# Patient Record
Sex: Female | Born: 1935 | Race: White | Hispanic: No | Smoking: Never smoker
Health system: Southern US, Community
[De-identification: ages and names within clinical notes are randomized; demographics above are authoritative.]

## PROBLEM LIST (undated history)

## (undated) DIAGNOSIS — Q249 Congenital malformation of heart, unspecified: Secondary | ICD-10-CM

## (undated) DIAGNOSIS — F329 Major depressive disorder, single episode, unspecified: Secondary | ICD-10-CM

## (undated) DIAGNOSIS — R001 Bradycardia, unspecified: Secondary | ICD-10-CM

## (undated) DIAGNOSIS — R471 Dysarthria and anarthria: Secondary | ICD-10-CM

## (undated) DIAGNOSIS — M1990 Unspecified osteoarthritis, unspecified site: Secondary | ICD-10-CM

## (undated) DIAGNOSIS — F32A Depression, unspecified: Secondary | ICD-10-CM

## (undated) DIAGNOSIS — H3530 Unspecified macular degeneration: Secondary | ICD-10-CM

## (undated) DIAGNOSIS — F419 Anxiety disorder, unspecified: Secondary | ICD-10-CM

## (undated) DIAGNOSIS — E785 Hyperlipidemia, unspecified: Secondary | ICD-10-CM

## (undated) DIAGNOSIS — I639 Cerebral infarction, unspecified: Secondary | ICD-10-CM

## (undated) DIAGNOSIS — J309 Allergic rhinitis, unspecified: Secondary | ICD-10-CM

## (undated) DIAGNOSIS — R1314 Dysphagia, pharyngoesophageal phase: Secondary | ICD-10-CM

## (undated) DIAGNOSIS — I10 Essential (primary) hypertension: Secondary | ICD-10-CM

## (undated) DIAGNOSIS — E876 Hypokalemia: Secondary | ICD-10-CM

## (undated) DIAGNOSIS — M5430 Sciatica, unspecified side: Secondary | ICD-10-CM

## (undated) DIAGNOSIS — G319 Degenerative disease of nervous system, unspecified: Secondary | ICD-10-CM

## (undated) DIAGNOSIS — G459 Transient cerebral ischemic attack, unspecified: Secondary | ICD-10-CM

## (undated) DIAGNOSIS — R413 Other amnesia: Secondary | ICD-10-CM

## (undated) DIAGNOSIS — I440 Atrioventricular block, first degree: Secondary | ICD-10-CM

## (undated) DIAGNOSIS — I679 Cerebrovascular disease, unspecified: Secondary | ICD-10-CM

## (undated) HISTORY — DX: Allergic rhinitis, unspecified: J30.9

## (undated) HISTORY — DX: Sciatica, unspecified side: M54.30

## (undated) HISTORY — DX: Hyperlipidemia, unspecified: E78.5

## (undated) HISTORY — DX: Bradycardia, unspecified: R00.1

## (undated) HISTORY — DX: Essential (primary) hypertension: I10

## (undated) HISTORY — DX: Atrioventricular block, first degree: I44.0

## (undated) HISTORY — DX: Major depressive disorder, single episode, unspecified: F32.9

## (undated) HISTORY — DX: Dysarthria and anarthria: R47.1

## (undated) HISTORY — DX: Dysphagia, pharyngoesophageal phase: R13.14

## (undated) HISTORY — DX: Hypokalemia: E87.6

## (undated) HISTORY — DX: Unspecified osteoarthritis, unspecified site: M19.90

## (undated) HISTORY — DX: Cerebrovascular disease, unspecified: I67.9

## (undated) HISTORY — DX: Other amnesia: R41.3

## (undated) HISTORY — DX: Cerebral infarction, unspecified: I63.9

## (undated) HISTORY — DX: Transient cerebral ischemic attack, unspecified: G45.9

## (undated) HISTORY — DX: Degenerative disease of nervous system, unspecified: G31.9

## (undated) HISTORY — DX: Unspecified macular degeneration: H35.30

## (undated) HISTORY — DX: Depression, unspecified: F32.A

## (undated) HISTORY — DX: Anxiety disorder, unspecified: F41.9

## (undated) HISTORY — DX: Congenital malformation of heart, unspecified: Q24.9

---
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
## 1960-10-17 HISTORY — PX: ABDOMINAL HYSTERECTOMY: SHX81

## 2003-10-18 HISTORY — PX: COLONOSCOPY: SHX174

## 2011-12-16 HISTORY — PX: CATARACT EXTRACTION W/ INTRAOCULAR LENS IMPLANT: SHX1309

## 2012-06-11 ENCOUNTER — Encounter: Payer: Self-pay | Admitting: Family

## 2012-06-11 ENCOUNTER — Ambulatory Visit (INDEPENDENT_AMBULATORY_CARE_PROVIDER_SITE_OTHER): Payer: Medicare Other | Admitting: Family

## 2012-06-11 VITALS — BP 136/82 | HR 68 | Ht <= 58 in | Wt 150.0 lb

## 2012-06-11 DIAGNOSIS — I10 Essential (primary) hypertension: Secondary | ICD-10-CM

## 2012-06-11 DIAGNOSIS — E785 Hyperlipidemia, unspecified: Secondary | ICD-10-CM

## 2012-06-11 DIAGNOSIS — R609 Edema, unspecified: Secondary | ICD-10-CM

## 2012-06-11 LAB — CBC WITH DIFFERENTIAL/PLATELET
Basophils Absolute: 0 10*3/uL (ref 0.0–0.1)
HCT: 40.7 % (ref 36.0–46.0)
Lymphs Abs: 2.6 10*3/uL (ref 0.7–4.0)
Monocytes Relative: 6.5 % (ref 3.0–12.0)
Platelets: 173 10*3/uL (ref 150.0–400.0)
RDW: 14.1 % (ref 11.5–14.6)

## 2012-06-11 LAB — POCT URINALYSIS DIPSTICK
Bilirubin, UA: NEGATIVE
Ketones, UA: NEGATIVE
Protein, UA: NEGATIVE
Spec Grav, UA: <=1.005
pH, UA: 5.5

## 2012-06-11 LAB — COMPREHENSIVE METABOLIC PANEL
ALT: 16 U/L (ref 0–35)
BUN: 17 mg/dL (ref 6–23)
CO2: 22 mEq/L (ref 19–32)
Calcium: 9.5 mg/dL (ref 8.4–10.5)
Chloride: 109 mEq/L (ref 96–112)
Creatinine, Ser: 0.9 mg/dL (ref 0.4–1.2)
GFR: 63.34 mL/min (ref >60.00)
Glucose, Bld: 96 mg/dL (ref 70–99)
Total Bilirubin: 0.4 mg/dL (ref 0.3–1.2)

## 2012-06-11 NOTE — Progress Notes (Signed)
Subjective:    Patient ID: Alice Swanson, female    DOB: [birth year], 78 y.o.   MRN: [id number]  HPI 78 year old white female, nonsmoker, new patient to the practice is in to be established. She has recently relocated from [place] to be close to her family. She has a history of hypertension and hyperlipidemia. She currently takes amlodipine, losartan, and simvastatin. She's tolerating all of her medications well. She occasionally takes Claritin over-the-counter for allergies. Last blood work was done July 2013 and was normal. Her immunizations are up-to-date per patient, she is declining a mammogram, colonoscopy was about 5 years ago.  She has concerns today of swelling in her feet x1 month. Denies any increase in her sodium intake. Does not routinely monitor her sodium. She denies any chest pain, palpitations, shortness of breath or edema.   Review of Systems  Constitutional: Negative.   HENT: Negative.   Eyes: Negative.   Respiratory: Negative.  Negative for shortness of breath.   Cardiovascular: Positive for leg swelling. Negative for chest pain and palpitations.  Gastrointestinal: Negative.   Genitourinary: Negative.   Musculoskeletal: Negative.   Skin: Negative.   Neurological: Negative.   Hematological: Negative.   Psychiatric/Behavioral: Negative.    Past Medical History  Diagnosis Date  . Arthritis   . Depression   . Glaucoma   . Hyperlipidemia   . Hypertension   . Cardiac arrhythmia due to congenital heart disease     History   Social History  . Marital Status: Unknown    Spouse Name: N/A    Number of Children: N/A  . Years of Education: N/A   Occupational History  . Not on file.   Social History Main Topics  . Smoking status: Never Smoker   . Smokeless tobacco: Not on file  . Alcohol Use: No  . Drug Use: No  . Sexually Active: Not on file   Other Topics Concern  . Not on file   Social History Narrative  . No narrative on file    Past Surgical  History  Procedure Date  . Appendectomy   . Abdominal hysterectomy   . Tonsillectomy and adenoidectomy     No family history on file.  Allergies  Allergen Reactions  . Penicillins     Current Outpatient Prescriptions on File Prior to Visit  Medication Sig Dispense Refill  . amLODipine (NORVASC) 10 MG tablet Take 10 mg by mouth daily.      . Calcium Carbonate-Vitamin D (CALCIUM 600+D) 600-200 MG-UNIT TABS Take 1 tablet by mouth 2 (two) times daily.      Marland Kitchen loratadine (CLARITIN) 10 MG tablet Take 10 mg by mouth daily.      Marland Kitchen losartan (COZAAR) 100 MG tablet Take 100 mg by mouth daily.      . simvastatin (ZOCOR) 20 MG tablet Take 20 mg by mouth every evening.        BP 136/82  Pulse 68  Ht 4\' 10"  (1.473 m)  Wt 150 lb (68.04 kg)  BMI 31.35 kg/m2  SpO2 95%chart    Objective:   Physical Exam  Constitutional: She is oriented to person, place, and time. She appears well-developed and well-nourished.  HENT:  Right Ear: External ear normal.  Left Ear: External ear normal.  Nose: Nose normal.  Mouth/Throat: Oropharynx is clear and moist.  Neck: Normal range of motion. Neck supple. No thyromegaly present.  Cardiovascular: Normal rate, regular rhythm and normal heart sounds.   Pulmonary/Chest: Effort normal and breath sounds normal.  Abdominal: Soft. Bowel sounds are normal.  Musculoskeletal: Normal range of motion.  Neurological: She is alert and oriented to person, place, and time. She has normal reflexes.  Skin: Skin is warm and dry.  Psychiatric: She has a normal mood and affect.          Assessment & Plan:  Assessment: Hypertension, Hyperlipidemia, Peripheral Edema-likely related to sodium retention, allergic rhinitis  Plan: Lab sent to include UA, CMP, TSH notify patient pending results. Will return for a fasting appointment in 3-4 months and we'll check her cholesterol. Encouraged healthy diet, exercise, low sodium. We'll consider a trial of Lasix if her labs are  normal. Patient to call the office with any questions or concerns prior to her next office visit. Recheck a schedule and as discussed.

## 2012-06-11 NOTE — Patient Instructions (Addendum)
Peripheral Edema You have swelling in your legs (peripheral edema). This swelling is due to excess accumulation of salt and water in your body. Edema may be a sign of heart, kidney or liver disease, or a side effect of a medication. It may also be due to problems in the leg veins. Elevating your legs and using special support stockings may be very helpful, if the cause of the swelling is due to poor venous circulation. Avoid long periods of standing, whatever the cause. Treatment of edema depends on identifying the cause. Chips, pretzels, pickles and other salty foods should be avoided. Restricting salt in your diet is almost always needed. Water pills (diuretics) are often used to remove the excess salt and water from your body via urine. These medicines prevent the kidney from reabsorbing sodium. This increases urine flow. Diuretic treatment may also result in lowering of potassium levels in your body. Potassium supplements may be needed if you have to use diuretics daily. Daily weights can help you keep track of your progress in clearing your edema. You should call your caregiver for follow up care as recommended. SEEK IMMEDIATE MEDICAL CARE IF:   You have increased swelling, pain, redness, or heat in your legs.   You develop shortness of breath, especially when lying down.   You develop chest or abdominal pain, weakness, or fainting.   You have a fever.  Document Released: 11/10/2004 Document Revised: 09/22/2011 Document Reviewed: 10/21/2009 [facility] Patient Information 2012 [place], [place].  2 Gram Low Sodium Diet A 2 gram sodium diet restricts the amount of sodium in the diet to no more than 2 g or 2000 mg daily. Limiting the amount of sodium is often used to help lower blood pressure. It is important if you have heart, liver, or kidney problems. Many foods contain sodium for flavor and sometimes as a preservative. When the amount of sodium in a diet needs to be low, it is important to know  what to look for when choosing foods and drinks. The following includes some information and guidelines to help make it easier for you to adapt to a low sodium diet. QUICK TIPS  Do not add salt to food.   Avoid convenience items and fast food.   Choose unsalted snack foods.   Buy lower sodium products, often labeled as "lower sodium" or "no salt added."   Check food labels to learn how much sodium is in 1 serving.   When eating at a restaurant, ask that your food be prepared with less salt or none, if possible.  READING FOOD LABELS FOR SODIUM INFORMATION The nutrition facts label is a good place to find how much sodium is in foods. Look for products with no more than 500 to 600 mg of sodium per meal and no more than 150 mg per serving. Remember that 2 g = 2000 mg. The food label may also list foods as:  Sodium-free: Less than 5 mg in a serving.   Very low sodium: 35 mg or less in a serving.   Low-sodium: 140 mg or less in a serving.   Light in sodium: 50% less sodium in a serving. For example, if a food that usually has 300 mg of sodium is changed to become light in sodium, it will have 150 mg of sodium.   Reduced sodium: 25% less sodium in a serving. For example, if a food that usually has 400 mg of sodium is changed to reduced sodium, it will have 300 mg of  sodium.  CHOOSING FOODS Grains  Avoid: Salted crackers and snack items. Some cereals, including instant hot cereals. Bread stuffing and biscuit mixes. Seasoned rice or pasta mixes.   Choose: Unsalted snack items. Low-sodium cereals, oats, puffed wheat and rice, shredded wheat. English muffins and bread. Pasta.  Meats  Avoid: Salted, canned, smoked, spiced, pickled meats, including fish and poultry. Bacon, ham, sausage, cold cuts, hot dogs, anchovies.   Choose: Low-sodium canned tuna and salmon. Fresh or frozen meat, poultry, and fish.  Dairy  Avoid: Processed cheese and spreads. Cottage cheese. Buttermilk and condensed  milk. Regular cheese.   Choose: Milk. Low-sodium cottage cheese. Yogurt. Sour cream. Low-sodium cheese.  Fruits and Vegetables  Avoid: Regular canned vegetables. Regular canned tomato sauce and paste. Frozen vegetables in sauces. Olives. Rosita Fire. Relishes. Sauerkraut.   Choose: Low-sodium canned vegetables. Low-sodium tomato sauce and paste. Frozen or fresh vegetables. Fresh and frozen fruit.  Condiments  Avoid: Canned and packaged gravies. Worcestershire sauce. Tartar sauce. Barbecue sauce. Soy sauce. Steak sauce. Ketchup. Onion, garlic, and table salt. Meat flavorings and tenderizers.   Choose: Fresh and dried herbs and spices. Low-sodium varieties of mustard and ketchup. Lemon juice. Tabasco sauce. Horseradish.  SAMPLE 2 GRAM SODIUM MEAL PLAN Breakfast / Sodium (mg)  1 cup low-fat milk / 143 mg   2 slices whole-wheat toast / 270 mg   1 tbs heart-healthy margarine / 153 mg   1 hard-boiled egg / 139 mg   1 small orange / 0 mg  Lunch / Sodium (mg)  1 cup raw carrots / 76 mg    cup hummus / 298 mg   1 cup low-fat milk / 143 mg    cup red grapes / 2 mg   1 whole-wheat pita bread / 356 mg  Dinner / Sodium (mg)  1 cup whole-wheat pasta / 2 mg   1 cup low-sodium tomato sauce / 73 mg   3 oz lean ground beef / 57 mg   1 small side salad (1 cup raw spinach leaves,  cup cucumber,  cup yellow bell pepper) with 1 tsp olive oil and 1 tsp red wine vinegar / 25 mg  Snack / Sodium (mg)  1 container low-fat vanilla yogurt / 107 mg   3 graham cracker squares / 127 mg  Nutrient Analysis  Calories: 2033   Protein: 77 g   Carbohydrate: 282 g   Fat: 72 g   Sodium: 1971 mg  Document Released: 10/03/2005 Document Revised: 09/22/2011 Document Reviewed: 01/04/2010 [facility] Patient Information 2012 [place], [place].

## 2012-06-12 NOTE — Progress Notes (Signed)
Quick Note:    Letter mailed.  ______

## 2012-07-10 ENCOUNTER — Telehealth: Payer: Self-pay | Admitting: Family Medicine

## 2012-07-10 MED ORDER — LOSARTAN POTASSIUM 100 MG PO TABS: 100.0000 mg | ORAL_TABLET | Freq: Every day | ORAL | Status: DC

## 2012-07-10 NOTE — Telephone Encounter (Signed)
Pt needs refill on her losartan (COZAAR) 100 MG tablet Sent to [facility] on [address]. Pt is down to 3 pills. Thank you. Pt last seen 06/11/2012 as new pt.

## 2012-07-10 NOTE — Telephone Encounter (Signed)
Rx sent 

## 2012-08-01 ENCOUNTER — Telehealth: Payer: Self-pay | Admitting: Family

## 2012-08-01 NOTE — Telephone Encounter (Signed)
Patient calling to let MD know that she has resumed the Fluoxetine 10 mg po daily.  Feeling much better and will continue with the medication.  Would also like a psychiatric referral.  Has not established that since moving here from [place].

## 2012-08-01 NOTE — Telephone Encounter (Signed)
Caller: [name]/Patient; Patient Name: Alice Swanson; PCP: Adline Mango [facility]); Best Callback Phone Number: [phone]; Call regarding intermittent Depressed, onset 2 weeks.  Fluoxetine started on 10-14. Pt states, symptoms started after I moved from [place] and worsen after I went to a church service 2 weeks ago.  Pt complains of not sleeping, "gripping" in her stomach. Pt feels some better after starting Fluoxetine.  Pt is leaving for weekend trip on 10-17.  All emergent symptoms ruled out per Depression, see in 24 hrs due to increasing symptoms and taking medications as prescribed.  Pt would like Dr Orvan Falconer to know she is taking Fluoxetine again.  Feels she needs to see a Psychiatrist.  PLEASE REVIEW W/ MD AND HAVE HER FOLLOW UP W/ PT.

## 2012-08-02 NOTE — Telephone Encounter (Signed)
Pt aware that she needs to contact insurance company for a list of psychiatrists' covered by them

## 2012-08-09 ENCOUNTER — Ambulatory Visit: Payer: Medicare Other | Admitting: Family

## 2012-08-10 ENCOUNTER — Ambulatory Visit (INDEPENDENT_AMBULATORY_CARE_PROVIDER_SITE_OTHER): Payer: Medicare Other | Admitting: Family

## 2012-08-10 ENCOUNTER — Encounter: Payer: Self-pay | Admitting: Family

## 2012-08-10 VITALS — BP 120/82 | HR 107 | Temp 97.5°F | Wt 137.0 lb

## 2012-08-10 DIAGNOSIS — F419 Anxiety disorder, unspecified: Secondary | ICD-10-CM | POA: Insufficient documentation

## 2012-08-10 DIAGNOSIS — E7800 Pure hypercholesterolemia, unspecified: Secondary | ICD-10-CM

## 2012-08-10 DIAGNOSIS — J309 Allergic rhinitis, unspecified: Secondary | ICD-10-CM

## 2012-08-10 DIAGNOSIS — I10 Essential (primary) hypertension: Secondary | ICD-10-CM

## 2012-08-10 DIAGNOSIS — F329 Major depressive disorder, single episode, unspecified: Secondary | ICD-10-CM

## 2012-08-10 DIAGNOSIS — F32A Depression, unspecified: Secondary | ICD-10-CM | POA: Insufficient documentation

## 2012-08-10 DIAGNOSIS — F411 Generalized anxiety disorder: Secondary | ICD-10-CM

## 2012-08-10 MED ORDER — ESCITALOPRAM OXALATE 5 MG PO TABS: 5.0000 mg | ORAL_TABLET | Freq: Every day | ORAL | Status: DC

## 2012-08-10 NOTE — Progress Notes (Signed)
Subjective:    Patient ID: Alice Swanson, female    DOB: 1920/08/11, 78 y.o.   MRN: [id number]  HPI  78 year old white female, nonsmoker is in with complaints of depression. 2 months ago she was prescribed Prozac but she's been taken inconsistently. Patient reports that about 2 doses of the medication in the last 2 months. However, she's noticed herself being more sad, depressed, crying, losing weight. She's had a difficult time adjusting to being dependent on her children. Patient recently relocated here. 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 She has a appointment with ophthalmologist next month. Overall she just feels down. Has feelings of helplessness and hopelessness. She also has thoughts of death and dying but denies any intent to commit suicide.  Review of Systems  Constitutional: Negative.   Respiratory: Negative.   Cardiovascular: Negative.   Musculoskeletal: Negative.   Skin: Negative.   Neurological: Negative.   Hematological: Negative.   Psychiatric/Behavioral: Positive for disturbed wake/sleep cycle and agitation. Negative for suicidal ideas and self-injury. The patient is nervous/anxious.    Past Medical History  Diagnosis Date  . Arthritis   . Depression   . Glaucoma   . Hyperlipidemia   . Hypertension   . Cardiac arrhythmia due to congenital heart disease     History   Social History  . Marital Status: Unknown    Spouse Name: N/A    Number of Children: N/A  . Years of Education: N/A   Occupational History  . Not on file.   Social History Main Topics  . Smoking status: Never Smoker   . Smokeless tobacco: Not on file  . Alcohol Use: No  . Drug Use: No  . Sexually Active: Not on file   Other Topics Concern  . Not on file   Social History Narrative  . No narrative on file    Past Surgical History  Procedure Date  . Appendectomy   . Abdominal hysterectomy   . Tonsillectomy and  adenoidectomy     No family history on file.  Allergies  Allergen Reactions  . Penicillins     Current Outpatient Prescriptions on File Prior to Visit  Medication Sig Dispense Refill  . acetaminophen (TYLENOL) 325 MG tablet Take 650 mg by mouth as needed.      Marland Kitchen amLODipine (NORVASC) 10 MG tablet Take 10 mg by mouth daily.      Marland Kitchen aspirin 81 MG tablet Take 81 mg by mouth daily.      . Calcium Carbonate-Vitamin D (CALCIUM 600+D) 600-200 MG-UNIT TABS Take 1 tablet by mouth 2 (two) times daily.      Marland Kitchen latanoprost (XALATAN) 0.005 % ophthalmic solution Place 1 drop into both eyes at bedtime.      Marland Kitchen loratadine (CLARITIN) 10 MG tablet Take 10 mg by mouth daily.      Marland Kitchen losartan (COZAAR) 100 MG tablet Take 1 tablet (100 mg total) by mouth daily.  90 tablet  0  . polyethylene glycol (MIRALAX / GLYCOLAX) packet Take 17 g by mouth as needed.      . simvastatin (ZOCOR) 20 MG tablet Take 20 mg by mouth every evening.      . escitalopram (LEXAPRO) 5 MG tablet Take 1 tablet (5 mg total) by mouth daily.  30 tablet  3    BP 120/82  Pulse 107  Temp 97.5 F (36.4 C) (Oral)  Wt 137 lb (62.143  kg)  SpO2 98%chart    Objective:   Physical Exam  Constitutional: She is oriented to person, place, and time. She appears well-developed and well-nourished.  Cardiovascular: Normal rate, regular rhythm and normal heart sounds.   Pulmonary/Chest: Effort normal and breath sounds normal.  Neurological: She is alert and oriented to person, place, and time.  Skin: Skin is warm and dry.  Psychiatric:       Tearful, flat affect          Assessment & Plan:  Assessment: Depression, anxiety, hypertension   Plan: DC Prozac. Start Lexapro 5 mg once daily. Encouraged psychotherapy with Judithe Modest. Patient to call the office if symptoms worsen or persist. Will recheck in 2-3 weeks and sooner when necessary. Encouraged medication compliance.

## 2012-08-10 NOTE — Patient Instructions (Addendum)
Judithe Modest: contact for psychotherapy  Depression, Adult Depression refers to feeling sad, low, down in the dumps, blue, gloomy, or empty. In general, there are two kinds of depression: 1. Depression that we all experience from time to time because of upsetting life experiences, including the loss of a job or the ending of a relationship (normal sadness or normal grief). This kind of depression is considered normal, is short lived, and resolves within a few days to 2 weeks. (Depression experienced after the loss of a loved one is called bereavement. Bereavement often lasts longer than 2 weeks but normally gets better with time.) 2. Clinical depression, which lasts longer than normal sadness or normal grief or interferes with your ability to function at home, at work, and in school. It also interferes with your personal relationships. It affects almost every aspect of your life. Clinical depression is an illness. Symptoms of depression also can be caused by conditions other than normal sadness and grief or clinical depression. Examples of these conditions are listed as follows:  Physical illness Some physical illnesses, including underactive thyroid gland (hypothyroidism), severe anemia, specific types of cancer, diabetes, uncontrolled seizures, heart and lung problems, strokes, and chronic pain are commonly associated with symptoms of depression.  Side effects of some prescription medicine In some people, certain types of prescription medicine can cause symptoms of depression.  Substance abuse Abuse of alcohol and illicit drugs can cause symptoms of depression. SYMPTOMS Symptoms of normal sadness and normal grief include the following:  Feeling sad or crying for short periods of time.  Not caring about anything (apathy).  Difficulty sleeping or sleeping too much.  No longer able to enjoy the things you used to enjoy.  Desire to be by oneself all the time (social isolation).  Lack of energy  or motivation.  Difficulty concentrating or remembering.  Change in appetite or weight.  Restlessness or agitation. Symptoms of clinical depression include the same symptoms of normal sadness or normal grief and also the following symptoms:  Feeling sad or crying all the time.  Feelings of guilt or worthlessness.  Feelings of hopelessness or helplessness.  Thoughts of suicide or the desire to harm yourself (suicidal ideation).  Loss of touch with reality (psychotic symptoms). Seeing or hearing things that are not real (hallucinations) or having false beliefs about your life or the people around you (delusions and paranoia). DIAGNOSIS  The diagnosis of clinical depression usually is based on the severity and duration of the symptoms. Your caregiver also will ask you questions about your medical history and substance use to find out if physical illness, use of prescription medicine, or substance abuse is causing your depression. Your caregiver also may order blood tests. TREATMENT  Typically, normal sadness and normal grief do not require treatment. However, sometimes antidepressant medicine is prescribed for bereavement to ease the depressive symptoms until they resolve. The treatment for clinical depression depends on the severity of your symptoms but typically includes antidepressant medicine, counseling with a mental health professional, or a combination of both. Your caregiver will help to determine what treatment is best for you. Depression caused by physical illness usually goes away with appropriate medical treatment of the illness. If prescription medicine is causing depression, talk with your caregiver about stopping the medicine, decreasing the dose, or substituting another medicine. Depression caused by abuse of alcohol or illicit drugs abuse goes away with abstinence from these substances. Some adults need professional help in order to stop drinking or using drugs. SEEK  IMMEDIATE  CARE IF:  You have thoughts about hurting yourself or others.  You lose touch with reality (have psychotic symptoms).  You are taking medicine for depression and have a serious side effect. FOR MORE INFORMATION National [employer] on Mental Illness: www.nami.[employer]: http://www.maynard.net/ Document Released: 09/30/2000 Document Revised: 04/03/2012 Document Reviewed: 01/02/2012 [facility] Patient Information 2013 [place], [place].

## 2012-08-13 ENCOUNTER — Ambulatory Visit: Payer: Medicare Other | Admitting: Licensed Clinical Social Worker

## 2012-08-17 ENCOUNTER — Ambulatory Visit (INDEPENDENT_AMBULATORY_CARE_PROVIDER_SITE_OTHER): Payer: Medicare Other | Admitting: Licensed Clinical Social Worker

## 2012-08-17 DIAGNOSIS — F331 Major depressive disorder, recurrent, moderate: Secondary | ICD-10-CM

## 2012-08-24 ENCOUNTER — Ambulatory Visit (INDEPENDENT_AMBULATORY_CARE_PROVIDER_SITE_OTHER): Payer: Medicare Other | Admitting: Family

## 2012-08-24 ENCOUNTER — Encounter: Payer: Self-pay | Admitting: Family

## 2012-08-24 VITALS — BP 112/68 | HR 100 | Temp 99.2°F | Wt 134.0 lb

## 2012-08-24 DIAGNOSIS — F329 Major depressive disorder, single episode, unspecified: Secondary | ICD-10-CM

## 2012-08-24 DIAGNOSIS — F411 Generalized anxiety disorder: Secondary | ICD-10-CM

## 2012-08-24 DIAGNOSIS — F419 Anxiety disorder, unspecified: Secondary | ICD-10-CM

## 2012-08-24 DIAGNOSIS — K5900 Constipation, unspecified: Secondary | ICD-10-CM

## 2012-08-24 MED ORDER — ESCITALOPRAM OXALATE 10 MG PO TABS: 10.0000 mg | ORAL_TABLET | Freq: Every day | ORAL | Status: DC

## 2012-08-24 NOTE — Patient Instructions (Signed)

## 2012-08-24 NOTE — Progress Notes (Signed)
Subjective:    Patient ID: Alice Swanson, female    DOB: 02-12-1920, 78 y.o.   MRN: [id number]  HPI 78 year female, nonsmoker, is in today for a recheck of depression and anxiety. Overall, her mood has improved. Continues to have down days and feelings of sadness. Her appetite is decreased still, but slightly improved. Daughter believes she could use an increase in her Lexapro. Has feelings of helplessness and hopelessness. Has thoughts of dying related to her elderly age. But denies any intention or desire to commit suicide.   Has concerns of constipation. Reports a bowel movement approximately every other day. Has been drinking prune juice that was once effective, but no longer he is. She denies any blood in her stools are dark black stools. No palpable pain.   Review of Systems  Constitutional: Positive for appetite change and fatigue.  Respiratory: Negative.  Negative for shortness of breath.   Cardiovascular: Negative.  Negative for chest pain.  Gastrointestinal: Positive for constipation. Negative for nausea, vomiting, abdominal pain, diarrhea and anal bleeding.  Genitourinary: Negative.   Musculoskeletal: Negative.   Skin: Negative.   Neurological: Negative.   Hematological: Negative.   Psychiatric/Behavioral: Negative for suicidal ideas and confusion. The patient is nervous/anxious.    Past Medical History  Diagnosis Date  . Arthritis   . Depression   . Glaucoma   . Hyperlipidemia   . Hypertension   . Cardiac arrhythmia due to congenital heart disease     History   Social History  . Marital Status: Unknown    Spouse Name: N/A    Number of Children: N/A  . Years of Education: N/A   Occupational History  . Not on file.   Social History Main Topics  . Smoking status: Never Smoker   . Smokeless tobacco: Not on file  . Alcohol Use: No  . Drug Use: No  . Sexually Active: Not on file   Other Topics Concern  . Not on file   Social History Narrative  . No narrative  on file    Past Surgical History  Procedure Date  . Appendectomy   . Abdominal hysterectomy   . Tonsillectomy and adenoidectomy     No family history on file.  Allergies  Allergen Reactions  . Penicillins     Current Outpatient Prescriptions on File Prior to Visit  Medication Sig Dispense Refill  . acetaminophen (TYLENOL) 325 MG tablet Take 650 mg by mouth as needed.      Marland Kitchen amLODipine (NORVASC) 10 MG tablet Take 10 mg by mouth daily.      Marland Kitchen aspirin 81 MG tablet Take 81 mg by mouth daily.      . Calcium Carbonate-Vitamin D (CALCIUM 600+D) 600-200 MG-UNIT TABS Take 1 tablet by mouth 2 (two) times daily.      Marland Kitchen latanoprost (XALATAN) 0.005 % ophthalmic solution Place 1 drop into both eyes at bedtime.      Marland Kitchen loratadine (CLARITIN) 10 MG tablet Take 10 mg by mouth daily.      Marland Kitchen losartan (COZAAR) 100 MG tablet Take 1 tablet (100 mg total) by mouth daily.  90 tablet  0  . polyethylene glycol (MIRALAX / GLYCOLAX) packet Take 17 g by mouth as needed.      . simvastatin (ZOCOR) 20 MG tablet Take 20 mg by mouth every evening.      . [DISCONTINUED] escitalopram (LEXAPRO) 5 MG tablet Take 1 tablet (5 mg total) by mouth daily.  30 tablet  3  BP 112/68  Pulse 100  Temp 99.2 F (37.3 C) (Oral)  Wt 134 lb (60.782 kg)  SpO2 98%chart    Objective:   Physical Exam  Constitutional: She is oriented to person, place, and time. She appears well-developed and well-nourished.  HENT:  Right Ear: External ear normal.  Left Ear: External ear normal.  Mouth/Throat: Oropharynx is clear and moist.  Neck: Normal range of motion. Neck supple. No thyromegaly present.  Cardiovascular: Normal rate, regular rhythm and normal heart sounds.   Pulmonary/Chest: Effort normal and breath sounds normal.  Abdominal: Soft. Bowel sounds are normal. There is no tenderness. There is no rebound and no guarding.  Musculoskeletal: Normal range of motion.  Neurological: She is alert and oriented to person, place, and  time.  Skin: Skin is warm and dry.  Psychiatric: She has a normal mood and affect.          Assessment & Plan:  Assessment: Depression, Anxiety, Decreased Appetite, and constipation  Plan: Increase Lexapro to 10mg  a day. Encouraged her to get up and out of the bed daily, get a shower. Home health suggested and family will think about it. MiraLax daily to help with constipation. Increase fluid intake. Increase mobility. Patient call the office if symptoms worsen or persist. Recheck a schedule, when necessary.

## 2012-08-27 ENCOUNTER — Ambulatory Visit: Payer: Medicare Other | Admitting: Licensed Clinical Social Worker

## 2012-09-10 ENCOUNTER — Ambulatory Visit: Payer: Medicare Other | Admitting: Family

## 2012-09-17 ENCOUNTER — Encounter: Payer: Self-pay | Admitting: Family

## 2012-09-17 ENCOUNTER — Ambulatory Visit (INDEPENDENT_AMBULATORY_CARE_PROVIDER_SITE_OTHER): Payer: Medicare Other | Admitting: Family

## 2012-09-17 VITALS — BP 110/60 | HR 99 | Temp 97.9°F | Wt 134.0 lb

## 2012-09-17 DIAGNOSIS — F329 Major depressive disorder, single episode, unspecified: Secondary | ICD-10-CM

## 2012-09-17 DIAGNOSIS — F419 Anxiety disorder, unspecified: Secondary | ICD-10-CM

## 2012-09-17 DIAGNOSIS — F411 Generalized anxiety disorder: Secondary | ICD-10-CM

## 2012-09-17 MED ORDER — FLUOXETINE HCL 10 MG PO TABS: 10.0000 mg | ORAL_TABLET | Freq: Every day | ORAL | Status: DC

## 2012-09-17 NOTE — Progress Notes (Signed)
Subjective:    Patient ID: Alice Swanson, female    DOB: 13-Sep-1920, 78 y.o.   MRN: [id number]  HPI 78 year old white female, nonsmoker, is in for recheck of anxiety and depression. She is to take a Lexapro 10 mg once daily. Her depression has improved but continues to have anxiety. She continues to be in despair regarding her move to [facility] and getting older. She feels like a burden on her family.    Review of Systems  Constitutional: Negative.   Respiratory: Negative.   Cardiovascular: Negative.   Skin: Negative.   Psychiatric/Behavioral: Negative for suicidal ideas, sleep disturbance and self-injury. The patient is nervous/anxious.    Past Medical History  Diagnosis Date  . Arthritis   . Depression   . Glaucoma   . Hyperlipidemia   . Hypertension   . Cardiac arrhythmia due to congenital heart disease     History   Social History  . Marital Status: Unknown    Spouse Name: N/A    Number of Children: N/A  . Years of Education: N/A   Occupational History  . Not on file.   Social History Main Topics  . Smoking status: Never Smoker   . Smokeless tobacco: Not on file  . Alcohol Use: No  . Drug Use: No  . Sexually Active: Not on file   Other Topics Concern  . Not on file   Social History Narrative  . No narrative on file    Past Surgical History  Procedure Date  . Appendectomy   . Abdominal hysterectomy   . Tonsillectomy and adenoidectomy     No family history on file.  Allergies  Allergen Reactions  . Penicillins     Current Outpatient Prescriptions on File Prior to Visit  Medication Sig Dispense Refill  . acetaminophen (TYLENOL) 325 MG tablet Take 650 mg by mouth as needed.      Marland Kitchen amLODipine (NORVASC) 10 MG tablet Take 10 mg by mouth daily.      Marland Kitchen aspirin 81 MG tablet Take 81 mg by mouth daily.      . Calcium Carbonate-Vitamin D (CALCIUM 600+D) 600-200 MG-UNIT TABS Take 1 tablet by mouth 2 (two) times daily.      Marland Kitchen latanoprost (XALATAN) 0.005 % ophthalmic  solution Place 1 drop into both eyes at bedtime.      Marland Kitchen loratadine (CLARITIN) 10 MG tablet Take 10 mg by mouth daily.      Marland Kitchen losartan (COZAAR) 100 MG tablet Take 1 tablet (100 mg total) by mouth daily.  90 tablet  0  . polyethylene glycol (MIRALAX / GLYCOLAX) packet Take 17 g by mouth as needed.      . simvastatin (ZOCOR) 20 MG tablet Take 20 mg by mouth every evening.        BP 110/60  Pulse 99  Temp 97.9 F (36.6 C) (Oral)  Wt 134 lb (60.782 kg)  SpO2 92%chart    Objective:   Physical Exam  Constitutional: She appears well-developed and well-nourished.  HENT:  Right Ear: External ear normal.  Left Ear: External ear normal.  Nose: Nose normal.  Mouth/Throat: Oropharynx is clear and moist.  Neck: Normal range of motion. Neck supple. No thyromegaly present.  Cardiovascular: Normal rate and normal heart sounds.   Pulmonary/Chest: Effort normal and breath sounds normal.  Abdominal: Bowel sounds are normal.  Musculoskeletal: Normal range of motion.  Neurological: She is alert.  Skin: Skin is warm and dry.  Psychiatric: She has a normal mood and  affect.          Assessment & Plan:  Assessment: Anxiety and depression  Plan: DC Lexapro start fluoxetine 10 mg once daily. At next OV will obtain fasting labs. Call the office if symptoms worsen or persist. Recheck as scheduled.

## 2012-10-01 ENCOUNTER — Encounter (INDEPENDENT_AMBULATORY_CARE_PROVIDER_SITE_OTHER): Payer: Medicare Other | Admitting: Ophthalmology

## 2012-10-01 DIAGNOSIS — H3530 Unspecified macular degeneration: Secondary | ICD-10-CM

## 2012-10-01 DIAGNOSIS — H2510 Age-related nuclear cataract, unspecified eye: Secondary | ICD-10-CM

## 2012-10-01 DIAGNOSIS — H35039 Hypertensive retinopathy, unspecified eye: Secondary | ICD-10-CM

## 2012-10-01 DIAGNOSIS — I10 Essential (primary) hypertension: Secondary | ICD-10-CM

## 2012-10-01 DIAGNOSIS — H43819 Vitreous degeneration, unspecified eye: Secondary | ICD-10-CM

## 2012-10-03 ENCOUNTER — Telehealth: Payer: Self-pay | Admitting: Family

## 2012-10-03 MED ORDER — AMLODIPINE BESYLATE 10 MG PO TABS: 10.0000 mg | ORAL_TABLET | Freq: Every day | ORAL | Status: DC

## 2012-10-03 NOTE — Telephone Encounter (Signed)
Patient's daughter called stating that her mom needs her amlodipine 10mg  1poqd sent to [facility] on [address]. Please assist.

## 2012-10-03 NOTE — Telephone Encounter (Signed)
Done

## 2012-10-19 ENCOUNTER — Encounter: Payer: Self-pay | Admitting: Family

## 2012-10-19 ENCOUNTER — Ambulatory Visit (INDEPENDENT_AMBULATORY_CARE_PROVIDER_SITE_OTHER): Payer: Medicare Other | Admitting: Family

## 2012-10-19 ENCOUNTER — Ambulatory Visit: Payer: Medicare Other | Admitting: Family

## 2012-10-19 VITALS — BP 112/76 | HR 84 | Wt 132.0 lb

## 2012-10-19 DIAGNOSIS — E7800 Pure hypercholesterolemia, unspecified: Secondary | ICD-10-CM

## 2012-10-19 DIAGNOSIS — F329 Major depressive disorder, single episode, unspecified: Secondary | ICD-10-CM

## 2012-10-19 DIAGNOSIS — R413 Other amnesia: Secondary | ICD-10-CM

## 2012-10-19 LAB — CBC WITH DIFFERENTIAL/PLATELET
Eosinophils Relative: 1.3 % (ref 0.0–5.0)
HCT: 42.7 % (ref 36.0–46.0)
Hemoglobin: 14.2 g/dL (ref 12.0–15.0)
Lymphs Abs: 2.3 10*3/uL (ref 0.7–4.0)
MCV: 94.9 fl (ref 78.0–100.0)
Monocytes Absolute: 0.5 10*3/uL (ref 0.1–1.0)
Monocytes Relative: 5.2 % (ref 3.0–12.0)
Neutro Abs: 7 10*3/uL (ref 1.4–7.7)
Platelets: 202 10*3/uL (ref 150.0–400.0)
WBC: 10 10*3/uL (ref 4.5–10.5)

## 2012-10-19 LAB — HEPATIC FUNCTION PANEL
AST: 21 U/L (ref 0–37)
Albumin: 3.7 g/dL (ref 3.5–5.2)
Alkaline Phosphatase: 82 U/L (ref 39–117)
Total Bilirubin: 1 mg/dL (ref 0.3–1.2)

## 2012-10-19 LAB — BASIC METABOLIC PANEL
CO2: 24 mEq/L (ref 19–32)
Chloride: 106 mEq/L (ref 96–112)
Creatinine, Ser: 0.8 mg/dL (ref 0.4–1.2)
Sodium: 139 mEq/L (ref 135–145)

## 2012-10-19 LAB — TSH: TSH: 0.82 u[IU]/mL (ref 0.35–5.50)

## 2012-10-19 MED ORDER — FLUOXETINE HCL 10 MG PO TABS: 10.0000 mg | ORAL_TABLET | Freq: Every day | ORAL | Status: DC

## 2012-10-19 NOTE — Patient Instructions (Addendum)

## 2012-10-19 NOTE — Progress Notes (Signed)
Subjective:    Patient ID: Alice Swanson, female    DOB: 1919/12/27, 79 y.o.   MRN: [id number]  HPI 79 year old white female, nonsmoker is in for recheck of depression. She is currently on Prozac 10 mg once daily. Has less crying spells and is happier but continues to feel down as of the time. She is requesting that her family put her in an assisted living facility that would make her happier. Therefore, that is the plan. Reports having more dizziness recently. Her daughter has decreased her amlodipine from 10 mg to 5 mg but she continues to be dizzy. Blood pressure readings in the low teens systolically. Denies any chest pain, palpitations, shortness of breath or edema. Denies any feelings of helplessness, hopelessness.   Review of Systems  Constitutional: Negative.   HENT: Negative.   Respiratory: Negative.   Cardiovascular: Negative.   Gastrointestinal: Negative.   Musculoskeletal: Negative.   Skin: Negative.   Neurological: Negative.   Hematological: Negative.   Psychiatric/Behavioral: Negative for suicidal ideas and sleep disturbance. The patient is nervous/anxious.    Past Medical History  Diagnosis Date  . Arthritis   . Depression   . Glaucoma   . Hyperlipidemia   . Hypertension   . Cardiac arrhythmia due to congenital heart disease     History   Social History  . Marital Status: Unknown    Spouse Name: N/A    Number of Children: N/A  . Years of Education: N/A   Occupational History  . Not on file.   Social History Main Topics  . Smoking status: Never Smoker   . Smokeless tobacco: Not on file  . Alcohol Use: No  . Drug Use: No  . Sexually Active: Not on file   Other Topics Concern  . Not on file   Social History Narrative  . No narrative on file    Past Surgical History  Procedure Date  . Appendectomy   . Abdominal hysterectomy   . Tonsillectomy and adenoidectomy     No family history on file.  Allergies  Allergen Reactions  . Penicillins      Current Outpatient Prescriptions on File Prior to Visit  Medication Sig Dispense Refill  . acetaminophen (TYLENOL) 325 MG tablet Take 650 mg by mouth as needed.      Marland Kitchen amLODipine (NORVASC) 10 MG tablet Take 1 tablet (10 mg total) by mouth daily.  90 tablet  0  . aspirin 81 MG tablet Take 81 mg by mouth daily.      . Calcium Carbonate-Vitamin D (CALCIUM 600+D) 600-200 MG-UNIT TABS Take 1 tablet by mouth 2 (two) times daily.      Marland Kitchen latanoprost (XALATAN) 0.005 % ophthalmic solution Place 1 drop into both eyes at bedtime.      Marland Kitchen loratadine (CLARITIN) 10 MG tablet Take 10 mg by mouth daily.      Marland Kitchen losartan (COZAAR) 100 MG tablet Take 1 tablet (100 mg total) by mouth daily.  90 tablet  0  . polyethylene glycol (MIRALAX / GLYCOLAX) packet Take 17 g by mouth as needed.      . simvastatin (ZOCOR) 20 MG tablet Take 20 mg by mouth every evening.        BP 112/76  Pulse 84  Wt 132 lb (59.875 kg)  SpO2 98%chart    Objective:   Physical Exam  Constitutional: She is oriented to person, place, and time. She appears well-developed and well-nourished.  Neck: Normal range of motion. Neck supple.  Cardiovascular:  Normal rate, regular rhythm and normal heart sounds.   Pulmonary/Chest: Effort normal and breath sounds normal.  Abdominal: Soft. Bowel sounds are normal.  Neurological: She is alert and oriented to person, place, and time.  Skin: Skin is warm and dry.          Assessment & Plan:  Assessment: Hypotension, vertigo, depression  Plan: DC amlodipine. Continue current medications. Increase fluoxetine to 20 mg once daily. Recheck patient in one month and sooner when necessary. was in

## 2012-10-24 ENCOUNTER — Other Ambulatory Visit (INDEPENDENT_AMBULATORY_CARE_PROVIDER_SITE_OTHER): Payer: Medicare Other

## 2012-10-24 ENCOUNTER — Other Ambulatory Visit: Payer: Medicare Other

## 2012-10-24 DIAGNOSIS — E7800 Pure hypercholesterolemia, unspecified: Secondary | ICD-10-CM

## 2012-10-24 LAB — LIPID PANEL
HDL: 50.9 mg/dL (ref >39.00)
Total CHOL/HDL Ratio: 4
Triglycerides: 127 mg/dL (ref 0.0–149.0)
VLDL: 25.4 mg/dL (ref 0.0–40.0)

## 2012-10-25 LAB — LDL CHOLESTEROL, DIRECT: Direct LDL: 130.7 mg/dL

## 2012-10-31 ENCOUNTER — Other Ambulatory Visit: Payer: Self-pay | Admitting: Family

## 2012-10-31 NOTE — Telephone Encounter (Signed)
Pt is now taking fluoxetine 20 mg. This med was increase on 10-19-2012. [facility] [address]. Pt daughter saw NP in hall way in our office on 10-19-2012 and per daughter NP told her it's  Ok to increase from 10 mg to 20 mg. Daughter also needs blood work results.

## 2012-11-01 ENCOUNTER — Other Ambulatory Visit: Payer: Self-pay | Admitting: Family

## 2012-11-01 MED ORDER — FLUOXETINE HCL 20 MG PO TABS: 20.0000 mg | ORAL_TABLET | Freq: Every day | ORAL | Status: DC

## 2012-11-01 NOTE — Telephone Encounter (Signed)
Pt's daughter aware and rx sent to pharmacy

## 2013-01-04 ENCOUNTER — Ambulatory Visit (INDEPENDENT_AMBULATORY_CARE_PROVIDER_SITE_OTHER): Payer: Medicare Other

## 2013-01-04 DIAGNOSIS — Z111 Encounter for screening for respiratory tuberculosis: Secondary | ICD-10-CM

## 2013-01-04 DIAGNOSIS — A35 Other tetanus: Secondary | ICD-10-CM

## 2013-01-04 DIAGNOSIS — Z23 Encounter for immunization: Secondary | ICD-10-CM

## 2013-01-07 ENCOUNTER — Telehealth: Payer: Self-pay | Admitting: Family

## 2013-01-07 LAB — TB SKIN TEST
Induration: 0 mm
TB Skin Test: NEGATIVE

## 2013-01-07 NOTE — Telephone Encounter (Signed)
Pt's daughter concerned about assisted living box being checked on pt's form. Pt is going into independent living not assisted living. Advised that we can correct it if she will bring it back to the office

## 2013-01-07 NOTE — Telephone Encounter (Signed)
Patient's daughter called stating that she would like a call back from the nurse concerning form that was completed. Her office number is [phone]. Please assist.

## 2013-01-08 ENCOUNTER — Telehealth: Payer: Self-pay | Admitting: Family

## 2013-01-08 NOTE — Telephone Encounter (Addendum)
Pt daughter [name] is requesting Bland Span to return her call concerning a form.

## 2013-01-08 NOTE — Telephone Encounter (Signed)
Pt's daughter aware forms ready for pick up 

## 2013-02-09 ENCOUNTER — Other Ambulatory Visit: Payer: Self-pay | Admitting: Family

## 2013-02-14 HISTORY — PX: CATARACT EXTRACTION W/ INTRAOCULAR LENS IMPLANT: SHX1309

## 2013-02-23 ENCOUNTER — Other Ambulatory Visit: Payer: Self-pay | Admitting: Family

## 2013-05-02 ENCOUNTER — Non-Acute Institutional Stay: Payer: Medicare Other | Admitting: Nurse Practitioner

## 2013-05-02 ENCOUNTER — Encounter: Payer: Self-pay | Admitting: Nurse Practitioner

## 2013-05-02 VITALS — BP 158/60 | HR 68 | Temp 97.7°F | Ht <= 58 in | Wt 121.0 lb

## 2013-05-02 DIAGNOSIS — F329 Major depressive disorder, single episode, unspecified: Secondary | ICD-10-CM

## 2013-05-02 DIAGNOSIS — K644 Residual hemorrhoidal skin tags: Secondary | ICD-10-CM

## 2013-05-02 DIAGNOSIS — J309 Allergic rhinitis, unspecified: Secondary | ICD-10-CM

## 2013-05-02 DIAGNOSIS — M5430 Sciatica, unspecified side: Secondary | ICD-10-CM

## 2013-05-02 DIAGNOSIS — I10 Essential (primary) hypertension: Secondary | ICD-10-CM

## 2013-05-02 DIAGNOSIS — E7800 Pure hypercholesterolemia, unspecified: Secondary | ICD-10-CM

## 2013-05-02 DIAGNOSIS — M5431 Sciatica, right side: Secondary | ICD-10-CM

## 2013-05-05 DIAGNOSIS — K644 Residual hemorrhoidal skin tags: Secondary | ICD-10-CM | POA: Insufficient documentation

## 2013-05-05 DIAGNOSIS — M5430 Sciatica, unspecified side: Secondary | ICD-10-CM | POA: Insufficient documentation

## 2013-05-05 NOTE — Assessment & Plan Note (Signed)
The right sided pain travel down to posterior thigh and knee--prn Ibuprofen helps. Not disabling-ambulates with walker.

## 2013-05-05 NOTE — Assessment & Plan Note (Signed)
No inflammation or injury.

## 2013-05-05 NOTE — Progress Notes (Signed)
Patient ID: Alice Swanson, female   DOB: 1920-06-10, 79 y.o.   MRN: [id number] Location of Service Clinic [facility]  Allergies  Allergen Reactions  . Penicillins     Chief Complaint  Patient presents with  . Medical Managment of Chronic Issues    New Patient blood pressure, depression, sciatica    HPI: Patient is a 79 y.o. female seen in the clinic at [facility] today for evaluation of her chronic medical conditions and new patient establishment.  Problem List Items Addressed This Visit   Allergic rhinitis     Managed with Loratadine prn.     Depression     Tearful, angry outburst, very low tolerance in general, argumentative at today's visit. She stated that she has lived too long and asked me I can prescribe medication to help her to end her life. Also she said it is the problem that her physical health is reasonable good. She admitted that she has lost weight and her appetite has been poor since she moved from [place] to [place] a year ago, especially since [place] has moved into [place] apartment @ [facility] from her Apartment @ [place] 2 months ago. The patient admitted that she is unreasonable. She declined increasing her Prozac for her depression management. Will obtain Hgb A1c, TSH, CBC, and CMP. EKG    Hemorrhoids, external     No inflammation or injury.     Hypercholesteremia     Not taking cholesterol lowering agent--will check Lipid panel.     Hypertension - Primary     Stopped taking Losartan 100mg  daily. Says she is asymptomatic. The patient was instructed to monitor her blood pressure closely.     Sciatic pain     The right sided pain travel down to posterior thigh and knee--prn Ibuprofen helps. Not disabling-ambulates with walker.        Review of Systems:  Review of Systems  Constitutional: Positive for weight loss and diaphoresis (expressive perspiring easily). Negative for fever, chills and malaise/fatigue.  HENT: Positive for hearing loss. Negative for ear pain, nosebleeds,  congestion, sore throat, neck pain, tinnitus and ear discharge.   Eyes: Negative for blurred vision, double vision, photophobia, pain, discharge and redness.  Respiratory: Negative for cough, hemoptysis, sputum production, shortness of breath, wheezing and stridor.   Cardiovascular: Negative for chest pain, palpitations, orthopnea, claudication, leg swelling and PND.  Gastrointestinal: Negative for heartburn, nausea, vomiting, abdominal pain, diarrhea, constipation, blood in stool and melena.  Genitourinary: Negative for dysuria, urgency, frequency, hematuria and flank pain.  Musculoskeletal: Positive for back pain (right sided sciatic pain). Negative for myalgias, joint pain and falls.  Skin: Negative for itching and rash.  Neurological: Negative for dizziness, tingling, tremors, sensory change, speech change, focal weakness, seizures, loss of consciousness, weakness and headaches.  Endo/Heme/Allergies: Negative for environmental allergies and polydipsia. Does not bruise/bleed easily.  Psychiatric/Behavioral: Positive for depression. Negative for suicidal ideas, hallucinations, memory loss and substance abuse. The patient is nervous/anxious. The patient does not have insomnia.      Past Medical History  Diagnosis Date  . Arthritis   . Depression   . Glaucoma     both eyes  . Hyperlipidemia   . Hypertension   . Cardiac arrhythmia due to congenital heart disease   . Sciatica   . Anxiety   . Allergic rhinitis   . Macular degeneration of both eyes    Past Surgical History  Procedure Laterality Date  . Tonsillectomy and adenoidectomy  1926  .  Appendectomy  1932  . Abdominal hysterectomy  1962  . Cataract extraction w/ intraocular lens implant Right 12/2011    Dr. Pricilla Holm  . Cataract extraction w/ intraocular lens implant Left 02/2013    Dr. Dione Booze  . Colonoscopy  2005   Social History:   reports that she has never smoked. She has never used smokeless tobacco. She reports that she does  not drink alcohol or use illicit drugs.  Family History  Problem Relation Age of Onset  . Heart disease Mother     MI  . Stroke Father     Medications: Patient's Medications  New Prescriptions   No medications on file  Previous Medications   ACETAMINOPHEN (TYLENOL) 325 MG TABLET    Take 650 mg by mouth as needed.   AMLODIPINE (NORVASC) 10 MG TABLET    Take 1 tablet (10 mg total) by mouth daily.   ASPIRIN 81 MG TABLET    Take 81 mg by mouth daily.   CALCIUM CARBONATE-VITAMIN D (CALCIUM 600+D) 600-200 MG-UNIT TABS    Take 1 tablet by mouth 2 (two) times daily.   FLUOXETINE (PROZAC) 20 MG TABLET    TAKE 1 TABLET (20 MG TOTAL) BY MOUTH DAILY.   IBUPROFEN (ADVIL,MOTRIN) 200 MG TABLET    Take 200 mg by mouth. Take 1-2 tablets as needed   LATANOPROST (XALATAN) 0.005 % OPHTHALMIC SOLUTION    Place 1 drop into both eyes at bedtime.   LORATADINE (CLARITIN) 10 MG TABLET    Take 10 mg by mouth daily.   LOSARTAN (COZAAR) 100 MG TABLET    TAKE 1 TABLET BY MOUTH EVERY DAY   MULTIPLE VITAMINS-MINERALS (ICAPS) CAPS    Take by mouth. Take 2 daily   POLYETHYLENE GLYCOL (MIRALAX / GLYCOLAX) PACKET    Take 17 g by mouth as needed.   SIMVASTATIN (ZOCOR) 20 MG TABLET    Take 20 mg by mouth every evening.  Modified Medications   No medications on file  Discontinued Medications   No medications on file     Physical Exam: Physical Exam  Constitutional: She is oriented to person, place, and time. She appears well-developed and well-nourished. No distress.  HENT:  Head: Normocephalic and atraumatic.  Right Ear: External ear normal.  Left Ear: External ear normal.  Nose: Nose normal.  Mouth/Throat: Oropharynx is clear and moist. No oropharyngeal exudate.  Eyes: Conjunctivae and EOM are normal. Pupils are equal, round, and reactive to light. Right eye exhibits no discharge. Left eye exhibits no discharge. No scleral icterus.  Neck: Normal range of motion. Neck supple. No JVD present. No tracheal  deviation present. No thyromegaly present.  Cardiovascular: Normal rate, regular rhythm, normal heart sounds and intact distal pulses.   No murmur heard. Pulmonary/Chest: Effort normal and breath sounds normal. No stridor. No respiratory distress. She has no wheezes. She has no rales. She exhibits no tenderness.  Abdominal: Soft. Bowel sounds are normal. She exhibits no distension. There is no tenderness. There is no rebound and no guarding.  Genitourinary: Vagina normal. Guaiac negative stool. No vaginal discharge found.  External hemorrhoids-stable.   Musculoskeletal: Normal range of motion. She exhibits no edema and no tenderness.  Lymphadenopathy:    She has no cervical adenopathy.  Neurological: She is alert and oriented to person, place, and time. She displays normal reflexes. No cranial nerve deficit. She exhibits normal muscle tone. Coordination normal.  Skin: Skin is warm and dry. No rash noted. She is not diaphoretic. No erythema. No  pallor.  Psychiatric: Her mood appears anxious. Her affect is angry, blunt, labile and inappropriate. Her speech is not rapid and/or pressured, not delayed, not tangential and not slurred. She is agitated, aggressive and hyperactive. She is not slowed, not withdrawn, not actively hallucinating and not combative. Thought content is not paranoid and not delusional. Cognition and memory are not impaired. She expresses impulsivity and inappropriate judgment. She exhibits a depressed mood. She expresses no homicidal and no suicidal ideation. She expresses no suicidal plans and no homicidal plans. She is communicative. She exhibits normal recent memory and normal remote memory. She is attentive.    Filed Vitals:   05/02/13 1617  BP: 158/60  Pulse: 68  Temp: 97.7 F (36.5 C)  TempSrc: Oral  Height: 4' 9.5" (1.461 m)  Weight: 121 lb (54.885 kg)      Labs reviewed: Basic Metabolic Panel:  Recent Labs  54/09/81 0927 10/19/12 1012  NA 142 139  K 4.9  3.8  CL 109 106  CO2 22 24  GLUCOSE 96 112*  BUN 17 22  CREATININE 0.9 0.8  CALCIUM 9.5 9.3  TSH 1.77 0.82   Liver Function Tests:  Recent Labs  06/11/12 0927 10/19/12 1012  AST 26 21  ALT 16 18  ALKPHOS 93 82  BILITOT 0.4 1.0  PROT 7.1 6.9  ALBUMIN 4.0 3.7   No results found for this basename: LIPASE, AMYLASE,  in the last 8760 hours No results found for this basename: AMMONIA,  in the last 8760 hours CBC:  Recent Labs  06/11/12 0927 10/19/12 1012  WBC 7.3 10.0  NEUTROABS 3.9 7.0  HGB 13.2 14.2  HCT 40.7 42.7  MCV 92.8 94.9  PLT 173.0 202.0   Lipid Panel:  Recent Labs  10/24/12 1607  CHOL 206*  HDL 50.90  TRIG 127.0  CHOLHDL 4  LDLDIRECT 130.7   Anemia Panel:  Recent Labs  10/24/12 1607  VITAMINB12 262    Past Procedures: NA  Assessment/Plan Hypertension Stopped taking Losartan 100mg  daily. Says she is asymptomatic. The patient was instructed to monitor her blood pressure closely.   Depression Tearful, angry outburst, very low tolerance in general, argumentative at today's visit. She stated that she has lived too long and asked me I can prescribe medication to help her to end her life. Also she said it is the problem that her physical health is reasonable good. She admitted that she has lost weight and her appetite has been poor since she moved from [place] to [place] a year ago, especially since [place] into [place] apartment @ [facility] from her Apartment @ [place] 2 months ago. The patient admitted that she is unreasonable. She declined increasing her Prozac for her depression management. Will obtain Hgb A1c, TSH, CBC, and CMP. EKG  Allergic rhinitis Managed with Loratadine prn.   Hypercholesteremia Not taking cholesterol lowering agent--will check Lipid panel.   Sciatic pain The right sided pain travel down to posterior thigh and knee--prn Ibuprofen helps. Not disabling-ambulates with walker.   Hemorrhoids, external No inflammation or injury.      Family/ Staff Communication: observe the patient.   Goals of Care: [place]  Labs/tests ordered: CBC, CMP, Lipid, Hgb A1c, EKG, TSH

## 2013-05-05 NOTE — Assessment & Plan Note (Signed)
Managed with Loratadine prn.   

## 2013-05-05 NOTE — Assessment & Plan Note (Addendum)
Stopped taking Losartan 100mg  daily. Says she is asymptomatic. The patient was instructed to monitor her blood pressure closely.

## 2013-05-05 NOTE — Assessment & Plan Note (Signed)
Not taking cholesterol lowering agent--will check Lipid panel.

## 2013-05-05 NOTE — Assessment & Plan Note (Addendum)
Tearful, angry outburst, very low tolerance in general, argumentative at today's visit. She stated that she has lived too long and asked me I can prescribe medication to help her to end her life. Also she said it is the problem that her physical health is reasonable good. She admitted that she has lost weight and her appetite has been poor since she moved from [place] to [place] a year ago, especially since she has moved into [place] apartment @ [facility] from her Apartment @ [place] 2 months ago. The patient admitted that she is unreasonable. She declined increasing her Prozac for her depression management. Will obtain Hgb A1c, TSH, CBC, and CMP. EKG

## 2013-06-06 ENCOUNTER — Encounter: Payer: Self-pay | Admitting: Internal Medicine

## 2013-06-06 ENCOUNTER — Non-Acute Institutional Stay: Payer: Medicare Other | Admitting: Internal Medicine

## 2013-06-06 VITALS — BP 176/90 | HR 62 | Ht <= 58 in | Wt 125.0 lb

## 2013-06-06 DIAGNOSIS — I10 Essential (primary) hypertension: Secondary | ICD-10-CM

## 2013-06-06 DIAGNOSIS — M5430 Sciatica, unspecified side: Secondary | ICD-10-CM

## 2013-06-06 DIAGNOSIS — F329 Major depressive disorder, single episode, unspecified: Secondary | ICD-10-CM

## 2013-06-06 DIAGNOSIS — M5431 Sciatica, right side: Secondary | ICD-10-CM

## 2013-06-06 DIAGNOSIS — E7800 Pure hypercholesterolemia, unspecified: Secondary | ICD-10-CM

## 2013-06-06 NOTE — Progress Notes (Signed)
Subjective:    Patient ID: Alice Swanson, female    DOB: 1919-11-20, 79 y.o.   MRN: [id number]  HPI Initial visit with me. She is living in the independent section of [facility].  Since moving to [place] from [place] last year, she has had a rough time. She stopped taking her times a year and underwent a serious episode of depression. She seems to be coming through that now. She continues to be on Prozac.  Since her last visit here, she stopped both simvastatin and losartan. She says she has lost some weight and really didn't think she needed these medications. Recent blood pressure by [facility] clinic nurse was 120/70 as best she can recall. She has no headaches or palpitations or chest pain. Recent cholesterol an elevated total cholesterol at 230 and LDL of 142. She has no previous history of significant heart disease and has never had a TIA or stroke.  Previous problems include sciatica down the right leg. This is not as much of a problem at the present time.   Review of Systems  Constitutional: Negative.   HENT: Positive for hearing loss. Negative for ear pain, congestion, neck pain and neck stiffness.   Eyes: Positive for visual disturbance.       History of glaucoma and macular degeneration. She sees Dr. Dione Booze.  Respiratory: Negative.   Cardiovascular: Negative for chest pain, palpitations and leg swelling.  Gastrointestinal: Negative.   Endocrine: Negative.   Genitourinary: Negative.   Musculoskeletal: Positive for back pain. Negative for myalgias, joint swelling and gait problem.       History of sciatica with involvement of the right leg.  Allergic/Immunologic: Negative.   Neurological:       She was previously evaluated in [place] for episodes of dizziness. She does not recall diagnosis. She is unsure what tests were done, but thinks they included cardiac tests and brain scan. She continues to have dizzy episodes, but they're not as severe as in the past.  She has never had syncope or seizure.  Hematological: Negative.   Psychiatric/Behavioral:       Severe depression which is improving.       Objective:   Physical Exam  Constitutional: She is oriented to person, place, and time. She appears well-developed and well-nourished. No distress.  Elderly, frail.  HENT:  Head: Normocephalic and atraumatic.  Right Ear: External ear normal.  Left Ear: External ear normal.  Nose: Nose normal.  Mouth/Throat: Oropharynx is clear and moist.  Eyes: Conjunctivae are normal. Pupils are equal, round, and reactive to light.  Bilateral intraocular lens.  Neck: No JVD present. No tracheal deviation present. No thyromegaly present.  Cardiovascular: Normal rate, regular rhythm, normal heart sounds and intact distal pulses.  Exam reveals no friction rub.   No murmur heard. Pulmonary/Chest: No respiratory distress. She has no wheezes. She has no rales. She exhibits no tenderness.  Abdominal: She exhibits no distension and no mass. There is no tenderness.  Musculoskeletal: Normal range of motion. She exhibits no edema and no tenderness.  Lymphadenopathy:    She has no cervical adenopathy.  Neurological: She is alert and oriented to person, place, and time. She has normal reflexes. No cranial nerve deficit.  Bilaterally intact sensation. Unstable with Romberg testing.  Skin: No rash noted. No erythema. No pallor.  Psychiatric: She has a normal mood and affect. Her behavior is normal. Judgment and thought content normal.     LAB REVIEW 05/28/13 CBC normal  CMP  normal  Lipids: CC 230, trig 176, HDL 53, LDL 142  TSH 1.936  A1c 5.8    Assessment & Plan:  Hypertension: Current blood pressure shows an elevated systolic pressure. Reports a recent blood pressures obtained the clinic nurse, Corey Harold, suggested her blood pressure is normal. I did not resume Losartan.  Hypercholesteremia: Patient has a moderately elevated total cholesterol and LDL. This is offset  by a good HDL. There is no significant personal or family history of cardiovascular disease. I recommended that we simply repeat this test in a few months. She will continue to follow a low saturated fat diet. I did not resume simvastatin.  Depression: improving. Continue Prozac  Sciatic pain, right: improved

## 2013-06-25 ENCOUNTER — Encounter: Payer: Self-pay | Admitting: Internal Medicine

## 2013-06-26 ENCOUNTER — Other Ambulatory Visit: Payer: Self-pay | Admitting: Family

## 2013-08-29 ENCOUNTER — Encounter: Payer: Self-pay | Admitting: Internal Medicine

## 2013-08-29 ENCOUNTER — Non-Acute Institutional Stay: Payer: Medicare Other | Admitting: Internal Medicine

## 2013-08-29 VITALS — BP 158/84 | HR 62 | Ht <= 58 in | Wt 129.0 lb

## 2013-08-29 DIAGNOSIS — M5430 Sciatica, unspecified side: Secondary | ICD-10-CM

## 2013-08-29 DIAGNOSIS — F32A Depression, unspecified: Secondary | ICD-10-CM

## 2013-08-29 DIAGNOSIS — F329 Major depressive disorder, single episode, unspecified: Secondary | ICD-10-CM

## 2013-08-29 DIAGNOSIS — M5431 Sciatica, right side: Secondary | ICD-10-CM

## 2013-08-29 DIAGNOSIS — E7800 Pure hypercholesterolemia, unspecified: Secondary | ICD-10-CM

## 2013-08-29 DIAGNOSIS — R269 Unspecified abnormalities of gait and mobility: Secondary | ICD-10-CM | POA: Insufficient documentation

## 2013-08-29 DIAGNOSIS — I10 Essential (primary) hypertension: Secondary | ICD-10-CM

## 2013-08-29 NOTE — Progress Notes (Signed)
Subjective:    Patient ID: Alice Swanson, female    DOB: 01-24-1920, 79 y.o.   MRN: [id number]  Chief Complaint  Patient presents with  . Medical Managment of Chronic Issues    blood pressure, depression, memory     HPI Hypertension:resumed losartan 50 mg on 08/01/13 due to persistent BP elevations.  Sciatic pain, right: improved  Hypercholesteremia: LDL 147  Depression: improved  Abnormality of gait: unstable. Fall risk. Using 4 wheel walker    Current Outpatient Prescriptions on File Prior to Visit  Medication Sig Dispense Refill  . dorzolamide-timolol (COSOPT) 22.3-6.8 MG/ML ophthalmic solution Use one drop in both eyes twice daily      . FLUoxetine (PROZAC) 20 MG tablet TAKE 1 TABLET BY MOUTH DAILY.  30 tablet  3  . ibuprofen (ADVIL,MOTRIN) 200 MG tablet Take 200 mg by mouth. Take 1-2 tablets as needed      . Multiple Vitamins-Minerals (ICAPS) CAPS Take by mouth. Take 2 daily      . polyethylene glycol (MIRALAX / GLYCOLAX) packet Take 17 g by mouth as needed.       No current facility-administered medications on file prior to visit.    Review of Systems  Constitutional: Negative.   HENT: Positive for hearing loss. Negative for congestion and ear pain.   Eyes: Positive for visual disturbance.       History of glaucoma and macular degeneration. She sees Dr. Dione Booze.  Respiratory: Negative.   Cardiovascular: Negative for chest pain, palpitations and leg swelling.  Gastrointestinal: Negative.   Endocrine: Negative.   Genitourinary: Negative.   Musculoskeletal: Positive for back pain. Negative for gait problem, joint swelling, myalgias, neck pain and neck stiffness.       History of sciatica with involvement of the right leg.  Allergic/Immunologic: Negative.   Neurological:       She was previously evaluated in [place] for episodes of dizziness. She does not recall diagnosis. She is unsure what tests were done, but thinks they included cardiac tests and brain scan. She  continues to have dizzy episodes, but they're not as severe as in the past. She has never had syncope or seizure.  Hematological: Negative.   Psychiatric/Behavioral:       Severe depression which is improving.       Objective:BP 158/84  Pulse 62  Ht 4' 9.5" (1.461 m)  Wt 129 lb (58.514 kg)  BMI 27.41 kg/m2    Physical Exam  Constitutional: She is oriented to person, place, and time. She appears well-developed and well-nourished. No distress.  Elderly, frail.  HENT:  Head: Normocephalic and atraumatic.  Right Ear: External ear normal.  Left Ear: External ear normal.  Nose: Nose normal.  Mouth/Throat: Oropharynx is clear and moist.  Eyes: Conjunctivae are normal. Pupils are equal, round, and reactive to light.  Bilateral intraocular lens.  Neck: No JVD present. No tracheal deviation present. No thyromegaly present.  Cardiovascular: Normal rate, regular rhythm, normal heart sounds and intact distal pulses.  Exam reveals no friction rub.   No murmur heard. Pulmonary/Chest: No respiratory distress. She has no wheezes. She has no rales. She exhibits no tenderness.  Abdominal: She exhibits no distension and no mass. There is no tenderness.  Musculoskeletal: Normal range of motion. She exhibits no edema and no tenderness.  Lymphadenopathy:    She has no cervical adenopathy.  Neurological: She is alert and oriented to person, place, and time. She has normal reflexes. No cranial nerve deficit.  Bilaterally intact sensation. Unstable  with Romberg testing. 08/29/13 MMSE total 29/30. Passed clock drawing.  Skin: No rash noted. No erythema. No pallor.  Psychiatric: She has a normal mood and affect. Her behavior is normal. Judgment and thought content normal.    LABS REVIEW 08/22/2013 lipids:TC 226, trig 93, HDL 60, LDL 147      Assessment & Plan:  1. Hypertension Moderate elevation in SBP. No change in medications. Losartan was resumed 08/01/2013.  2. Sciatic pain,  right Improved  3. Hypercholesteremia Moderate elevation in LDL. No change in medication  4. Depression Stable  5. Abnormality of gait Unchanged

## 2013-08-29 NOTE — Progress Notes (Signed)
Passed clock drawing 

## 2013-09-03 NOTE — Patient Instructions (Signed)
Continue current medications. 

## 2013-09-05 ENCOUNTER — Other Ambulatory Visit: Payer: Self-pay | Admitting: Internal Medicine

## 2013-09-19 ENCOUNTER — Encounter: Payer: Self-pay | Admitting: Internal Medicine

## 2013-10-02 ENCOUNTER — Ambulatory Visit (INDEPENDENT_AMBULATORY_CARE_PROVIDER_SITE_OTHER): Payer: Medicare Other | Admitting: Ophthalmology

## 2013-10-02 DIAGNOSIS — H35039 Hypertensive retinopathy, unspecified eye: Secondary | ICD-10-CM

## 2013-10-02 DIAGNOSIS — H43819 Vitreous degeneration, unspecified eye: Secondary | ICD-10-CM

## 2013-10-02 DIAGNOSIS — I10 Essential (primary) hypertension: Secondary | ICD-10-CM

## 2013-10-02 DIAGNOSIS — H3530 Unspecified macular degeneration: Secondary | ICD-10-CM

## 2013-10-16 ENCOUNTER — Other Ambulatory Visit: Payer: Self-pay | Admitting: Internal Medicine

## 2013-10-28 ENCOUNTER — Other Ambulatory Visit: Payer: Self-pay | Admitting: *Deleted

## 2013-10-28 MED ORDER — FLUOXETINE HCL 20 MG PO TABS: ORAL_TABLET | ORAL | Status: DC

## 2013-12-02 ENCOUNTER — Other Ambulatory Visit: Payer: Self-pay | Admitting: Internal Medicine

## 2013-12-26 LAB — BASIC METABOLIC PANEL
BUN: 18 mg/dL (ref 4–21)
Creatinine: 0.8 mg/dL (ref 0.5–1.1)
GLUCOSE: 93 mg/dL
Potassium: 3.6 mmol/L (ref 3.4–5.3)
Sodium: 140 mmol/L (ref 137–147)

## 2013-12-26 LAB — LIPID PANEL
Cholesterol: 213 mg/dL — AB (ref 0–200)
HDL: 56 mg/dL (ref 35–70)
LDL Cholesterol: 22 mg/dL
LDl/HDL Ratio: 3.8
Triglycerides: 112 mg/dL (ref 40–160)

## 2013-12-26 LAB — HEPATIC FUNCTION PANEL
ALK PHOS: 84 U/L (ref 25–125)
ALT: 11 U/L (ref 7–35)
AST: 14 U/L (ref 13–35)
Bilirubin, Total: 0.6 mg/dL

## 2014-01-02 ENCOUNTER — Encounter: Payer: Self-pay | Admitting: Internal Medicine

## 2014-01-09 ENCOUNTER — Encounter: Payer: Self-pay | Admitting: Nurse Practitioner

## 2014-01-09 ENCOUNTER — Non-Acute Institutional Stay: Payer: Medicare Other | Admitting: Nurse Practitioner

## 2014-01-09 VITALS — BP 164/78 | HR 64 | Resp 16 | Wt 130.0 lb

## 2014-01-09 DIAGNOSIS — F329 Major depressive disorder, single episode, unspecified: Secondary | ICD-10-CM

## 2014-01-09 DIAGNOSIS — I10 Essential (primary) hypertension: Secondary | ICD-10-CM

## 2014-01-09 DIAGNOSIS — F32A Depression, unspecified: Secondary | ICD-10-CM

## 2014-01-09 DIAGNOSIS — R269 Unspecified abnormalities of gait and mobility: Secondary | ICD-10-CM

## 2014-01-09 DIAGNOSIS — R413 Other amnesia: Secondary | ICD-10-CM

## 2014-01-09 DIAGNOSIS — F3289 Other specified depressive episodes: Secondary | ICD-10-CM

## 2014-01-09 DIAGNOSIS — K5900 Constipation, unspecified: Secondary | ICD-10-CM

## 2014-01-12 DIAGNOSIS — K5900 Constipation, unspecified: Secondary | ICD-10-CM | POA: Insufficient documentation

## 2014-01-12 DIAGNOSIS — R413 Other amnesia: Secondary | ICD-10-CM | POA: Insufficient documentation

## 2014-01-12 NOTE — Assessment & Plan Note (Signed)
Ambulates with walker-encourage daily waking to maintain her mobility.

## 2014-01-12 NOTE — Assessment & Plan Note (Addendum)
Tearful, angry outburst, very low tolerance in general, argumentative at visit 04/2013. She stated that she has lived too long and asked me I can prescribe medication to help her to end her life. Also she said it is the problem that her physical health is reasonable good. She admitted that she has lost weight and her appetite has been poor since she moved from [place] to [place] a year ago, especially since she has moved into [place] apartment @ [facility] from her Apartment @ [place] 2 months ago. The patient admitted that she is unreasonable. She declined increasing her Prozac for her depression management. Unremarkable CBC, and CMP 12/2013. During today's visit: Tearful and paranoia: said her 78 years old sister died recently. Her sister's son who is MD didn't do good job in taking care of her sister and may have given her something to assisted her dying.May be he can give me some pills to help me die.  She wants to decreased her Fluoxetine-I told her she may want to try different agent or adding Abilify to better managing her mood. Also told me that she has difficulty focusing, sleeping, and resting. She said sometimes she has strange dreams with visual hallucination. Hx of fail discontinuation of Fluoxetine.

## 2014-01-12 NOTE — Assessment & Plan Note (Signed)
Daily prn MiraLax is adequate along with diet control.   

## 2014-01-12 NOTE — Assessment & Plan Note (Addendum)
Admitted memory lapses: will obtain MMSE. Check TSH and B12.   

## 2014-01-12 NOTE — Assessment & Plan Note (Addendum)
takes Losartan 50mg  daily. Mild elevated SBP-will continue to observe her BP

## 2014-01-12 NOTE — Progress Notes (Signed)
Patient ID: Alice Swanson, female   DOB: [birth year], 80 y.o.   MRN: [id number]    Location of Service Clinic [facility]  Allergies  Allergen Reactions  . Penicillins     Chief Complaint  Patient presents with  . Medical Managment of Chronic Issues    blood pressure, cholesterol, depression    HPI: Patient is a 80 y.o. female seen in the clinic at [facility] today for evaluation of her chronic medical conditions Problem List Items Addressed This Visit   Abnormality of gait     Ambulates with walker-encourage daily waking to maintain her mobility.     Depression     Tearful, angry outburst, very low tolerance in general, argumentative at visit 04/2013. She stated that she has lived too long and asked me I can prescribe medication to help her to end her life. Also she said it is the problem that her physical health is reasonable good. She admitted that she has lost weight and her appetite has been poor since she moved from [place] to [place] a year ago, especially since she has moved into [place] apartment @ [facility] from her Apartment @ [place] 2 months ago. The patient admitted that she is unreasonable. She declined increasing her Prozac for her depression management. Unremarkable CBC, and CMP 12/2013. During today's visit: Tearful and paranoia: said her 78 years old sister died recently. Her sister's son who is MD didn't do good job in taking care of her sister and may have given her something to assisted her dying.May be he can give me some pills to help me die.  She wants to decreased her Fluoxetine-I told her she may want to try different agent or adding Abilify to better managing her mood. Also told me that she has difficulty focusing, sleeping, and resting. She said sometimes she has strange dreams with visual hallucination. Hx of fail discontinuation of Fluoxetine.      Hypertension     takes Losartan 50mg  daily. Mild elevated SBP-will continue to observe her BP    Memory deficits - Primary   Admitted memory lapses: will obtain MMSE. Check TSH and B12.     Unspecified constipation     Daily prn MiraLax is adequate along with diet control.        Review of Systems:  Review of Systems  Constitutional: Positive for weight loss and diaphoresis (expressive perspiring easily). Negative for fever, chills and malaise/fatigue.  HENT: Positive for hearing loss. Negative for congestion, ear discharge, ear pain, nosebleeds, sore throat and tinnitus.   Eyes: Negative for blurred vision, double vision, photophobia, pain, discharge and redness.  Respiratory: Negative for cough, hemoptysis, sputum production, shortness of breath, wheezing and stridor.   Cardiovascular: Negative for chest pain, palpitations, orthopnea, claudication, leg swelling and PND.  Gastrointestinal: Negative for heartburn, nausea, vomiting, abdominal pain, diarrhea, constipation, blood in stool and melena.  Genitourinary: Negative for dysuria, urgency, frequency, hematuria and flank pain.  Musculoskeletal: Positive for back pain (right sided sciatic pain). Negative for falls, joint pain, myalgias and neck pain.  Skin: Negative for itching and rash.  Neurological: Negative for dizziness, tingling, tremors, sensory change, speech change, focal weakness, seizures, loss of consciousness, weakness and headaches.  Endo/Heme/Allergies: Negative for environmental allergies and polydipsia. Does not bruise/bleed easily.  Psychiatric/Behavioral: Positive for depression. Negative for suicidal ideas, hallucinations, memory loss and substance abuse. The patient is nervous/anxious. The patient does not have insomnia.      Past Medical History  Diagnosis Date  .  Arthritis   . Depression   . Glaucoma     both eyes  . Hyperlipidemia   . Hypertension   . Cardiac arrhythmia due to congenital heart disease   . Sciatica   . Anxiety   . Allergic rhinitis   . Macular degeneration of both eyes    Past Surgical History  Procedure  Laterality Date  . Tonsillectomy and adenoidectomy  1926  . Appendectomy  1932  . Abdominal hysterectomy  1962  . Cataract extraction w/ intraocular lens implant Right 12/2011    Dr. Pricilla Holmucker  . Cataract extraction w/ intraocular lens implant Left 02/2013    Dr. Dione BoozeGroat  . Colonoscopy  2005   Social History:   reports that she has never smoked. She has never used smokeless tobacco. She reports that she does not drink alcohol or use illicit drugs.  Family History  Problem Relation Age of Onset  . Heart disease Mother     MI  . Stroke Father     Medications: Patient's Medications  New Prescriptions   No medications on file  Previous Medications   DORZOLAMIDE-TIMOLOL (COSOPT) 22.3-6.8 MG/ML OPHTHALMIC SOLUTION    Use one drop in both eyes twice daily   FLUOXETINE (PROZAC) 20 MG TABLET    Take one tablet by mouth once daily   IBUPROFEN (ADVIL,MOTRIN) 200 MG TABLET    Take 200 mg by mouth. Take 1-2 tablets as needed   LOSARTAN (COZAAR) 50 MG TABLET    TAKE 1 TABLET EVERY DAY TO CONTROL BLOOD PRESSURE   MULTIPLE VITAMINS-MINERALS (ICAPS) CAPS    Take by mouth. Take 2 daily   POLYETHYLENE GLYCOL (MIRALAX / GLYCOLAX) PACKET    Take 17 g by mouth as needed.  Modified Medications   No medications on file  Discontinued Medications   No medications on file     Physical Exam: Physical Exam  Constitutional: She is oriented to person, place, and time. She appears well-developed and well-nourished. No distress.  HENT:  Head: Normocephalic and atraumatic.  Right Ear: External ear normal.  Left Ear: External ear normal.  Nose: Nose normal.  Mouth/Throat: Oropharynx is clear and moist. No oropharyngeal exudate.  Eyes: Conjunctivae and EOM are normal. Pupils are equal, round, and reactive to light. Right eye exhibits no discharge. Left eye exhibits no discharge. No scleral icterus.  Neck: Normal range of motion. Neck supple. No JVD present. No tracheal deviation present. No thyromegaly present.   Cardiovascular: Normal rate, regular rhythm, normal heart sounds and intact distal pulses.   No murmur heard. Pulmonary/Chest: Effort normal and breath sounds normal. No stridor. No respiratory distress. She has no wheezes. She has no rales. She exhibits no tenderness.  Abdominal: Soft. Bowel sounds are normal. She exhibits no distension. There is no tenderness. There is no rebound and no guarding.  Genitourinary: Vagina normal. Guaiac negative stool. No vaginal discharge found.  External hemorrhoids-stable.   Musculoskeletal: Normal range of motion. She exhibits no edema and no tenderness.  Lymphadenopathy:    She has no cervical adenopathy.  Neurological: She is alert and oriented to person, place, and time. She displays normal reflexes. No cranial nerve deficit. She exhibits normal muscle tone. Coordination normal.  Skin: Skin is warm and dry. No rash noted. She is not diaphoretic. No erythema. No pallor.  Psychiatric: Her mood appears anxious. Her affect is angry, blunt, labile and inappropriate. Her speech is not rapid and/or pressured, not delayed, not tangential and not slurred. She is agitated, aggressive and  hyperactive. She is not slowed, not withdrawn, not actively hallucinating and not combative. Thought content is not paranoid and not delusional. Cognition and memory are not impaired. She expresses impulsivity and inappropriate judgment. She exhibits a depressed mood. She expresses no homicidal and no suicidal ideation. She expresses no suicidal plans and no homicidal plans. She is communicative. She exhibits normal recent memory and normal remote memory. She is attentive.    Filed Vitals:   01/09/14 1330  BP: 164/78  Pulse: 64  Resp: 16  Weight: 130 lb (58.968 kg)      Labs reviewed: Basic Metabolic Panel:  Recent Labs  40/98/11  NA 140  K 3.6  BUN 18  CREATININE 0.8   Liver Function Tests:  Recent Labs  12/26/13  AST 14  ALT 11  ALKPHOS 84   No results found  for this basename: LIPASE, AMYLASE,  in the last 8760 hours No results found for this basename: AMMONIA,  in the last 8760 hours CBC: No results found for this basename: WBC, NEUTROABS, HGB, HCT, MCV, PLT,  in the last 8760 hours Lipid Panel:  Recent Labs  12/26/13  CHOL 213*  HDL 56  LDLCALC 22  TRIG 112   Anemia Panel: No results found for this basename: FOLATE, IRON, VITAMINB12,  in the last 8760 hours  Past Procedures: NA  Assessment/Plan Abnormality of gait Ambulates with walker-encourage daily waking to maintain her mobility.   Depression Tearful, angry outburst, very low tolerance in general, argumentative at visit 04/2013. She stated that she has lived too long and asked me I can prescribe medication to help her to end her life. Also she said it is the problem that her physical health is reasonable good. She admitted that she has lost weight and her appetite has been poor since she moved from [place] to [place] a year ago, especially since [place] has [place] into [place] apartment @ [facility] from her Apartment @ [place] 2 months ago. The patient admitted that she is unreasonable. She declined increasing her Prozac for her depression management. Unremarkable CBC, and CMP 12/2013. During today's visit: Tearful and paranoia: said her 61 years old sister died recently. Her sister's son who is MD didn't do good job in taking care of her sister and may have given her something to assisted her dying.May be he can give me some pills to help me die.  She wants to decreased her Fluoxetine-I told her she may want to try different agent or adding Abilify to better managing her mood. Also told me that she has difficulty focusing, sleeping, and resting. She said sometimes she has strange dreams with visual hallucination. Hx of fail discontinuation of Fluoxetine.    Memory deficits Admitted memory lapses: will obtain MMSE. Check TSH and B12.   Hypertension takes Losartan 50mg  daily. Mild elevated SBP-will continue to  observe her BP  Unspecified constipation Daily prn MiraLax is adequate along with diet control.     Family/ Staff Communication: observe the patient. MMSE  Goals of Care: [place]  Labs/tests ordered: Vit B12 andTSH

## 2014-01-28 ENCOUNTER — Encounter: Payer: Self-pay | Admitting: Internal Medicine

## 2014-02-11 ENCOUNTER — Encounter: Payer: Self-pay | Admitting: Nurse Practitioner

## 2014-04-01 ENCOUNTER — Other Ambulatory Visit: Payer: Self-pay | Admitting: Internal Medicine

## 2014-04-21 ENCOUNTER — Other Ambulatory Visit: Payer: Self-pay | Admitting: Internal Medicine

## 2014-07-10 ENCOUNTER — Encounter: Payer: Self-pay | Admitting: Nurse Practitioner

## 2014-07-31 ENCOUNTER — Other Ambulatory Visit: Payer: Self-pay | Admitting: Nurse Practitioner

## 2014-08-03 ENCOUNTER — Other Ambulatory Visit: Payer: Self-pay | Admitting: Nurse Practitioner

## 2014-08-07 ENCOUNTER — Encounter: Payer: Self-pay | Admitting: Nurse Practitioner

## 2014-08-31 ENCOUNTER — Observation Stay (HOSPITAL_COMMUNITY): Payer: Medicare Other

## 2014-08-31 ENCOUNTER — Encounter (HOSPITAL_COMMUNITY): Payer: Self-pay | Admitting: *Deleted

## 2014-08-31 ENCOUNTER — Emergency Department (HOSPITAL_COMMUNITY): Payer: Medicare Other

## 2014-08-31 ENCOUNTER — Observation Stay (HOSPITAL_COMMUNITY)
Admission: EM | Admit: 2014-08-31 | Discharge: 2014-09-02 | Disposition: A | Discharge status: Skilled Nursing Facility | Payer: Medicare Other | Attending: Internal Medicine | Admitting: Internal Medicine

## 2014-08-31 DIAGNOSIS — R05 Cough: Secondary | ICD-10-CM

## 2014-08-31 DIAGNOSIS — F419 Anxiety disorder, unspecified: Secondary | ICD-10-CM | POA: Insufficient documentation

## 2014-08-31 DIAGNOSIS — E785 Hyperlipidemia, unspecified: Secondary | ICD-10-CM | POA: Diagnosis not present

## 2014-08-31 DIAGNOSIS — M5430 Sciatica, unspecified side: Secondary | ICD-10-CM | POA: Insufficient documentation

## 2014-08-31 DIAGNOSIS — G459 Transient cerebral ischemic attack, unspecified: Principal | ICD-10-CM | POA: Insufficient documentation

## 2014-08-31 DIAGNOSIS — I10 Essential (primary) hypertension: Secondary | ICD-10-CM | POA: Diagnosis not present

## 2014-08-31 DIAGNOSIS — Z880 Allergy status to penicillin: Secondary | ICD-10-CM | POA: Diagnosis not present

## 2014-08-31 DIAGNOSIS — F329 Major depressive disorder, single episode, unspecified: Secondary | ICD-10-CM | POA: Diagnosis not present

## 2014-08-31 DIAGNOSIS — G319 Degenerative disease of nervous system, unspecified: Secondary | ICD-10-CM

## 2014-08-31 DIAGNOSIS — H409 Unspecified glaucoma: Secondary | ICD-10-CM | POA: Insufficient documentation

## 2014-08-31 DIAGNOSIS — J309 Allergic rhinitis, unspecified: Secondary | ICD-10-CM | POA: Diagnosis not present

## 2014-08-31 DIAGNOSIS — R001 Bradycardia, unspecified: Secondary | ICD-10-CM

## 2014-08-31 DIAGNOSIS — R413 Other amnesia: Secondary | ICD-10-CM

## 2014-08-31 DIAGNOSIS — G458 Other transient cerebral ischemic attacks and related syndromes: Secondary | ICD-10-CM | POA: Diagnosis present

## 2014-08-31 DIAGNOSIS — Z79899 Other long term (current) drug therapy: Secondary | ICD-10-CM | POA: Diagnosis not present

## 2014-08-31 DIAGNOSIS — E876 Hypokalemia: Secondary | ICD-10-CM | POA: Diagnosis present

## 2014-08-31 DIAGNOSIS — I639 Cerebral infarction, unspecified: Secondary | ICD-10-CM | POA: Insufficient documentation

## 2014-08-31 DIAGNOSIS — I440 Atrioventricular block, first degree: Secondary | ICD-10-CM | POA: Insufficient documentation

## 2014-08-31 DIAGNOSIS — M1990 Unspecified osteoarthritis, unspecified site: Secondary | ICD-10-CM | POA: Insufficient documentation

## 2014-08-31 DIAGNOSIS — H3530 Unspecified macular degeneration: Secondary | ICD-10-CM | POA: Diagnosis not present

## 2014-08-31 DIAGNOSIS — I679 Cerebrovascular disease, unspecified: Secondary | ICD-10-CM

## 2014-08-31 DIAGNOSIS — R059 Cough, unspecified: Secondary | ICD-10-CM

## 2014-08-31 HISTORY — DX: Degenerative disease of nervous system, unspecified: G31.9

## 2014-08-31 HISTORY — DX: Unspecified osteoarthritis, unspecified site: M19.90

## 2014-08-31 HISTORY — DX: Atrioventricular block, first degree: I44.0

## 2014-08-31 HISTORY — DX: Bradycardia, unspecified: R00.1

## 2014-08-31 HISTORY — DX: Cerebrovascular disease, unspecified: I67.9

## 2014-08-31 HISTORY — DX: Other amnesia: R41.3

## 2014-08-31 HISTORY — DX: Transient cerebral ischemic attack, unspecified: G45.9

## 2014-08-31 LAB — CBC
HEMATOCRIT: 41.1 % (ref 36.0–46.0)
HEMOGLOBIN: 13.4 g/dL (ref 12.0–15.0)
MCH: 30.2 pg (ref 26.0–34.0)
MCHC: 32.6 g/dL (ref 30.0–36.0)
MCV: 92.6 fL (ref 78.0–100.0)
Platelets: 217 10*3/uL (ref 150–400)
RBC: 4.44 MIL/uL (ref 3.87–5.11)
RDW: 13.3 % (ref 11.5–15.5)
WBC: 7.8 10*3/uL (ref 4.0–10.5)

## 2014-08-31 LAB — COMPREHENSIVE METABOLIC PANEL
ALT: 11 U/L (ref 0–35)
AST: 17 U/L (ref 0–37)
Albumin: 3.2 g/dL — ABNORMAL LOW (ref 3.5–5.2)
Alkaline Phosphatase: 93 U/L (ref 39–117)
Anion gap: 13 (ref 5–15)
BILIRUBIN TOTAL: 0.3 mg/dL (ref 0.3–1.2)
BUN: 17 mg/dL (ref 6–23)
CHLORIDE: 105 meq/L (ref 96–112)
CO2: 22 meq/L (ref 19–32)
CREATININE: 0.76 mg/dL (ref 0.50–1.10)
Calcium: 9.2 mg/dL (ref 8.4–10.5)
GFR, EST AFRICAN AMERICAN: 82 mL/min — AB (ref >90)
GFR, EST NON AFRICAN AMERICAN: 71 mL/min — AB (ref >90)
GLUCOSE: 100 mg/dL — AB (ref 70–99)
Potassium: 3.1 mEq/L — ABNORMAL LOW (ref 3.7–5.3)
Sodium: 140 mEq/L (ref 137–147)
Total Protein: 7.3 g/dL (ref 6.0–8.3)

## 2014-08-31 LAB — RAPID URINE DRUG SCREEN, HOSP PERFORMED
Amphetamines: NOT DETECTED
BARBITURATES: NOT DETECTED
Benzodiazepines: NOT DETECTED
COCAINE: NOT DETECTED
OPIATES: NOT DETECTED
Tetrahydrocannabinol: NOT DETECTED

## 2014-08-31 LAB — I-STAT CHEM 8, ED
BUN: 16 mg/dL (ref 6–23)
CALCIUM ION: 1.11 mmol/L — AB (ref 1.13–1.30)
CREATININE: 0.7 mg/dL (ref 0.50–1.10)
Chloride: 107 mEq/L (ref 96–112)
GLUCOSE: 99 mg/dL (ref 70–99)
HCT: 41 % (ref 36.0–46.0)
HEMOGLOBIN: 13.9 g/dL (ref 12.0–15.0)
Potassium: 3 mEq/L — ABNORMAL LOW (ref 3.7–5.3)
Sodium: 143 mEq/L (ref 137–147)
TCO2: 21 mmol/L (ref 0–100)

## 2014-08-31 LAB — URINALYSIS, ROUTINE W REFLEX MICROSCOPIC
Bilirubin Urine: NEGATIVE
Glucose, UA: NEGATIVE mg/dL
Ketones, ur: NEGATIVE mg/dL
Leukocytes, UA: NEGATIVE
Nitrite: NEGATIVE
PH: 7 (ref 5.0–8.0)
Protein, ur: NEGATIVE mg/dL
SPECIFIC GRAVITY, URINE: 1.006 (ref 1.005–1.030)
Urobilinogen, UA: 0.2 mg/dL (ref 0.0–1.0)

## 2014-08-31 LAB — DIFFERENTIAL
BASOS ABS: 0 10*3/uL (ref 0.0–0.1)
Basophils Relative: 0 % (ref 0–1)
Eosinophils Absolute: 0.2 10*3/uL (ref 0.0–0.7)
Eosinophils Relative: 3 % (ref 0–5)
LYMPHS PCT: 34 % (ref 12–46)
Lymphs Abs: 2.6 10*3/uL (ref 0.7–4.0)
Monocytes Absolute: 0.6 10*3/uL (ref 0.1–1.0)
Monocytes Relative: 7 % (ref 3–12)
NEUTROS ABS: 4.4 10*3/uL (ref 1.7–7.7)
Neutrophils Relative %: 56 % (ref 43–77)

## 2014-08-31 LAB — APTT: APTT: 29 s (ref 24–37)

## 2014-08-31 LAB — I-STAT TROPONIN, ED: Troponin i, poc: 0.01 ng/mL (ref 0.00–0.08)

## 2014-08-31 LAB — URINE MICROSCOPIC-ADD ON

## 2014-08-31 LAB — PROTIME-INR
INR: 1.04 (ref 0.00–1.49)
Prothrombin Time: 13.7 seconds (ref 11.6–15.2)

## 2014-08-31 LAB — ETHANOL: Alcohol, Ethyl (B): <11 mg/dL (ref 0–11)

## 2014-08-31 MED ORDER — STROKE: EARLY STAGES OF RECOVERY BOOK
Freq: Once | Status: DC
  Filled 2014-08-31: qty 1

## 2014-08-31 MED ORDER — SODIUM CHLORIDE 0.9 % IJ SOLN
3.0000 mL | Freq: Two times a day (BID) | INTRAMUSCULAR | Status: DC
  Administered 2014-08-31 – 2014-09-02 (×5): 3 mL via INTRAVENOUS

## 2014-08-31 MED ORDER — ASPIRIN 81 MG PO CHEW
324.0000 mg | CHEWABLE_TABLET | Freq: Once | ORAL | Status: AC
  Administered 2014-08-31: 324 mg via ORAL
  Filled 2014-08-31: qty 4

## 2014-08-31 MED ORDER — ONDANSETRON HCL 4 MG/2ML IJ SOLN: 4.0000 mg | Freq: Three times a day (TID) | INTRAMUSCULAR | Status: AC | PRN

## 2014-08-31 MED ORDER — POTASSIUM CHLORIDE 10 MEQ/100ML IV SOLN
10.0000 meq | Freq: Once | INTRAVENOUS | Status: AC
  Administered 2014-08-31: 10 meq via INTRAVENOUS
  Filled 2014-08-31: qty 100

## 2014-08-31 MED ORDER — ENOXAPARIN SODIUM 40 MG/0.4ML ~~LOC~~ SOLN
40.0000 mg | SUBCUTANEOUS | Status: DC
  Administered 2014-08-31 – 2014-09-01 (×2): 40 mg via SUBCUTANEOUS
  Filled 2014-08-31 (×2): qty 0.4

## 2014-08-31 MED ORDER — DORZOLAMIDE HCL-TIMOLOL MAL 2-0.5 % OP SOLN
1.0000 [drp] | Freq: Two times a day (BID) | OPHTHALMIC | Status: DC
  Administered 2014-08-31 – 2014-09-02 (×4): 1 [drp] via OPHTHALMIC
  Filled 2014-08-31: qty 10

## 2014-08-31 MED ORDER — FLUOXETINE HCL 20 MG PO TABS
20.0000 mg | ORAL_TABLET | Freq: Every day | ORAL | Status: DC
  Administered 2014-09-01 – 2014-09-02 (×2): 20 mg via ORAL
  Filled 2014-08-31 (×2): qty 1

## 2014-08-31 MED ORDER — LOSARTAN POTASSIUM 50 MG PO TABS
50.0000 mg | ORAL_TABLET | Freq: Every day | ORAL | Status: DC
  Administered 2014-09-01 – 2014-09-02 (×2): 50 mg via ORAL
  Filled 2014-08-31 (×2): qty 1

## 2014-08-31 MED ORDER — POLYETHYLENE GLYCOL 3350 17 G PO PACK
17.0000 g | PACK | ORAL | Status: DC | PRN
  Filled 2014-08-31: qty 1

## 2014-08-31 MED ORDER — HYDRALAZINE HCL 20 MG/ML IJ SOLN
5.0000 mg | Freq: Three times a day (TID) | INTRAMUSCULAR | Status: DC | PRN
Start: 2014-08-31 — End: 2014-09-02
  Administered 2014-08-31 – 2014-09-02 (×4): 5 mg via INTRAVENOUS
  Filled 2014-08-31 (×4): qty 1

## 2014-08-31 MED ORDER — HYDRALAZINE HCL 20 MG/ML IJ SOLN: 5.0000 mg | Freq: Four times a day (QID) | INTRAMUSCULAR | Status: DC | PRN

## 2014-08-31 MED ORDER — LORATADINE 10 MG PO TABS
10.0000 mg | ORAL_TABLET | Freq: Every day | ORAL | Status: DC | PRN
  Filled 2014-08-31: qty 1

## 2014-08-31 NOTE — Plan of Care (Signed)
Problem: Consults Goal: Ischemic Stroke Patient Education See Patient Education Module for education specifics.  Outcome: Progressing Info given on admission & also discussed with her on this 7p-7am shift  Problem: Acute Treatment Outcomes Goal: Neuro exam at baseline or improved Outcome: Completed/Met Date Met:  08/31/14 Goal: Airway maintained/protected Outcome: Completed/Met Date Met:  08/31/14 Goal: 02 Sats > 94% Outcome: Completed/Met Date Met:  08/31/14 Goal: Hemodynamically stable Outcome: Completed/Met Date Met:  08/31/14 Goal: Prognosis discussed with family/patient as appropriate Outcome: Completed/Met Date Met:  08/31/14

## 2014-08-31 NOTE — ED Provider Notes (Signed)
CSN: [id number]     Arrival date & time 08/31/14  1302 History   First MD Initiated Contact with Patient 08/31/14 1310     Chief Complaint  Patient presents with  . Aphasia  . Difficulty Walking     (Consider location/radiation/quality/duration/timing/severity/associated sxs/prior Treatment) HPI Comments: 80 year old female, history of hypertension, presents with a complaint of slurred speech and right-sided weakness. The patient's daughter states that she talked with her at 8:30 this morning, she had normal speech at that time, throughout the day she has had intermittent slurred speech, it lasts for several minutes and then resolves, she also had difficulty handling with her walker dragging her right leg. The symptoms are intermittent, they improved spontaneously, they are not associated with headaches fevers chills nausea vomiting coughing shortness of breath palpitations swelling of the legs or numbness. She has never had anything like this in the past, she is not on any anticoagulation.  The history is provided by the patient.    Past Medical History  Diagnosis Date  . Arthritis   . Depression   . Glaucoma     both eyes  . Hyperlipidemia   . Hypertension   . Cardiac arrhythmia due to congenital heart disease   . Sciatica   . Anxiety   . Allergic rhinitis   . Macular degeneration of both eyes    Past Surgical History  Procedure Laterality Date  . Tonsillectomy and adenoidectomy  1926  . Appendectomy  1932  . Abdominal hysterectomy  1962  . Cataract extraction w/ intraocular lens implant Right 12/2011    Dr. Pricilla Holmucker  . Cataract extraction w/ intraocular lens implant Left 02/2013    Dr. Dione BoozeGroat  . Colonoscopy  2005   Family History  Problem Relation Age of Onset  . Heart disease Mother     MI  . Stroke Father    History  Substance Use Topics  . Smoking status: Never Smoker   . Smokeless tobacco: Never Used  . Alcohol Use: No   OB History    No data available      Review of Systems  All other systems reviewed and are negative.     Allergies  Penicillins  Home Medications   Prior to Admission medications   Medication Sig Start Date End Date Taking? Authorizing Provider  dorzolamide-timolol (COSOPT) 22.3-6.8 MG/ML ophthalmic solution Use one drop in both eyes twice daily 05/05/13  Yes Historical Provider, MD  FLUoxetine (PROZAC) 20 MG tablet TAKE ONE TABLET BY MOUTH ONCE DAILY 04/21/14  Yes Kimber RelicArthur G Green, MD  ibuprofen (ADVIL,MOTRIN) 200 MG tablet Take 200 mg by mouth. Take 1-2 tablets as needed   Yes Historical Provider, MD  loratadine (CLARITIN) 10 MG tablet Take 10 mg by mouth daily as needed for allergies.   Yes Historical Provider, MD  losartan (COZAAR) 50 MG tablet TAKE 1 TABLET EVERY DAY TO CONTROL BLOOD PRESSURE 07/31/14  Yes Sharon SellerJessica K Eubanks, NP  Multiple Vitamins-Minerals (ICAPS) CAPS Take 1 capsule by mouth 2 (two) times daily.    Yes Historical Provider, MD  polyethylene glycol (MIRALAX / GLYCOLAX) packet Take 17 g by mouth as needed.   Yes Historical Provider, MD   BP 191/75 mmHg  Pulse 57  Temp(Src) 98.2 F (36.8 C) (Oral)  Resp 14  Ht 4\' 11"  (1.499 m)  Wt 133 lb (60.328 kg)  BMI 26.85 kg/m2  SpO2 96% Physical Exam  Constitutional: She appears well-developed and well-nourished. No distress.  HENT:  Head: Normocephalic and atraumatic.  Mouth/Throat: Oropharynx is clear and moist. No oropharyngeal exudate.  Eyes: Conjunctivae and EOM are normal. Pupils are equal, round, and reactive to light. Right eye exhibits no discharge. Left eye exhibits no discharge. No scleral icterus.  Neck: Normal range of motion. Neck supple. No JVD present. No thyromegaly present.  Cardiovascular: Normal rate, regular rhythm, normal heart sounds and intact distal pulses.  Exam reveals no gallop and no friction rub.   No murmur heard. Pulmonary/Chest: Effort normal and breath sounds normal. No respiratory distress. She has no wheezes. She has no  rales.  Abdominal: Soft. Bowel sounds are normal. She exhibits no distension and no mass. There is no tenderness.  Musculoskeletal: Normal range of motion. She exhibits no edema or tenderness.  Lymphadenopathy:    She has no cervical adenopathy.  Neurological: She is alert. Coordination normal.  Speech is clear, cranial nerves III through XII are intact, memory is intact, strength is normal in all 4 extremities including grips, sensation is intact to light touch and pinprick in all 4 extremities. Coordination as tested by finger-nose-finger is normal, no limb ataxia. normal reflexes at the patellar tendon on the left, decreased on the right, normal reflexes at the brachial radialis bilaterally  Skin: Skin is warm and dry. No rash noted. No erythema.  Psychiatric: She has a normal mood and affect. Her behavior is normal.  Nursing note and vitals reviewed.   ED Course  Procedures (including critical care time) Labs Review Labs Reviewed  COMPREHENSIVE METABOLIC PANEL - Abnormal; Notable for the following:    Potassium 3.1 (*)    Glucose, Bld 100 (*)    Albumin 3.2 (*)    GFR calc non Af Amer 71 (*)    GFR calc Af Amer 82 (*)    All other components within normal limits  URINALYSIS, ROUTINE W REFLEX MICROSCOPIC - Abnormal; Notable for the following:    Hgb urine dipstick SMALL (*)    All other components within normal limits  I-STAT CHEM 8, ED - Abnormal; Notable for the following:    Potassium 3.0 (*)    Calcium, Ion 1.11 (*)    All other components within normal limits  ETHANOL  PROTIME-INR  APTT  CBC  DIFFERENTIAL  URINE RAPID DRUG SCREEN (HOSP PERFORMED)  URINE MICROSCOPIC-ADD ON  I-STAT TROPOININ, ED  I-STAT TROPOININ, ED    Imaging Review Ct Head Wo Contrast  08/31/2014   CLINICAL DATA:  Right leg weakness and slurred speech. New onset of dysphagia.  EXAM: CT HEAD WITHOUT CONTRAST  TECHNIQUE: Contiguous axial images were obtained from the base of the skull through the  vertex without intravenous contrast.  COMPARISON:  None.  FINDINGS: Sinuses/Soft tissues: Mucosal thickening of the sphenoid sinuses. Clear mastoid air cells.  Intracranial: Expected cerebral atrophy. Moderate to marked low density in the periventricular white matter likely related to small vessel disease. Physiologic calcifications within the basal ganglia. Remote lacunar infarct in left caudate head. No mass lesion, hemorrhage, hydrocephalus, acute infarct, intra-axial, or extra-axial fluid collection.  Vertebral and carotid atherosclerosis.  IMPRESSION: 1.  No acute intracranial abnormality. 2.  Cerebral atrophy and small vessel ischemic change. 3. Sinus disease.   Electronically Signed   By: Jeronimo GreavesKyle  Talbot M.D.   On: 08/31/2014 14:46     EKG Interpretation   Date/Time:  Sunday August 31 2014 13:05:18 EST Ventricular Rate:  68 PR Interval:  216 QRS Duration: 90 QT Interval:  426 QTC Calculation: 453 R Axis:   11 Text Interpretation:  Sinus rhythm Atrial premature complexes Borderline  prolonged PR interval Anteroseptal infarct, age indeterminate Nonspecific  T abnormalities, lateral leads Abnormal ekg No old tracing to compare  Confirmed by [facility]  MD, Trenice Mesa ([id number]) on 08/31/2014 1:31:24 PM      MDM   Final diagnoses:  Other specified transient cerebral ischemias  Hypokalemia    The patient is likely having transient ischemic attacks, she is mildly hypertensive at 180/96, no fever or tachycardia, normal oxygen, follow with CT scan, labs, likely admission to the hospital for TIA.  CT scan negative for acute findings, no bleed, there is evidence of microscopic ischemic change which I would suspect given her age. Laboratory workup rather unremarkable, blood pressure remains elevated, potassium 3.0, replacement given, will discussed with neurology, admitted to hospitalist for telemetry overnight.  Meds given in ED:  Medications  aspirin chewable tablet 324 mg (not administered)   potassium chloride 10 mEq in 100 mL IVPB (not administered)    New Prescriptions   No medications on file      Vida Roller, MD 08/31/14 1517

## 2014-08-31 NOTE — ED Notes (Signed)
Patient's mother reports that she spoke with her mother at 8.30 and patient had normal speech. Per patient's mother patient had difficulty speaking @ 11.30 today. Pt states no N&V.

## 2014-08-31 NOTE — ED Notes (Signed)
Admitting MD at bedside.

## 2014-08-31 NOTE — H&P (Addendum)
[facility] History and Physical  Zarinah Oviatt ZOX:[id number] DOB: [birth year] DOA: 08/31/2014  Referring physician: EDP PCP: Kimber Relic, MD   Chief Complaint: SLURRED SPEECH around 11 this am.  HPI: Alice Swanson is a 80 y.o. female with h/o hypertension, depression, was brought in by her daughter for a brief episode of slurred speech and generalized weakness, and more weakness in the right leg. As per the daughter, she was heard normal over the phone around 8 30 am and later on around 11 she was found to have slurred speech and the patient reported weakness in the right leg. The daughter felt the patient was dragging her right leg when she was walking. Her symptoms resolved spontaneously within few min. She was brought to ED and CT didn't show any acute intracranial pathology. She was referred to medical service for admission for TIA. HER symptoms have completely resolved on neuro exam in ED. Neurology also consulted by EDP.    Review of Systems:  Constitutional:  No weight loss, night sweats, Fevers, chills, fatigue.  HEENT:  No headaches, Difficulty swallowing,Tooth/dental problems,Sore throat,  No sneezing, itching, ear ache, nasal congestion, post nasal drip,  Cardio-vascular:  No chest pain, Orthopnea, PND, swelling in lower extremities, anasarca, dizziness, palpitations  GI:  No heartburn, indigestion, abdominal pain, nausea, vomiting, diarrhea, change in bowel habits, loss of appetite  Resp:  No shortness of breath with exertion or at rest. No excess mucus, no productive cough, No non-productive cough, No coughing up of blood.No change in color of mucus.No wheezing.No chest wall deformity  Skin:  no rash or lesions.  GU:  no dysuria, change in color of urine, no urgency or frequency. No flank pain.  Musculoskeletal:  No joint pain or swelling. No decreased range of motion. No back pain.  Psych:  No change in mood or affect. No depression or anxiety. No memory loss.    Past Medical History  Diagnosis Date  . Arthritis   . Depression   . Glaucoma     both eyes  . Hyperlipidemia   . Hypertension   . Cardiac arrhythmia due to congenital heart disease   . Sciatica   . Anxiety   . Allergic rhinitis   . Macular degeneration of both eyes    Past Surgical History  Procedure Laterality Date  . Tonsillectomy and adenoidectomy  1926  . Appendectomy  1932  . Abdominal hysterectomy  1962  . Cataract extraction w/ intraocular lens implant Right 12/2011    Dr. Pricilla Holm  . Cataract extraction w/ intraocular lens implant Left 02/2013    Dr. Dione Booze  . Colonoscopy  2005   Social History:  reports that she has never smoked. She has never used smokeless tobacco. She reports that she does not drink alcohol or use illicit drugs.  Allergies  Allergen Reactions  . Penicillins     Family History  Problem Relation Age of Onset  . Heart disease Mother     MI  . Stroke Father      Prior to Admission medications   Medication Sig Start Date End Date Taking? Authorizing Provider  dorzolamide-timolol (COSOPT) 22.3-6.8 MG/ML ophthalmic solution Use one drop in both eyes twice daily 05/05/13  Yes Historical Provider, MD  FLUoxetine (PROZAC) 20 MG tablet TAKE ONE TABLET BY MOUTH ONCE DAILY 04/21/14  Yes Kimber Relic, MD  ibuprofen (ADVIL,MOTRIN) 200 MG tablet Take 200 mg by mouth. Take 1-2 tablets as needed   Yes Historical Provider, MD  loratadine (CLARITIN)  10 MG tablet Take 10 mg by mouth daily as needed for allergies.   Yes Historical Provider, MD  losartan (COZAAR) 50 MG tablet TAKE 1 TABLET EVERY DAY TO CONTROL BLOOD PRESSURE 07/31/14  Yes Sharon SellerJessica K Eubanks, NP  Multiple Vitamins-Minerals (ICAPS) CAPS Take 1 capsule by mouth 2 (two) times daily.    Yes Historical Provider, MD  polyethylene glycol (MIRALAX / GLYCOLAX) packet Take 17 g by mouth as needed.   Yes Historical Provider, MD   Physical Exam: Filed Vitals:   08/31/14 1431 08/31/14 1500 08/31/14 1530  08/31/14 1651  BP: 185/84 191/75 179/85 215/103  Pulse: 56 57  59  Temp:    98 F (36.7 C)  TempSrc:    Oral  Resp: 21 14 13    Height:    4\' 11"  (1.499 m)  Weight:    59.7 kg (131 lb 9.8 oz)  SpO2: 96% 96%  100%    Wt Readings from Last 3 Encounters:  08/31/14 59.7 kg (131 lb 9.8 oz)  01/09/14 58.968 kg (130 lb)  08/29/13 58.514 kg (129 lb)    General:  Appears calm and comfortable Eyes: PERRL, normal lids, irises & conjunctiva ENT: grossly normal hearing, lips & tongue Neck: no LAD, masses or thyromegaly Cardiovascular: RRR, no m/r/g. No LE edema. Respiratory: CTA bilaterally, no w/r/r. Normal respiratory effort. Abdomen: soft, ntnd Skin: no rash or induration seen on limited exam Musculoskeletal: grossly normal tone BUE/BLE Neurologic: speech normal. No facila droop, motor strength equal in all extremities. No sensory deficits. No ataxia.           Labs on Admission:  Basic Metabolic Panel:  Recent Labs Lab 08/31/14 1404 08/31/14 1426  NA 140 143  K 3.1* 3.0*  CL 105 107  CO2 22  --   GLUCOSE 100* 99  BUN 17 16  CREATININE 0.76 0.70  CALCIUM 9.2  --    Liver Function Tests:  Recent Labs Lab 08/31/14 1404  AST 17  ALT 11  ALKPHOS 93  BILITOT 0.3  PROT 7.3  ALBUMIN 3.2*   No results for input(s): LIPASE, AMYLASE in the last 168 hours. No results for input(s): AMMONIA in the last 168 hours. CBC:  Recent Labs Lab 08/31/14 1404 08/31/14 1426  WBC 7.8  --   NEUTROABS 4.4  --   HGB 13.4 13.9  HCT 41.1 41.0  MCV 92.6  --   PLT 217  --    Cardiac Enzymes: No results for input(s): CKTOTAL, CKMB, CKMBINDEX, TROPONINI in the last 168 hours.  BNP (last 3 results) No results for input(s): PROBNP in the last 8760 hours. CBG: No results for input(s): GLUCAP in the last 168 hours.  Radiological Exams on Admission: Ct Head Wo Contrast  08/31/2014   CLINICAL DATA:  Right leg weakness and slurred speech. New onset of dysphagia.  EXAM: CT HEAD WITHOUT  CONTRAST  TECHNIQUE: Contiguous axial images were obtained from the base of the skull through the vertex without intravenous contrast.  COMPARISON:  None.  FINDINGS: Sinuses/Soft tissues: Mucosal thickening of the sphenoid sinuses. Clear mastoid air cells.  Intracranial: Expected cerebral atrophy. Moderate to marked low density in the periventricular white matter likely related to small vessel disease. Physiologic calcifications within the basal ganglia. Remote lacunar infarct in left caudate head. No mass lesion, hemorrhage, hydrocephalus, acute infarct, intra-axial, or extra-axial fluid collection.  Vertebral and carotid atherosclerosis.  IMPRESSION: 1.  No acute intracranial abnormality. 2.  Cerebral atrophy and small vessel ischemic change.  3. Sinus disease.   Electronically Signed   By: Jeronimo GreavesKyle  Talbot M.D.   On: 08/31/2014 14:46    EKG: sinus with premature atrial complexes.  Assessment/Plan Active Problems:   TIA (transient ischemic attack)   TIA: Admitted to telemetry. Her symptoms have completely TIA work up with MRI brain, MRA head and neck, echo and us dopplers ordered.  Aspirin 325 mg daily ordered.  Neurology consulted .  Lipid panel and hgba1c ordered.    Hypokalemia replete as needed.   Code Status: full code.  DVT Prophylaxis: Family Communication: discussed with daughter at bedside Disposition Plan: admit to telemetry.   Time spent: 55 min  [facility],Debbie Bellucci [facility] Pager [phone]

## 2014-08-31 NOTE — ED Notes (Signed)
Hospitalist at bedside 

## 2014-09-01 ENCOUNTER — Observation Stay (HOSPITAL_COMMUNITY): Payer: Medicare Other

## 2014-09-01 DIAGNOSIS — R001 Bradycardia, unspecified: Secondary | ICD-10-CM

## 2014-09-01 DIAGNOSIS — G458 Other transient cerebral ischemic attacks and related syndromes: Secondary | ICD-10-CM

## 2014-09-01 DIAGNOSIS — I359 Nonrheumatic aortic valve disorder, unspecified: Secondary | ICD-10-CM

## 2014-09-01 DIAGNOSIS — G459 Transient cerebral ischemic attack, unspecified: Secondary | ICD-10-CM | POA: Diagnosis not present

## 2014-09-01 DIAGNOSIS — E876 Hypokalemia: Secondary | ICD-10-CM | POA: Diagnosis not present

## 2014-09-01 DIAGNOSIS — M1990 Unspecified osteoarthritis, unspecified site: Secondary | ICD-10-CM | POA: Diagnosis not present

## 2014-09-01 DIAGNOSIS — F329 Major depressive disorder, single episode, unspecified: Secondary | ICD-10-CM | POA: Diagnosis not present

## 2014-09-01 LAB — LIPID PANEL
CHOLESTEROL: 198 mg/dL (ref 0–200)
HDL: 46 mg/dL (ref >39)
LDL Cholesterol: 132 mg/dL — ABNORMAL HIGH (ref 0–99)
TRIGLYCERIDES: 101 mg/dL (ref <150)
Total CHOL/HDL Ratio: 4.3 RATIO
VLDL: 20 mg/dL (ref 0–40)

## 2014-09-01 LAB — HEMOGLOBIN A1C
HEMOGLOBIN A1C: 5.9 % — AB (ref <5.7)
Mean Plasma Glucose: 123 mg/dL — ABNORMAL HIGH (ref <117)

## 2014-09-01 NOTE — Progress Notes (Signed)
VASCULAR LAB PRELIMINARY  PRELIMINARY  PRELIMINARY  PRELIMINARY  Carotid duplex completed.    Preliminary report:  Bilateral:  1-39% ICA stenosis.  Vertebral artery flow is antegrade on the right. Left Vertebral artery flow could not be insonated due to movement of the head and cardiac arrhythmia    Willma Obando, RVS 09/01/2014, 8:58 AM

## 2014-09-01 NOTE — Evaluation (Signed)
Occupational Therapy Evaluation Patient Details Name: Alice Swanson MRN: [id number] DOB: [birth year] Today's Date: 09/01/2014    History of Present Illness 80 yo female admitted with TIA-slurred speech. Hx of HTN, glaucoma, sciatica, macular degeneration. Pt is from [facility].    Clinical Impression   Pt admitted with TIA. Pt currently with functional limitations due to the deficits listed below (see OT Problem List).  Pt will benefit from skilled OT to increase their safety and independence with ADL and functional mobility for ADL to facilitate discharge to venue listed below.      Follow Up Recommendations  CIR          Precautions / Restrictions Precautions Precautions: Fall Restrictions Weight Bearing Restrictions: No      Mobility Bed Mobility Overal bed mobility: Needs Assistance Bed Mobility: Supine to Sit     Supine to sit: Min guard        Transfers Overall transfer level: Needs assistance Equipment used: 1 person hand held assist Transfers: Sit to/from Stand Sit to Stand: Min assist         General transfer comment: assist to rise, stabilize, control descent. Multimodal cues for safety, technique, hand placement. Pt c/o dizziness     Balance Overall balance assessment: Needs assistance         Standing balance support: Bilateral upper extremity supported;During functional activity Standing balance-Leahy Scale: Poor                              ADL Overall ADL's : Needs assistance/impaired     Grooming: Minimal assistance;Sitting   Upper Body Bathing: Minimal assitance;Sitting   Lower Body Bathing: Moderate assistance;Sit to/from stand       Lower Body Dressing: Moderate assistance;Sit to/from stand   Toilet Transfer: Minimal assistance;RW;BSC   Toileting- ArchitectClothing Manipulation and Hygiene: Moderate assistance;Sit to/from stand         General ADL Comments: pt will benefit from post acute rehab to increase I with ADL  activity prior to returning to I living               Pertinent Vitals/Pain Pain Assessment: No/denies pain Faces Pain Scale: Hurts little more Pain Location: head Pain Descriptors / Indicators: Aching Pain Intervention(s): Monitored during session     Hand Dominance     Extremity/Trunk Assessment Upper Extremity Assessment Upper Extremity Assessment: Generalized weakness   Lower Extremity Assessment Lower Extremity Assessment: RLE deficits/detail;LLE deficits/detail RLE Deficits / Details: Strength at least 4/5, except hip flex ~3+/5 LLE Deficits / Details: Strength at least 4/5   Cervical / Trunk Assessment Cervical / Trunk Assessment: Normal   Communication Communication Communication: No difficulties   Cognition Arousal/Alertness: Awake/alert Behavior During Therapy: WFL for tasks assessed/performed Overall Cognitive Status: Impaired/Different from baseline Area of Impairment: Problem solving             Problem Solving: Difficulty sequencing;Requires tactile cues;Requires verbal cues;Slow processing     General Comments    pt will benefit from post acute rehab to increase I with ADL activity prior to returning to I living            Home Living Family/patient expects to be discharged to:: Private residence Living Arrangements: Alone   Type of Home: Independent living facility ([facility]) Home Access: Level entry     Home Layout: One level     Bathroom Shower/Tub: Producer, television/film/videoWalk-in shower   Bathroom Toilet: Standard  Home Equipment: Walker - 4 wheels          Prior Functioning/Environment Level of Independence: Independent with assistive device(s)             OT Diagnosis: Generalized weakness   OT Problem List: Decreased strength;Decreased activity tolerance;Decreased safety awareness;Impaired balance (sitting and/or standing)   OT Treatment/Interventions: Self-care/ADL training;DME and/or AE instruction;Patient/family education     OT Goals(Current goals can be found in the care plan section) Acute Rehab OT Goals Patient Stated Goal: home. return to normal OT Goal Formulation: With patient Time For Goal Achievement: 09/15/14 Potential to Achieve Goals: Good  OT Frequency: Min 2X/week   Barriers to Swanson/C:               End of Session Nurse Communication: Mobility status  Activity Tolerance: Patient tolerated treatment well Patient left: in bed;with call bell/phone within reach;with family/visitor present   Time: [id number] OT Time Calculation (min): 21 min Charges:  OT General Charges $OT Visit: 1 Procedure OT Evaluation $Initial OT Evaluation Tier I: 1 Procedure OT Treatments $Self Care/Home Management : 8-22 mins G-Codes: OT G-codes **NOT FOR INPATIENT CLASS** Functional Assessment Tool Used: clinical observation Functional Limitation: Self care Self Care Current Status (V4098(G8987): At least 40 percent but less than 60 percent impaired, limited or restricted Self Care Goal Status (J1914(G8988): At least 1 percent but less than 20 percent impaired, limited or restricted  Brodrick Curran, Alice Swanson 09/01/2014, 12:41 PM

## 2014-09-01 NOTE — Progress Notes (Signed)
Patient was screened by Weldon PickingSusan Delainie Chavana for appropriateness for an Inpatient Acute Rehab consult.  At this time, we are not recommending Inpatient Rehab consult due to pt. is currently at "observation status" and likely would not meet medical necessity for IP Rehab admission.  [facility]  Weldon PickingSusan Yaritza Leist PT Inpatient Rehab Admissions Coordinator Cell [phone] Office [phone]

## 2014-09-01 NOTE — Progress Notes (Signed)
Clinical Social Work Department BRIEF PSYCHOSOCIAL ASSESSMENT 09/01/2014  Patient:  Alice Swanson,Alice Swanson     Account Number:  [id number]     Admit date:  08/31/2014  Clinical Social Worker:  Orpah GreekFOLEY,Cadi Rhinehart, LCSWA  Date/Time:  09/01/2014 04:30 PM  Referred by:  Physician  Date Referred:  09/01/2014 Referred for  Other - See comment   Other Referral:   Admitted from: [facility]   Interview type:  Family Other interview type:   patient's daughter, Raynelle FanningJulie via phone    PSYCHOSOCIAL DATA Living Status:  FACILITY Admitted from facility:  [facility] Level of care:  Independent Living Primary support name:  Willette PaJulie Brown (daughter) h#: [phone] c#: [phone] Primary support relationship to patient:  CHILD, ADULT Degree of support available:   good    CURRENT CONCERNS Current Concerns  Post-Acute Placement   Other Concerns:    SOCIAL WORK ASSESSMENT / PLAN CSW received consult for SNF placement.   Assessment/plan status:  [facility] Other assessment/ plan:   Information/referral to community resources:   CSW completed FL[facility]    PATIENT'S/FAMILY'S RESPONSE TO PLAN OF CARE: Patient & daughter are agreeable with SNF placement for rehab. CS[facility] for insurance authorization. CSW confirmed with Katie @ [facility] that they would be able to take patient when ready.       Lincoln MaxinKelly Azavier Creson, LCSW [facility] Clinical Social Worker cell #: [phone]

## 2014-09-01 NOTE — Progress Notes (Signed)
UR completed 

## 2014-09-01 NOTE — Evaluation (Signed)
Physical Therapy Evaluation Patient Details Name: Alice Swanson MRN: [id number] DOB: [birth year] Today's Date: 09/01/2014   History of Present Illness  80 yo female admitted with TIA-slurred speech. Hx of HTN, glaucoma, sciatica, macular degeneration. [facility] is from [facility].   Clinical Impression  On eval, pt was Min assist for mobility-walked ~10 feet with RW. Pt was unsteady and had difficulty advancing R LE during swing phase of gait. Pt tended to leave R leg behind. Assist needed to stabilize and maneuver with RW as well. Pt c/o dizziness during ambulation. Noted pt's speech to be slow and slurred-nursing aware of this. At this time, do not feel pt is safe to d/c home alone to [facility]. Recommend CIR consult. If CIR is not an option, then [facility]     Follow Up Recommendations CIR    Equipment Recommendations   (to be determined. Pt has 4 wheeled walker already)    Recommendations for Other Services OT consult;Rehab consult     Precautions / Restrictions Precautions Precautions: Fall Restrictions Weight Bearing Restrictions: No      Mobility  Bed Mobility Overal bed mobility: Modified Independent                Transfers Overall transfer level: Needs assistance Equipment used: Rolling walker (2 wheeled) Transfers: Sit to/from Stand Sit to Stand: Min assist         General transfer comment: assist to rise, stabilize, control descent. Multimodal cues for safety, technique, hand placement. Pt c/o dizziness   Ambulation/Gait Ambulation/Gait assistance: Min assist Ambulation Distance (Feet): 10 Feet Assistive device: Rolling walker (2 wheeled) Gait Pattern/deviations: Step-to pattern;Decreased stride length;Decreased step length - right     General Gait Details: Pt had difficulty advancing R LE-tended to leave it behind. Max cueing to bring leg fully through during swing phase. Assist to stabilize and maneuver safely with walker.   Stairs             Wheelchair Mobility    Modified Rankin (Stroke Patients Only)       Balance Overall balance assessment: Needs assistance         Standing balance support: Bilateral upper extremity supported;During functional activity Standing balance-Leahy Scale: Poor                               Pertinent Vitals/Pain Pain Assessment: Faces Faces Pain Scale: Hurts little more Pain Location: head Pain Descriptors / Indicators: Aching Pain Intervention(s): Monitored during session    Home Living Family/patient expects to be discharged to:: Private residence Living Arrangements: Alone   Type of Home: Independent living facility ([facility]) Home Access: Level entry     Home Layout: One level Home Equipment: Environmental consultantWalker - 4 wheels      Prior Function Level of Independence: Independent with assistive device(s)               Hand Dominance        Extremity/Trunk Assessment   Upper Extremity Assessment: Defer to OT evaluation           Lower Extremity Assessment: RLE deficits/detail;LLE deficits/detail RLE Deficits / Details: Strength at least 4/5, except hip flex ~3+/5 LLE Deficits / Details: Strength at least 4/5  Cervical / Trunk Assessment: Normal  Communication   Communication: No difficulties  Cognition Arousal/Alertness: Awake/alert Behavior During Therapy: WFL for tasks assessed/performed Overall Cognitive Status: Impaired/Different from baseline Area of Impairment: Problem solving  Problem Solving: Difficulty sequencing;Requires tactile cues;Requires verbal cues;Slow processing      General Comments      Exercises        Assessment/Plan    PT Assessment Patient needs continued PT services  PT Diagnosis Difficulty walking;Abnormality of gait;Generalized weakness;Acute pain   PT Problem List Decreased strength;Decreased activity tolerance;Decreased balance;Decreased mobility;Decreased knowledge of use of  DME;Pain;Decreased coordination  PT Treatment Interventions DME instruction;Gait training;Functional mobility training;Therapeutic activities;Therapeutic exercise;Balance training;Patient/family education;Neuromuscular re-education   PT Goals (Current goals can be found in the Care Plan section) Acute Rehab PT Goals Patient Stated Goal: home. return to normal PT Goal Formulation: With patient/family Time For Goal Achievement: 09/15/14 Potential to Achieve Goals: Good    Frequency Min 4X/week   Barriers to discharge        Co-evaluation               End of Session Equipment Utilized During Treatment: Gait belt Activity Tolerance: Patient limited by fatigue Patient left: in bed;with call bell/phone within reach;with bed alarm set           Time: [id number] PT Time Calculation (min) (ACUTE ONLY): 22 min   Charges:   PT Evaluation $Initial PT Evaluation Tier I: 1 Procedure PT Treatments $Gait Training: 8-22 mins   PT G Codes:          Rebeca AlertJannie Hugo Swanson, MPT Pager: [phone]

## 2014-09-01 NOTE — Progress Notes (Signed)
Echo Lab  2D Echocardiogram completed.  Korynn Kenedy L Hamad Whyte, RDCS 09/01/2014 12:20 PM

## 2014-09-01 NOTE — Progress Notes (Signed)
CARE MANAGEMENT NOTE 09/01/2014  Patient:  Alice Swanson,Alice Swanson   Account Number:  [id number]  Date Initiated:  09/01/2014  Documentation initiated by:  Lanier ClamMAHABIR,Emmaus Brandi  Subjective/Objective Assessment:   80 Y/O F ADMITTED W/TIA     Action/Plan:   FROM [facility]   Anticipated DC Date:  09/02/2014   Anticipated DC Plan:  SKILLED NURSING FACILITY      DC Planning Services  CM consult      Choice offered to / List presented to:             Status of service:  In process, will continue to follow Medicare Important Message given?   (If response is "NO", the following Medicare IM given date fields will be blank) Date Medicare IM given:   Medicare IM given by:   Date Additional Medicare IM given:   Additional Medicare IM given by:    Discharge Disposition:    Per UR Regulation:  Reviewed for med. necessity/level of care/duration of stay  If discussed at Long Length of Stay Meetings, dates discussed:    Comments:  09/01/14 Katty Fretwell RN,BSN NCM [phone] PT-CIR-DUE TO INSURANCE NOT APPROPRIATE FOR CIR.D/C PLAN SNF-SW FOLLOWING.

## 2014-09-01 NOTE — Progress Notes (Signed)
CCMD called and said that pt. Was going in and out of 2nd degree heartblock type two. Pt unsymptomatic.  Dr. Ardyth HarpsHernandez was made aware. EKG taken and looked at with charge nurse.  Told pt to immediately call if she started feeling different.  Will continue to monitor closely.

## 2014-09-01 NOTE — Progress Notes (Signed)
   09/01/14 1157  PT Time Calculation  PT Start Time (ACUTE ONLY) 1114  PT Stop Time (ACUTE ONLY) 1136  PT Time Calculation (min) (ACUTE ONLY) 22 min  PT G-Codes **NOT FOR INPATIENT CLASS**  Functional Assessment Tool Used (clinical judgement)  Functional Limitation Mobility: Walking and moving around  Mobility: Walking and Moving Around Current Status (W0981(G8978) CJ  Mobility: Walking and Moving Around Goal Status (X9147(G8979) CI  PT General Charges  $$ ACUTE PT VISIT 1 Procedure  PT Evaluation  $Initial PT Evaluation Tier I 1 Procedure  PT Treatments  $Gait Training 8-22 mins   Alice Swanson, MPT [phone]

## 2014-09-01 NOTE — Plan of Care (Signed)
Problem: Acute Treatment Outcomes Goal: BP within ordered parameters Outcome: Completed/Met Date Met:  09/01/14     

## 2014-09-01 NOTE — Evaluation (Signed)
SLP Cancellation Note  Patient Details Name: Alice Swanson MRN: [id number] DOB: Oct 13, 1920   Cancelled treatment:       Reason Eval/Treat Not Completed: Patient at procedure or test/unavailable  Donavan Burnetamara Arsen Mangione, MS [facility] SLP [phone]

## 2014-09-01 NOTE — Progress Notes (Addendum)
[facility] PROGRESS NOTE  Alice Swanson AOZ:[id number] DOB: 1920/05/02 DOA: 08/31/2014 PCP: Kimber RelicGREEN, ARTHUR G, MD  Assessment/Plan: TIA -Symptoms mainly resolved, altho RN reports worsening slurred speech. Consider repeat MRI if persists. -Still weak and PT/OT is recommending SNF. SW aware. -ECHO: Left ventricle: The cavity size was normal. Systolic function was normal. The estimated ejection fraction was in the range of 55% to 60%. Wall motion was normal; there were no regional wall motion abnormalities. Features are consistent with a pseudonormal left ventricular filling pattern, with concomitant abnormal relaxation and increased filling pressure (grade 2 diastolic dysfunction). -Carotid Dopplers: Bilateral - 1% to 39% ICA stenosis.  -Continue ASA for secondary CVA prevention.  Bradycardia -EKG with 1st degree block and marked sinus arrhythmia.   HTN -Fair control.  Code Status: Full Code Family Communication: Patient only  Disposition Plan: SNF likely in 24-48 hours   Consultants:  Neuro   Antibiotics:  None   Subjective: Increased slurred  speech today.  Objective: Filed Vitals:   09/01/14 0317 09/01/14 0531 09/01/14 1010 09/01/14 1453  BP: 140/60 140/47 152/45 128/39  Pulse: 70 66 51 67  Temp:  98.1 F (36.7 C) 98.4 F (36.9 C) 98.1 F (36.7 C)  TempSrc:  Oral Oral Oral  Resp: 16 16 16 18   Height:      Weight:      SpO2: 97% 98% 97% 98%    Intake/Output Summary (Last 24 hours) at 09/01/14 1706 Last data filed at 09/01/14 1449  Gross per 24 hour  Intake    963 ml  Output      0 ml  Net    963 ml   Filed Weights   08/31/14 1315 08/31/14 1651  Weight: 60.328 kg (133 lb) 59.7 kg (131 lb 9.8 oz)    Exam:   General:  Awake, alert  Cardiovascular: RRR  Respiratory: CTA B  Abdomen: S/NT/ND/+BS  Extremities: trace bilateral edema   Neurologic:  Follows commands  Data Reviewed: Basic Metabolic Panel:  Recent  Labs Lab 08/31/14 1404 08/31/14 1426  NA 140 143  K 3.1* 3.0*  CL 105 107  CO2 22  --   GLUCOSE 100* 99  BUN 17 16  CREATININE 0.76 0.70  CALCIUM 9.2  --    Liver Function Tests:  Recent Labs Lab 08/31/14 1404  AST 17  ALT 11  ALKPHOS 93  BILITOT 0.3  PROT 7.3  ALBUMIN 3.2*   No results for input(s): LIPASE, AMYLASE in the last 168 hours. No results for input(s): AMMONIA in the last 168 hours. CBC:  Recent Labs Lab 08/31/14 1404 08/31/14 1426  WBC 7.8  --   NEUTROABS 4.4  --   HGB 13.4 13.9  HCT 41.1 41.0  MCV 92.6  --   PLT 217  --    Cardiac Enzymes: No results for input(s): CKTOTAL, CKMB, CKMBINDEX, TROPONINI in the last 168 hours. BNP (last 3 results) No results for input(s): PROBNP in the last 8760 hours. CBG: No results for input(s): GLUCAP in the last 168 hours.  No results found for this or any previous visit (from the past 240 hour(s)).   Studies: Dg Chest 2 View  08/31/2014   CLINICAL DATA:  Cough  EXAM: CHEST  2 VIEW  COMPARISON:  None.  FINDINGS: Cardiac shadow is at the upper limits of normal in size. Mild interstitial changes are noted without acute infiltrate. No acute bony abnormality is noted.  IMPRESSION: Mild chronic interstitial changes without acute abnormality.  Electronically Signed   By: Alcide CleverMark  Lukens M.D.   On: 08/31/2014 19:01   Ct Head Wo Contrast  08/31/2014   CLINICAL DATA:  Right leg weakness and slurred speech. New onset of dysphagia.  EXAM: CT HEAD WITHOUT CONTRAST  TECHNIQUE: Contiguous axial images were obtained from the base of the skull through the vertex without intravenous contrast.  COMPARISON:  None.  FINDINGS: Sinuses/Soft tissues: Mucosal thickening of the sphenoid sinuses. Clear mastoid air cells.  Intracranial: Expected cerebral atrophy. Moderate to marked low density in the periventricular white matter likely related to small vessel disease. Physiologic calcifications within the basal ganglia. Remote lacunar infarct  in left caudate head. No mass lesion, hemorrhage, hydrocephalus, acute infarct, intra-axial, or extra-axial fluid collection.  Vertebral and carotid atherosclerosis.  IMPRESSION: 1.  No acute intracranial abnormality. 2.  Cerebral atrophy and small vessel ischemic change. 3. Sinus disease.   Electronically Signed   By: Jeronimo GreavesKyle  Talbot M.D.   On: 08/31/2014 14:46   Mr Maxine GlennMra Head Wo Contrast  09/01/2014   CLINICAL DATA:  Right-sided weakness.  Dysphasia  EXAM: MRI HEAD WITHOUT CONTRAST  MRA HEAD WITHOUT CONTRAST  TECHNIQUE: Multiplanar, multiecho pulse sequences of the brain and surrounding structures were obtained without intravenous contrast. Angiographic images of the head were obtained using MRA technique without contrast.  COMPARISON:  Head CT 08/31/2014  FINDINGS: MRI HEAD FINDINGS  Diffusion imaging does not show any acute or subacute infarction. There are chronic small-vessel ischemic changes throughout the pons. There are old small vessel cerebellar infarctions. The cerebral hemispheres show chronic small-vessel ischemic changes throughout the thalami, basal ganglia and diffusely affecting the cerebral hemispheric white matter. No large vessel territory infarction. No mass lesion, hemorrhage, hydrocephalus or extra-axial collection. No pituitary mass. There is mucosal inflammation affecting the sphenoid sinus which appears chronically opacified.  MRA HEAD FINDINGS  Both internal carotid arteries are patent through the skullbase. No siphon stenosis. The anterior and middle cerebral vessels are patent bilaterally but there are focal stenoses in the M1 and A1 segments on the right. The M1 stenosis is approximately 50% and would place the patient at risk of right MCA territory infarction.  Both vertebral arteries are widely patent to the basilar. No basilar stenosis. Posterior circulation branch vessels are patent, but there is narrowing and irregularity diffusely affecting the superior cerebellar and posterior  cerebral arteries. PCA disease is more severe on the right than the left.  IMPRESSION: No acute infarction. Extensive chronic small vessel ischemic changes throughout the brain as outlined above.  No major vessel intracranial occlusion. Focal stenoses within the A1 and M1 segments on the right. 50% stenosis of the right M1 segment for place the patient at risk of right MCA territory infarction. Diffuse atherosclerotic narrowing and irregularity within the major posterior circulation branch vessels.   Electronically Signed   By: Paulina FusiMark  Shogry M.D.   On: 09/01/2014 08:00   Mr Brain Wo Contrast  09/01/2014   CLINICAL DATA:  Right-sided weakness.  Dysphasia  EXAM: MRI HEAD WITHOUT CONTRAST  MRA HEAD WITHOUT CONTRAST  TECHNIQUE: Multiplanar, multiecho pulse sequences of the brain and surrounding structures were obtained without intravenous contrast. Angiographic images of the head were obtained using MRA technique without contrast.  COMPARISON:  Head CT 08/31/2014  FINDINGS: MRI HEAD FINDINGS  Diffusion imaging does not show any acute or subacute infarction. There are chronic small-vessel ischemic changes throughout the pons. There are old small vessel cerebellar infarctions. The cerebral hemispheres show chronic small-vessel ischemic changes  throughout the thalami, basal ganglia and diffusely affecting the cerebral hemispheric white matter. No large vessel territory infarction. No mass lesion, hemorrhage, hydrocephalus or extra-axial collection. No pituitary mass. There is mucosal inflammation affecting the sphenoid sinus which appears chronically opacified.  MRA HEAD FINDINGS  Both internal carotid arteries are patent through the skullbase. No siphon stenosis. The anterior and middle cerebral vessels are patent bilaterally but there are focal stenoses in the M1 and A1 segments on the right. The M1 stenosis is approximately 50% and would place the patient at risk of right MCA territory infarction.  Both vertebral  arteries are widely patent to the basilar. No basilar stenosis. Posterior circulation branch vessels are patent, but there is narrowing and irregularity diffusely affecting the superior cerebellar and posterior cerebral arteries. PCA disease is more severe on the right than the left.  IMPRESSION: No acute infarction. Extensive chronic small vessel ischemic changes throughout the brain as outlined above.  No major vessel intracranial occlusion. Focal stenoses within the A1 and M1 segments on the right. 50% stenosis of the right M1 segment for place the patient at risk of right MCA territory infarction. Diffuse atherosclerotic narrowing and irregularity within the major posterior circulation branch vessels.   Electronically Signed   By: Paulina Fusi M.D.   On: 09/01/2014 08:00    Scheduled Meds: .  stroke: mapping our early stages of recovery book   Does not apply Once  . dorzolamide-timolol  1 drop Both Eyes BID  . enoxaparin (LOVENOX) injection  40 mg Subcutaneous Q24H  . FLUoxetine  20 mg Oral Daily  . losartan  50 mg Oral Daily  . sodium chloride  3 mL Intravenous Q12H   Continuous Infusions:   Principal Problem:   TIA (transient ischemic attack) Active Problems:   Hypertension   Bradycardia    Time spent: 35 minutes. Greater than 50% of this time was spent in direct contact with the patient coordinating care.    Chaya Jan  [facility] Pager [phone]  If 7PM-7AM, please contact night-coverage at [email or web address], password [facility] 09/01/2014, 5:06 PM  LOS: 1 day

## 2014-09-01 NOTE — Consult Note (Signed)
Referring Physician: Philip AspenHERNANDEZ ACOSTA, E    Chief Complaint: new-onset right-sided weakness and slurred speech  HPI: Henderson Baltimoremily Pulsifer is an 80 y.o. female with a history of hypertension and hyperlipidemia who experienced new onset speech difficulty and right-sided weakness on 08/31/2014. She was last known well at 8 AM on the morning. Symptoms subsequently cleared, but tended to fluctuate. She has no previous history of stroke nor TIA, and has not been on antiplatelet therapy. CT scan of her head showed no acute intracranial abnormality. MRI showed no acute stroke. MRA showed 50% stenosis on the right.  LSN: 8:30 AM on 08/31/2014 tPA Given: No: deficits resolved MRankin:   Past Medical History  Diagnosis Date  . Arthritis   . Depression   . Glaucoma     both eyes  . Hyperlipidemia   . Hypertension   . Cardiac arrhythmia due to congenital heart disease   . Sciatica   . Anxiety   . Allergic rhinitis   . Macular degeneration of both eyes     Family History  Problem Relation Age of Onset  . Heart disease Mother     MI  . Stroke Father      Medications: I have reviewed the patient's current medications.  ROS: History obtained from patient's daughter, chart review and the patient  General ROS: negative for - chills, fatigue, fever, night sweats, weight gain or weight loss Psychological ROS: negative for - behavioral disorder, hallucinations, memory difficulties, mood swings or suicidal ideation Ophthalmic ROS: negative for - blurry vision, double vision, eye pain or loss of vision ENT ROS: negative for - epistaxis, nasal discharge, oral lesions, sore throat, tinnitus or vertigo Allergy and Immunology ROS: negative for - hives or itchy/watery eyes Hematological and Lymphatic ROS: negative for - bleeding problems, bruising or swollen lymph nodes Endocrine ROS: negative for - galactorrhea, hair pattern changes, polydipsia/polyuria or temperature intolerance Respiratory ROS: negative for  - cough, hemoptysis, shortness of breath or wheezing Cardiovascular ROS: negative for - chest pain, dyspnea on exertion, edema or irregular heartbeat Gastrointestinal ROS: negative for - abdominal pain, diarrhea, hematemesis, nausea/vomiting or stool incontinence Genito-Urinary ROS: negative for - dysuria, hematuria, incontinence or urinary frequency/urgency Musculoskeletal ROS: negative for - joint swelling or muscular weakness Neurological ROS: as noted in HPI Dermatological ROS: negative for rash and skin lesion changes  Physical Examination: Blood pressure 152/45, pulse 51, temperature 98.4 F (36.9 C), temperature source Oral, resp. rate 16, height 4\' 11"  (1.499 m), weight 59.7 kg (131 lb 9.8 oz), SpO2 97 %.  Neurologic Examination: Mental Status: Alert, oriented, thought content appropriate.  Speech slightly hesitant, but otherwise unremarkable. Able to follow commands without difficulty. Cranial Nerves: II-Visual fields were normal. III/IV/VI-Pupils were equal and reacted. Extraocular movements were full and conjugate.    V/VII-no facial numbness; mild facial asymmetry suggestive of possible old left lower facial weakness VIII-normal. X-slightly slurred speech and symmetrical palatal movement. XII-midline tongue extension Motor: 5/5 bilaterally with normal tone and bulk Sensory: Normal throughout. Deep Tendon Reflexes: 1+ and symmetric. Plantars: extensor bilaterally Cerebellar: Normal finger-to-nose testing. Carotid auscultation: Normal  Dg Chest 2 View  08/31/2014   CLINICAL DATA:  Cough  EXAM: CHEST  2 VIEW  COMPARISON:  None.  FINDINGS: Cardiac shadow is at the upper limits of normal in size. Mild interstitial changes are noted without acute infiltrate. No acute bony abnormality is noted.  IMPRESSION: Mild chronic interstitial changes without acute abnormality.   Electronically Signed   By: Eulah PontMark  Lukens M.D.  On: 08/31/2014 19:01   Ct Head Wo Contrast  08/31/2014    CLINICAL DATA:  Right leg weakness and slurred speech. New onset of dysphagia.  EXAM: CT HEAD WITHOUT CONTRAST  TECHNIQUE: Contiguous axial images were obtained from the base of the skull through the vertex without intravenous contrast.  COMPARISON:  None.  FINDINGS: Sinuses/Soft tissues: Mucosal thickening of the sphenoid sinuses. Clear mastoid air cells.  Intracranial: Expected cerebral atrophy. Moderate to marked low density in the periventricular white matter likely related to small vessel disease. Physiologic calcifications within the basal ganglia. Remote lacunar infarct in left caudate head. No mass lesion, hemorrhage, hydrocephalus, acute infarct, intra-axial, or extra-axial fluid collection.  Vertebral and carotid atherosclerosis.  IMPRESSION: 1.  No acute intracranial abnormality. 2.  Cerebral atrophy and small vessel ischemic change. 3. Sinus disease.   Electronically Signed   By: Jeronimo GreavesKyle  Talbot M.D.   On: 08/31/2014 14:46   Mr Maxine GlennMra Head Wo Contrast  09/01/2014   CLINICAL DATA:  Right-sided weakness.  Dysphasia  EXAM: MRI HEAD WITHOUT CONTRAST  MRA HEAD WITHOUT CONTRAST  TECHNIQUE: Multiplanar, multiecho pulse sequences of the brain and surrounding structures were obtained without intravenous contrast. Angiographic images of the head were obtained using MRA technique without contrast.  COMPARISON:  Head CT 08/31/2014  FINDINGS: MRI HEAD FINDINGS  Diffusion imaging does not show any acute or subacute infarction. There are chronic small-vessel ischemic changes throughout the pons. There are old small vessel cerebellar infarctions. The cerebral hemispheres show chronic small-vessel ischemic changes throughout the thalami, basal ganglia and diffusely affecting the cerebral hemispheric white matter. No large vessel territory infarction. No mass lesion, hemorrhage, hydrocephalus or extra-axial collection. No pituitary mass. There is mucosal inflammation affecting the sphenoid sinus which appears chronically  opacified.  MRA HEAD FINDINGS  Both internal carotid arteries are patent through the skullbase. No siphon stenosis. The anterior and middle cerebral vessels are patent bilaterally but there are focal stenoses in the M1 and A1 segments on the right. The M1 stenosis is approximately 50% and would place the patient at risk of right MCA territory infarction.  Both vertebral arteries are widely patent to the basilar. No basilar stenosis. Posterior circulation branch vessels are patent, but there is narrowing and irregularity diffusely affecting the superior cerebellar and posterior cerebral arteries. PCA disease is more severe on the right than the left.  IMPRESSION: No acute infarction. Extensive chronic small vessel ischemic changes throughout the brain as outlined above.  No major vessel intracranial occlusion. Focal stenoses within the A1 and M1 segments on the right. 50% stenosis of the right M1 segment for place the patient at risk of right MCA territory infarction. Diffuse atherosclerotic narrowing and irregularity within the major posterior circulation branch vessels.   Electronically Signed   By: Paulina FusiMark  Shogry M.D.   On: 09/01/2014 08:00   Mr Brain Wo Contrast  09/01/2014   CLINICAL DATA:  Right-sided weakness.  Dysphasia  EXAM: MRI HEAD WITHOUT CONTRAST  MRA HEAD WITHOUT CONTRAST  TECHNIQUE: Multiplanar, multiecho pulse sequences of the brain and surrounding structures were obtained without intravenous contrast. Angiographic images of the head were obtained using MRA technique without contrast.  COMPARISON:  Head CT 08/31/2014  FINDINGS: MRI HEAD FINDINGS  Diffusion imaging does not show any acute or subacute infarction. There are chronic small-vessel ischemic changes throughout the pons. There are old small vessel cerebellar infarctions. The cerebral hemispheres show chronic small-vessel ischemic changes throughout the thalami, basal ganglia and diffusely affecting the cerebral hemispheric  white matter. No  large vessel territory infarction. No mass lesion, hemorrhage, hydrocephalus or extra-axial collection. No pituitary mass. There is mucosal inflammation affecting the sphenoid sinus which appears chronically opacified.  MRA HEAD FINDINGS  Both internal carotid arteries are patent through the skullbase. No siphon stenosis. The anterior and middle cerebral vessels are patent bilaterally but there are focal stenoses in the M1 and A1 segments on the right. The M1 stenosis is approximately 50% and would place the patient at risk of right MCA territory infarction.  Both vertebral arteries are widely patent to the basilar. No basilar stenosis. Posterior circulation branch vessels are patent, but there is narrowing and irregularity diffusely affecting the superior cerebellar and posterior cerebral arteries. PCA disease is more severe on the right than the left.  IMPRESSION: No acute infarction. Extensive chronic small vessel ischemic changes throughout the brain as outlined above.  No major vessel intracranial occlusion. Focal stenoses within the A1 and M1 segments on the right. 50% stenosis of the right M1 segment for place the patient at risk of right MCA territory infarction. Diffuse atherosclerotic narrowing and irregularity within the major posterior circulation branch vessels.   Electronically Signed   By: Paulina Fusi M.D.   On: 09/01/2014 08:00    Assessment: 80 y.o. female with multiple risk factors for stroke presenting with recurrent TIAs. MRI study showed no acute stroke.  Stroke Risk Factors - family history, hyperlipidemia and hypertension  Plan: 1. HgbA1c, fasting lipid panel 2. PT consult, OT consult, Speech consult 3. Echocardiogram 4. Carotid dopplers 5. Prophylactic therapy-Antiplatelet med: Aspirin 325 mg per day 6. Risk factor modification 7. Telemetry monitoring 8. Repeat MRI without contrast if slurred speech persists.   C.R. Roseanne Reno, MD [facility] [phone]  09/01/2014, 11:53 AM

## 2014-09-01 NOTE — Progress Notes (Signed)
Clinical Social Work Department CLINICAL SOCIAL WORK PLACEMENT NOTE 09/01/2014  Patient:  Alice Swanson,Alice Swanson  Account Number:  [id number] Admit date:  08/31/2014  Clinical Social Worker:  Orpah GreekKELLY FOLEY, LCSWA  Date/time:  09/01/2014 04:34 PM  Clinical Social Work is seeking post-discharge placement for this patient at the following level of care:   SKILLED NURSING   (*CSW will update this form in Epic as items are completed)   09/01/2014  [facility]/family provided with [facility] of Clinical Social Work's list of facilities offering this level of care within the geographic area requested by the patient (or if unable, by the patient's family).  09/01/2014  Patient/family informed of their freedom to choose among providers that offer the needed level of care, that participate in Medicare, Medicaid or managed care program needed by the patient, have an available bed and are willing to accept the patient.  09/01/2014  Patient/family informed of [facility]' ownership interest in [facility], as well as of the fact that they are under no obligation to receive care at this facility.  PASARR submitted to EDS on 09/01/2014 PASARR number received on 09/01/2014  FL2 transmitted to all facilities in geographic area requested by pt/family on  09/01/2014 FL2 transmitted to all facilities within larger geographic area on   Patient informed that his/her managed care company has contracts with or will negotiate with  certain facilities, including the following:     Patient/family informed of bed offers received:  09/01/2014 [facility] Phys[facility] recommends and patient chooses bed at    [facility]   Patient to be transferred to facility by  Patient and family notified of transfer on  Name of family member notified:    The following physician request were entered in Epic:   Additional  Comments:   Lincoln MaxinKelly Ezreal Turay, LCSW [facility] Clinical Social Worker cell #: [phone]

## 2014-09-02 DIAGNOSIS — I10 Essential (primary) hypertension: Secondary | ICD-10-CM

## 2014-09-02 MED ORDER — ASPIRIN 325 MG PO TABS
325.0000 mg | ORAL_TABLET | Freq: Every day | ORAL | Status: DC
  Administered 2014-09-02: 325 mg via ORAL
  Filled 2014-09-02: qty 1

## 2014-09-02 MED ORDER — ASPIRIN 325 MG PO TABS: 325.0000 mg | ORAL_TABLET | Freq: Every day | ORAL | Status: AC

## 2014-09-02 NOTE — Plan of Care (Signed)
Problem: Progression Outcomes Goal: Communication method established Outcome: Completed/Met Date Met:  09/02/14

## 2014-09-02 NOTE — Progress Notes (Signed)
Subjective: Patient had no new complaints. She is no longer having slurred speech. She has not experienced a recurrence of weakness or numbness focally.  Objective: Current vital signs: BP 148/51 mmHg  Pulse 65  Temp(Src) 97.8 F (36.6 C) (Oral)  Resp 20  Ht 4\' 11"  (1.499 m)  Wt 59.7 kg (131 lb 9.8 oz)  BMI 26.57 kg/m2  SpO2 98%  Neurologic Exam: Patient was alert and in no acute distress. Mental status was normal. No facial asymmetry was noted. Speech was normal with no dysarthria. She moved extremities equally with no focal weakness.  Carotid Doppler study showed no significant carotid artery stenosis. Fasting lipid panel showed slightly elevated total cholesterol and LDL. Hemoglobin A1c was 5.9. Echocardiogram is pending..  Medications: I have reviewed the patient's current medications.  Assessment/Plan: 80 year old lady who presented with transient ischemic attack, with subsequent resolution of symptoms. MRI showed no signs of an acute stroke. Echocardiogram is still pending. Aspirin was started at 325 mg per day. Dose may be reduced to 81 mg per day following discharge,, however.  No further neurological intervention is indicated.  C.R. Roseanne RenoStewart, MD [facility] [phone]  09/02/2014  12:14 PM

## 2014-09-02 NOTE — Care Management Note (Signed)
    Page 1 of 1   09/02/2014     3:59:02 PM CARE MANAGEMENT NOTE 09/02/2014  Patient:  Alice Swanson,Alice Swanson   Account Number:  [id number]  Date Initiated:  09/01/2014  Documentation initiated by:  Lanier ClamMAHABIR,Colen Eltzroth  Subjective/Objective Assessment:   80 Y/O F ADMITTED W/TIA     Action/Plan:   [facility]   Anticipated DC Date:  09/02/2014   Anticipated DC Plan:  SKILLED NURSING FACILITY      DC Planning Services  CM consult      Choice offered to / List presented to:             Status of service:  Completed, signed off Medicare Important Message given?   (If response is "NO", the following Medicare IM given date fields will be blank) Date Medicare IM given:   Medicare IM given by:   Date Additional Medicare IM given:   Additional Medicare IM given by:    Discharge Disposition:  SKILLED NURSING FACILITY  Per UR Regulation:  Reviewed for med. necessity/level of care/duration of stay  If discussed at Long Length of Stay Meetings, dates discussed:    Comments:  09/01/14 Nakyia Dau RN,BSN NCM [phone] PT-CIR-DUE TO INSURANCE NOT APPROPRIATE FOR CIR.D/C PLAN SNF-SW FOLLOWING.

## 2014-09-02 NOTE — Progress Notes (Signed)
Physical Therapy Treatment Patient Details Name: Henderson Baltimoremily Saar MRN: [id number] DOB: 1920-07-31 Today's Date: 09/02/2014    History of Present Illness 80 yo female admitted with TIA-slurred speech. Hx of HTN, glaucoma, sciatica, macular degeneration. [facility].     PT Comments    Improved speech and mobility this session. Overall, pt was Min guard assist. Continue to recommend SNF prior to pt returning home alone to [facility].   Follow Up Recommendations  SNF (feel a short stay in rehab is safest option prior to returning to [facility]. CIR not an option)     Equipment Recommendations  None recommended by PT    Recommendations for Other Services OT consult     Precautions / Restrictions Precautions Precautions: Fall Restrictions Weight Bearing Restrictions: No    Mobility  Bed Mobility Overal bed mobility: Needs Assistance Bed Mobility: Supine to Sit     Supine to sit: Supervision     General bed mobility comments: OOB in recliner  Transfers Overall transfer level: Needs assistance Equipment used: Rolling walker (2 wheeled) Transfers: Sit to/from [employer]Stand;Stand Pivot Transfers Sit to Stand: Min guard Stand pivot transfers: Min guard       General transfer comment: close guard for safety.  Ambulation/Gait Ambulation/Gait assistance: Min guard Ambulation Distance (Feet): 135 Feet Assistive device: Rolling walker (2 wheeled) Gait Pattern/deviations: Step-through pattern     General Gait Details: Improved ambulation today! close guard for safety. Pt tolerated well. Denied dizziness.    Stairs            Wheelchair Mobility    Modified Rankin (Stroke Patients Only)       Balance                                    Cognition Arousal/Alertness: Awake/alert Behavior During Therapy: WFL for tasks assessed/performed Overall Cognitive Status: Within Functional Limits for tasks assessed Area of Impairment: Problem solving              Problem Solving: Difficulty sequencing;Requires tactile cues;Requires verbal cues;Slow processing      Exercises      General Comments        Pertinent Vitals/Pain Pain Assessment: No/denies pain    Home Living                      Prior Function            PT Goals (current goals can now be found in the care plan section) Progress towards PT goals: Progressing toward goals    Frequency  Min 4X/week    PT Plan Current plan remains appropriate    Co-evaluation             End of Session Equipment Utilized During Treatment: Gait belt Activity Tolerance: Patient tolerated treatment well Patient left: in bed;with call bell/phone within reach     Time: 1040-1053 PT Time Calculation (min) (ACUTE ONLY): 13 min  Charges:  $Gait Training: 8-22 mins                    G Codes:      Rebeca AlertJannie Devonia Farro, MPT Pager: [phone]

## 2014-09-02 NOTE — Discharge Summary (Signed)
Physician Discharge Summary  Alice Swanson JYN:[id number] DOB: 1920-06-04 DOA: 08/31/2014  PCP: Kimber RelicGREEN, ARTHUR G, MD  Admit date: 08/31/2014 Discharge date: 09/02/2014  Time spent: 45 minutes  Recommendations for Outpatient Follow-up:  -Will be discharged to SNF today.   Discharge Diagnoses:  Principal Problem:   TIA (transient ischemic attack) Active Problems:   Hypertension   Bradycardia   Discharge Condition: Stable and improved  Filed Weights   08/31/14 1315 08/31/14 1651  Weight: 60.328 kg (133 lb) 59.7 kg (131 lb 9.8 oz)    History of present illness:  Alice Baltimoremily Schadt is a 80 y.o. female with h/o hypertension, depression, was brought in by her daughter for a brief episode of slurred speech and generalized weakness, and more weakness in the right leg. As per the daughter, she was heard normal over the phone around 8 30 am and later on around 11 she was found to have slurred speech and the patient reported weakness in the right leg. The daughter felt the patient was dragging her right leg when she was walking. Her symptoms resolved spontaneously within few min. She was brought to ED and CT didn't show any acute intracranial pathology. She was referred to medical service for admission for TIA. HER symptoms have completely resolved on neuro exam in ED. Neurology also consulted by EDP.   Hospital Course:   TIA -Symptoms resolved. -Still weak and PT/OT is recommending SNF.  -ECHO: Left ventricle: The cavity size was normal. Systolic function was normal. The estimated ejection fraction was in the range of 55% to 60%. Wall motion was normal; there were no regional wall motion abnormalities. Features are consistent with a pseudonormal left ventricular filling pattern, with concomitant abnormal relaxation and increased filling pressure (grade 2 diastolic dysfunction). -Carotid Dopplers: Bilateral - 1% to 39% ICA stenosis.  -Continue ASA for secondary CVA  prevention.  Bradycardia -EKG with 1st degree block and marked sinus arrhythmia.   HTN -Fair control.  Procedures:  None   Consultations:  Neurology, Dr. Roseanne RenoStewart  Discharge Instructions  Discharge Instructions    Diet - low sodium heart healthy    Complete by:  As directed      Increase activity slowly    Complete by:  As directed             Medication List    STOP taking these medications        ibuprofen 200 MG tablet  Commonly known as:  ADVIL,MOTRIN      TAKE these medications        aspirin 325 MG tablet  Take 1 tablet (325 mg total) by mouth daily.     dorzolamide-timolol 22.3-6.8 MG/ML ophthalmic solution  Commonly known as:  COSOPT  Use one drop in both eyes twice daily     FLUoxetine 20 MG tablet  Commonly known as:  PROZAC  TAKE ONE TABLET BY MOUTH ONCE DAILY     ICAPS Caps  Take 1 capsule by mouth 2 (two) times daily.     loratadine 10 MG tablet  Commonly known as:  CLARITIN  Take 10 mg by mouth daily as needed for allergies.     losartan 50 MG tablet  Commonly known as:  COZAAR  TAKE 1 TABLET EVERY DAY TO CONTROL BLOOD PRESSURE     polyethylene glycol packet  Commonly known as:  MIRALAX / GLYCOLAX  Take 17 g by mouth as needed.       Allergies  Allergen Reactions  . Penicillins  Follow-up Information    Follow up with GREEN, Lenon Curt, MD. Schedule an appointment as soon as possible for a visit in 2 weeks.   Specialty:  Internal Medicine   Contact information:   [address] [place] [address] [phone]        The results of significant diagnostics from this hospitalization (including imaging, microbiology, ancillary and laboratory) are listed below for reference.    Significant Diagnostic Studies: Dg Chest 2 View  08/31/2014   CLINICAL DATA:  Cough  EXAM: CHEST  2 VIEW  COMPARISON:  None.  FINDINGS: Cardiac shadow is at the upper limits of normal in size. Mild interstitial changes are noted without  acute infiltrate. No acute bony abnormality is noted.  IMPRESSION: Mild chronic interstitial changes without acute abnormality.   Electronically Signed   By: Alcide Clever M.D.   On: 08/31/2014 19:01   Ct Head Wo Contrast  08/31/2014   CLINICAL DATA:  Right leg weakness and slurred speech. New onset of dysphagia.  EXAM: CT HEAD WITHOUT CONTRAST  TECHNIQUE: Contiguous axial images were obtained from the base of the skull through the vertex without intravenous contrast.  COMPARISON:  None.  FINDINGS: Sinuses/Soft tissues: Mucosal thickening of the sphenoid sinuses. Clear mastoid air cells.  Intracranial: Expected cerebral atrophy. Moderate to marked low density in the periventricular white matter likely related to small vessel disease. Physiologic calcifications within the basal ganglia. Remote lacunar infarct in left caudate head. No mass lesion, hemorrhage, hydrocephalus, acute infarct, intra-axial, or extra-axial fluid collection.  Vertebral and carotid atherosclerosis.  IMPRESSION: 1.  No acute intracranial abnormality. 2.  Cerebral atrophy and small vessel ischemic change. 3. Sinus disease.   Electronically Signed   By: Jeronimo Greaves M.D.   On: 08/31/2014 14:46   Mr Maxine Glenn Head Wo Contrast  09/01/2014   CLINICAL DATA:  Right-sided weakness.  Dysphasia  EXAM: MRI HEAD WITHOUT CONTRAST  MRA HEAD WITHOUT CONTRAST  TECHNIQUE: Multiplanar, multiecho pulse sequences of the brain and surrounding structures were obtained without intravenous contrast. Angiographic images of the head were obtained using MRA technique without contrast.  COMPARISON:  Head CT 08/31/2014  FINDINGS: MRI HEAD FINDINGS  Diffusion imaging does not show any acute or subacute infarction. There are chronic small-vessel ischemic changes throughout the pons. There are old small vessel cerebellar infarctions. The cerebral hemispheres show chronic small-vessel ischemic changes throughout the thalami, basal ganglia and diffusely affecting the cerebral  hemispheric white matter. No large vessel territory infarction. No mass lesion, hemorrhage, hydrocephalus or extra-axial collection. No pituitary mass. There is mucosal inflammation affecting the sphenoid sinus which appears chronically opacified.  MRA HEAD FINDINGS  Both internal carotid arteries are patent through the skullbase. No siphon stenosis. The anterior and middle cerebral vessels are patent bilaterally but there are focal stenoses in the M1 and A1 segments on the right. The M1 stenosis is approximately 50% and would place the patient at risk of right MCA territory infarction.  Both vertebral arteries are widely patent to the basilar. No basilar stenosis. Posterior circulation branch vessels are patent, but there is narrowing and irregularity diffusely affecting the superior cerebellar and posterior cerebral arteries. PCA disease is more severe on the right than the left.  IMPRESSION: No acute infarction. Extensive chronic small vessel ischemic changes throughout the brain as outlined above.  No major vessel intracranial occlusion. Focal stenoses within the A1 and M1 segments on the right. 50% stenosis of the right M1 segment for place the patient at  risk of right MCA territory infarction. Diffuse atherosclerotic narrowing and irregularity within the major posterior circulation branch vessels.   Electronically Signed   By: Paulina FusiMark  Shogry M.D.   On: 09/01/2014 08:00   Mr Brain Wo Contrast  09/01/2014   CLINICAL DATA:  Right-sided weakness.  Dysphasia  EXAM: MRI HEAD WITHOUT CONTRAST  MRA HEAD WITHOUT CONTRAST  TECHNIQUE: Multiplanar, multiecho pulse sequences of the brain and surrounding structures were obtained without intravenous contrast. Angiographic images of the head were obtained using MRA technique without contrast.  COMPARISON:  Head CT 08/31/2014  FINDINGS: MRI HEAD FINDINGS  Diffusion imaging does not show any acute or subacute infarction. There are chronic small-vessel ischemic changes  throughout the pons. There are old small vessel cerebellar infarctions. The cerebral hemispheres show chronic small-vessel ischemic changes throughout the thalami, basal ganglia and diffusely affecting the cerebral hemispheric white matter. No large vessel territory infarction. No mass lesion, hemorrhage, hydrocephalus or extra-axial collection. No pituitary mass. There is mucosal inflammation affecting the sphenoid sinus which appears chronically opacified.  MRA HEAD FINDINGS  Both internal carotid arteries are patent through the skullbase. No siphon stenosis. The anterior and middle cerebral vessels are patent bilaterally but there are focal stenoses in the M1 and A1 segments on the right. The M1 stenosis is approximately 50% and would place the patient at risk of right MCA territory infarction.  Both vertebral arteries are widely patent to the basilar. No basilar stenosis. Posterior circulation branch vessels are patent, but there is narrowing and irregularity diffusely affecting the superior cerebellar and posterior cerebral arteries. PCA disease is more severe on the right than the left.  IMPRESSION: No acute infarction. Extensive chronic small vessel ischemic changes throughout the brain as outlined above.  No major vessel intracranial occlusion. Focal stenoses within the A1 and M1 segments on the right. 50% stenosis of the right M1 segment for place the patient at risk of right MCA territory infarction. Diffuse atherosclerotic narrowing and irregularity within the major posterior circulation branch vessels.   Electronically Signed   By: Paulina FusiMark  Shogry M.D.   On: 09/01/2014 08:00    Microbiology: No results found for this or any previous visit (from the past 240 hour(s)).   Labs: Basic Metabolic Panel:  Recent Labs Lab 08/31/14 1404 08/31/14 1426  NA 140 143  K 3.1* 3.0*  CL 105 107  CO2 22  --   GLUCOSE 100* 99  BUN 17 16  CREATININE 0.76 0.70  CALCIUM 9.2  --    Liver Function  Tests:  Recent Labs Lab 08/31/14 1404  AST 17  ALT 11  ALKPHOS 93  BILITOT 0.3  PROT 7.3  ALBUMIN 3.2*   No results for input(s): LIPASE, AMYLASE in the last 168 hours. No results for input(s): AMMONIA in the last 168 hours. CBC:  Recent Labs Lab 08/31/14 1404 08/31/14 1426  WBC 7.8  --   NEUTROABS 4.4  --   HGB 13.4 13.9  HCT 41.1 41.0  MCV 92.6  --   PLT 217  --    Cardiac Enzymes: No results for input(s): CKTOTAL, CKMB, CKMBINDEX, TROPONINI in the last 168 hours. BNP: BNP (last 3 results) No results for input(s): PROBNP in the last 8760 hours. CBG: No results for input(s): GLUCAP in the last 168 hours.     SignedChaya Jan:  HERNANDEZ ACOSTA,ESTELA  [facility] Pager: [phone] 09/02/2014, 11:36 AM

## 2014-09-02 NOTE — Progress Notes (Signed)
Clinical Social Work  Patient set to DC to SNF, [facility] today. Patient and daughter aware. DC packet given to RN, Florentina AddisonKatie. PTAR scheduled for 2:30pm pickup.   Clinical Social Work Department CLINICAL SOCIAL WORK PLACEMENT NOTE 09/02/2014  Patient:  Filler,Sieara  Account Number:  [id number] Admit date:  08/31/2014  Clinical Social Worker:  Orpah GreekKELLY FOLEY, LCSWA  Date/time:  09/01/2014 04:34 PM  Clinical Social Work is seeking post-discharge placement for this patient at the following level of care:   SKILLED NURSING   (*CSW will update this form in Epic as items are completed)   09/01/2014  Patient/family provided with [facility] Department of Clinical Social Work's list of facilities offering this level of care within the geographic area requested by the patient (or if unable, by the patient's family).  09/01/2014  Patient/family informed of their freedom to choose among providers that offer the needed level of care, that participate in Medicare, Medicaid or managed care program needed by the patient, have an available bed and are willing to accept the patient.  09/01/2014  Patient/family informed of [facility]' ownership interest in [facility], as well as of the fact that they are under no obligation to receive care at this facility.  PASARR submitted to EDS on 09/01/2014 PASARR number received on 09/01/2014  FL2 transmitted to all facilities in geographic area requested by pt/family on  09/01/2014 FL2 transmitted to all facilities within larger geographic area on   Patient informed that his/her managed care company has contracts with or will negotiate with  certain facilities, including the following:     Patient/family informed of bed offers received:  09/01/2014 [facility] Physician recommends and patient chooses bed at    [facility] on  09/02/2014 Patient to be transferred  to facility by PTAR Patient and family notified of transfer on 09/02/2014 Name of family member notified:  Raynelle FanningJulie via phone  The following physician request were entered in Epic:   Additional Comments:  Hampton AbbotKadijah Kiefer Opheim BSW Intern

## 2014-09-02 NOTE — Evaluation (Signed)
Speech Language Pathology Evaluation Patient Details Name: Henderson Baltimoremily Sobh MRN: [id number] DOB: 08-Jul-1920 Today's Date: 09/02/2014 Time: 1127-1208 SLP Time Calculation (min) (ACUTE ONLY): 41 min  Problem List:  Patient Active Problem List   Diagnosis Date Noted  . Bradycardia 09/01/2014  . TIA (transient ischemic attack) 08/31/2014  . Memory deficits 01/12/2014  . Unspecified constipation 01/12/2014  . Abnormality of gait 08/29/2013  . Sciatic pain 05/05/2013  . Hemorrhoids, external 05/05/2013  . Hypertension 08/10/2012  . Depression 08/10/2012  . Anxiety 08/10/2012  . Hypercholesteremia 08/10/2012  . Allergic rhinitis 08/10/2012   Past Medical History:  Past Medical History  Diagnosis Date  . Arthritis   . Depression   . Glaucoma     both eyes  . Hyperlipidemia   . Hypertension   . Cardiac arrhythmia due to congenital heart disease   . Sciatica   . Anxiety   . Allergic rhinitis   . Macular degeneration of both eyes    Past Surgical History:  Past Surgical History  Procedure Laterality Date  . Tonsillectomy and adenoidectomy  1926  . Appendectomy  1932  . Abdominal hysterectomy  1962  . Cataract extraction w/ intraocular lens implant Right 12/2011    Dr. Pricilla Holmucker  . Cataract extraction w/ intraocular lens implant Left 02/2013    Dr. Dione BoozeGroat  . Colonoscopy  2005   HPI:  80 [facility] speech and right leg weakness (dragging).  Pt's MRI was negative for acute event but showed old cerebellar infarcts, chronic ischemic changes.  Speech evaluation ordered.    Assessment / Plan / Recommendation Clinical Impression  Pt presents with intermittent delays in verbal output - which she states has been ongoing for the last 1 1/2 years.  She was able to follow multiprocess commands,  name 16 animals in 60 seconds and repeat multi-syllabic words.  Pt with higher level semantics stating that her daughter "provides outlets to help with captivity".  No focal CN  deficits and pt with good articulation abilities.    Provided pt with compenstion for word finding difficulties and strategies to improve semantics.  She states that her daughter manages her home bills and medications and note plan to dc to SNF.  No SLP follow up indicated at this time as pt has support indicated at dc.      SLP Assessment  Patient does not need any further Speech Lanaguage Pathology Services    Follow Up Recommendations  None    Frequency and Duration   n/a     Pertinent Vitals/Pain Pain Assessment: No/denies pain   SLP Goals  Patient/Family Stated Goal: to get rehab  SLP Evaluation Prior Functioning  Cognitive/Linguistic Baseline: Baseline deficits Baseline deficit details: pt reports decreased memory ability in the last few years Type of Home: Independent living facility [facility], reports daughter manages finances)  Lives With: Alone Available Help at Discharge: Family 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 baseline) Awareness: Appears intact (pt reports continued difficulties with expressive communication) Problem Solving: Appears intact (reports need to call for assist to use RR and expressed concern re: long antidepressant use) Safety/Judgment: Appears intact    Comprehension  Auditory Comprehension Overall Auditory Comprehension: Appears within functional limits for tasks assessed Yes/No Questions: Not tested Commands: Within Functional Limits (  for multiprocess with minimal delay) Conversation: Complex Visual Recognition/Discrimination Discrimination: Not tested Reading Comprehension Reading Status: Not tested    Expression Expression Primary Mode of Expression: Verbal Verbal  Expression Overall Verbal Expression: Other (comment) (uncertain if frequent pauses in expressive communication are normal for pt, she states this has worsened in the last few years) Initiation: No impairment Level of Generative/Spontaneous Verbalization: Conversation Repetition: No impairment Naming: Not tested Pragmatics: No impairment Written Expression Dominant Hand: Right Written Expression: Not tested   Oral / Motor Oral Motor/Sensory Function Overall Oral Motor/Sensory Function: Appears within functional limits for tasks assessed Motor Speech Overall Motor Speech: Appears within functional limits for tasks assessed Respiration: Within functional limits Resonance: Within functional limits Articulation: Within functional limitis Motor Planning: Witnin functional limits   GO Functional Assessment Tool Used: expressive communication clinical judgement Functional Limitations: Spoken language expressive Spoken Language Expression Current Status [phone](G9162): At least 1 percent but less than 20 percent impaired, limited or restricted Spoken Language Expression Goal Status [phone](G9163): At least 1 percent but less than 20 percent impaired, limited or restricted Spoken Language Expression Discharge Status [phone](G9164): At least 1 percent but less than 20 percent impaired, limited or restricted   Donavan Burnetamara Shauntae Reitman, MS [facility] SLP [phone]

## 2014-09-02 NOTE — Progress Notes (Signed)
Pt. Blood pressure 216/66 while lying in bed.  MD. Notified.  5 mg of hydralazine given.  Will continue to monitor pt.

## 2014-09-02 NOTE — Plan of Care (Signed)
Problem: Progression Outcomes Goal: Bowel & Bladder Continence Outcome: Completed/Met Date Met:  09/02/14

## 2014-09-02 NOTE — Progress Notes (Signed)
Called report to nurse at [facility].  All questions answered and made sure nurse knew what medications had been given today.  Blood pressure 186/56 upon dischage. MD made aware, MD did not want to treat further.  MD also aware of pt. Brady episodes.  Packet given to PTAR.  IV taken out, skin clean dry and intact.

## 2014-09-02 NOTE — Progress Notes (Signed)
Occupational Therapy Treatment Patient Details Name: Alice Swanson MRN: [id number] DOB: 1919/12/06 Today's Date: 09/02/2014    History of present illness 80 yo female admitted with TIA-slurred speech. Hx of HTN, glaucoma, sciatica, macular degeneration. [facility].    OT comments  Pt improved this visit with speech and balance  Follow Up Recommendations  SNF    Equipment Recommendations  None recommended by OT    Recommendations for Other Services      Precautions / Restrictions Precautions Precautions: Fall       Mobility Bed Mobility Overal bed mobility: Needs Assistance Bed Mobility: Supine to Sit     Supine to sit: Supervision        Transfers Overall transfer level: Needs assistance Equipment used: Rolling walker (2 wheeled) Transfers: Sit to/from Stand Sit to Stand: Supervision         General transfer comment: verbal cues for safety .          ADL Overall ADL's : Needs assistance/impaired     Grooming: Min guard;Standing           Upper Body Dressing : Set up;Sitting   Lower Body Dressing: Minimal assistance;Sit to/from stand   Toilet Transfer: RW;Min guard   Toileting- ArchitectClothing Manipulation and Hygiene: Min guard;Sit to/from stand       Functional mobility during ADLs: Minimal assistance General ADL Comments: pts plan is [facility]                 Cognition   Behavior During Therapy: [facility] for tasks assessed/performed Overall Cognitive Status: Impaired/Different from baseline Area of Impairment: Problem solving              Problem Solving: Difficulty sequencing;Requires tactile cues;Requires verbal cues;Slow processing              General Comments      Pertinent Vitals/ Pain       Pain Assessment: No/denies pain         Frequency Min 2X/week     Progress Toward Goals  OT Goals(current goals can now be found in the care plan section)  Progress towards OT goals:  Progressing toward goals     Plan Discharge plan remains appropriate       End of Session     Activity Tolerance Patient tolerated treatment well   Patient Left in chair;with chair alarm set   Nurse Communication Mobility status    Functional Assessment Tool Used: clinical observation Functional Limitation: Self care Self Care Current Status ([id number](G8987): At least 20 percent but less than 40 percent impaired, limited or restricted Self Care Goal Status ([id number](G8988): At least 1 percent but less than 20 percent impaired, limited or restricted   Time: [id number] OT Time Calculation (min): 40 min  Charges: OT G-codes **NOT FOR INPATIENT CLASS** Functional Assessment Tool Used: clinical observation Functional Limitation: Self care Self Care Current Status ([id number](G8987): At least 20 percent but less than 40 percent impaired, limited or restricted Self Care Goal Status (U1324(G8988): At least 1 percent but less than 20 percent impaired, limited or restricted OT General Charges $OT Visit: 1 Procedure OT Treatments $Self Care/Home Management : 38-52 mins  Cadarius Nevares, Metro KungLorraine D 09/02/2014, 10:17 AM

## 2014-09-03 ENCOUNTER — Encounter: Payer: Self-pay | Admitting: Nurse Practitioner

## 2014-09-03 ENCOUNTER — Non-Acute Institutional Stay (SKILLED_NURSING_FACILITY): Payer: Medicare Other | Admitting: Nurse Practitioner

## 2014-09-03 DIAGNOSIS — R413 Other amnesia: Secondary | ICD-10-CM

## 2014-09-03 DIAGNOSIS — F32A Depression, unspecified: Secondary | ICD-10-CM

## 2014-09-03 DIAGNOSIS — E876 Hypokalemia: Secondary | ICD-10-CM

## 2014-09-03 DIAGNOSIS — F329 Major depressive disorder, single episode, unspecified: Secondary | ICD-10-CM

## 2014-09-03 DIAGNOSIS — R001 Bradycardia, unspecified: Secondary | ICD-10-CM

## 2014-09-03 DIAGNOSIS — K5900 Constipation, unspecified: Secondary | ICD-10-CM

## 2014-09-03 DIAGNOSIS — I10 Essential (primary) hypertension: Secondary | ICD-10-CM

## 2014-09-03 DIAGNOSIS — J309 Allergic rhinitis, unspecified: Secondary | ICD-10-CM

## 2014-09-03 DIAGNOSIS — G459 Transient cerebral ischemic attack, unspecified: Secondary | ICD-10-CM

## 2014-09-03 NOTE — Progress Notes (Signed)
Patient ID: Alice Swanson, female   DOB: 10/25/1919, 80 y.o.   MRN: [id number]    Location of Service SNF [facility]  Allergies  Allergen Reactions  . Penicillins     Chief Complaint  Patient presents with  . Medical Management of Chronic Issues  . Acute Visit    elevated Bp  . Hospitalization Follow-up    HPI: Patient is a 80 y.o. female seen in the SNF at [facility] today for evaluation of elevated blood pressure, f/u hospital for TIA, and chronic medical conditions   Hospitalized 08/31/2014-09/02/2014 for TIA (transient ischemic attack) Hypertension Bradycardia. She presented to ED with a brief episode of slurred speech and generalized weakness, and more weakness in the right leg. Her symptoms resolved spontaneously within few min. CT head, MRI and MRA brain, echocardiogram, EKG, carotid US, CXR unremarkable. She is discharged to SNF with ASA-Neurology, Dr. Roseanne Reno   Problem List Items Addressed This Visit    TIA (transient ischemic attack) - Primary    -Symptoms resolved. -Still weak and PT/OT is recommending SNF.  -ECHO: Left ventricle: The cavity size was normal. Systolic function was normal. The estimated ejection fraction was in the range of 55% to 60%. Wall motion was normal; there were no regional wall motion abnormalities. Features are consistent with a pseudonormal left ventricular filling pattern, with concomitant abnormal relaxation and increased filling pressure (grade 2 diastolic dysfunction). -Carotid Dopplers: Bilateral - 1% to 39% ICA stenosis.  -Continue ASA for secondary CVA prevention.      Memory deficits    Admitted memory lapses: will obtain MMSE. Check TSH and B12.      Hypokalemia    K 3.0 08/31/14-repeat CMP     Hypertension    Elevated Bp195/89, 191/90, denied HA, change in vision, nausea, or chest pain or pressure. Will increase Losartan to 50mg  bid and monitor blood pressure.     Depression    Tearful, angry outburst,  very low tolerance in general, argumentative at visit 04/2013. She stated that she has lived too long and asked me I can prescribe medication to help her to end her life. Also she said it is the problem that her physical health is reasonable good. She admitted that she has lost weight and her appetite has been poor since she moved from [place] to [place] a year ago, especially since [place] has moved into [place] apartment @ [facility] from her Apartment @ [place] 2 months ago. The patient admitted that she is unreasonable. She declined increasing her Prozac for her depression management. Unremarkable CBC, and CMP 12/2013. During today's visit: Tearful and paranoia: said her 3 years old sister died recently. Her sister's son who is MD didn't do good job in taking care of her sister and may have given her something to assisted her dying.May be he can give me some pills to help me die.  She wants to decreased her Fluoxetine-I told her she may want to try different agent or adding Abilify to better managing her mood. Also told me that she has difficulty focusing, sleeping, and resting. She said sometimes she has strange dreams with visual hallucination. Hx of fail discontinuation of Fluoxetine.  09/03/14 takes fluoxetine 20mg , stated she sleeps and eats well. But she did question about her purpose of life and wonder how long will take her to expire.       Constipation    Daily prn MiraLax is adequate along with diet control.      Bradycardia  EKG with 1st degree block and marked sinus arrhythmia.        Allergic rhinitis    Managed with Loratadine prn.         Review of Systems:  Review of Systems  Constitutional: Positive for weight loss and diaphoresis (expressive perspiring easily). Negative for fever, chills and malaise/fatigue.  HENT: Positive for hearing loss. Negative for congestion, ear discharge, ear pain, nosebleeds, sore throat and tinnitus.   Eyes: Negative for blurred vision, double vision, photophobia,  pain, discharge and redness.  Respiratory: Negative for cough, hemoptysis, sputum production, shortness of breath, wheezing and stridor.   Cardiovascular: Negative for chest pain, palpitations, orthopnea, claudication, leg swelling and PND.  Gastrointestinal: Negative for heartburn, nausea, vomiting, abdominal pain, diarrhea, constipation, blood in stool and melena.  Genitourinary: Negative for dysuria, urgency, frequency, hematuria and flank pain.  Musculoskeletal: Positive for back pain (right sided sciatic pain). Negative for myalgias, joint pain, falls and neck pain.  Skin: Negative for itching and rash.  Neurological: Negative for dizziness, tingling, tremors, sensory change, speech change, focal weakness, seizures, loss of consciousness, weakness and headaches.  Endo/Heme/Allergies: Negative for environmental allergies and polydipsia. Does not bruise/bleed easily.  Psychiatric/Behavioral: Positive for depression. Negative for suicidal ideas, hallucinations, memory loss and substance abuse. The patient is nervous/anxious. The patient does not have insomnia.      Past Medical History  Diagnosis Date  . Arthritis   . Depression   . Glaucoma     both eyes  . Hyperlipidemia   . Hypertension   . Cardiac arrhythmia due to congenital heart disease   . Sciatica   . Anxiety   . Allergic rhinitis   . Macular degeneration of both eyes    Past Surgical History  Procedure Laterality Date  . Tonsillectomy and adenoidectomy  1926  . Appendectomy  1932  . Abdominal hysterectomy  1962  . Cataract extraction w/ intraocular lens implant Right 12/2011    Dr. Pricilla Holm  . Cataract extraction w/ intraocular lens implant Left 02/2013    Dr. Dione Booze  . Colonoscopy  2005   Social History:   reports that she has never smoked. She has never used smokeless tobacco. She reports that she does not drink alcohol or use illicit drugs.  Family History  Problem Relation Age of Onset  . Heart disease Mother      MI  . Stroke Father     Medications: Patient's Medications  New Prescriptions   No medications on file  Previous Medications   ASPIRIN 325 MG TABLET    Take 1 tablet (325 mg total) by mouth daily.   DORZOLAMIDE-TIMOLOL (COSOPT) 22.3-6.8 MG/ML OPHTHALMIC SOLUTION    Use one drop in both eyes twice daily   FLUOXETINE (PROZAC) 20 MG TABLET    TAKE ONE TABLET BY MOUTH ONCE DAILY   LORATADINE (CLARITIN) 10 MG TABLET    Take 10 mg by mouth daily as needed for allergies.   LOSARTAN (COZAAR) 50 MG TABLET    TAKE 1 TABLET EVERY DAY TO CONTROL BLOOD PRESSURE   MULTIPLE VITAMINS-MINERALS (ICAPS) CAPS    Take 1 capsule by mouth 2 (two) times daily.    POLYETHYLENE GLYCOL (MIRALAX / GLYCOLAX) PACKET    Take 17 g by mouth as needed.  Modified Medications   No medications on file  Discontinued Medications   No medications on file     Physical Exam: Physical Exam  Constitutional: She is oriented to person, place, and time. She appears well-developed and  well-nourished. No distress.  HENT:  Head: Normocephalic and atraumatic.  Right Ear: External ear normal.  Left Ear: External ear normal.  Nose: Nose normal.  Mouth/Throat: Oropharynx is clear and moist. No oropharyngeal exudate.  Eyes: Conjunctivae and EOM are normal. Pupils are equal, round, and reactive to light. Right eye exhibits no discharge. Left eye exhibits no discharge. No scleral icterus.  Neck: Normal range of motion. Neck supple. No JVD present. No tracheal deviation present. No thyromegaly present.  Cardiovascular: Normal rate, regular rhythm, normal heart sounds and intact distal pulses.   No murmur heard. Pulmonary/Chest: Effort normal and breath sounds normal. No stridor. No respiratory distress. She has no wheezes. She has no rales. She exhibits no tenderness.  Abdominal: Soft. Bowel sounds are normal. She exhibits no distension. There is no tenderness. There is no rebound and no guarding.  Genitourinary: Vagina normal. Guaiac  negative stool. No vaginal discharge found.  External hemorrhoids-stable.   Musculoskeletal: Normal range of motion. She exhibits no edema or tenderness.  Lymphadenopathy:    She has no cervical adenopathy.  Neurological: She is alert and oriented to person, place, and time. She displays normal reflexes. No cranial nerve deficit. She exhibits normal muscle tone. Coordination normal.  Skin: Skin is warm and dry. No rash noted. She is not diaphoretic. No erythema. No pallor.  Psychiatric: Her mood appears anxious. Her affect is angry, blunt, labile and inappropriate. Her speech is not rapid and/or pressured, not delayed, not tangential and not slurred. She is agitated, aggressive and hyperactive. She is not slowed, not withdrawn, not actively hallucinating and not combative. Thought content is not paranoid and not delusional. Cognition and memory are not impaired. She expresses impulsivity and inappropriate judgment. She exhibits a depressed mood. She expresses no homicidal and no suicidal ideation. She expresses no suicidal plans and no homicidal plans. She is communicative. She exhibits normal recent memory and normal remote memory. She is attentive.    Filed Vitals:   09/03/14 1417  BP: 195/89  Pulse: 64  Temp: 96.9 F (36.1 C)  TempSrc: Tympanic  Resp: 16      Labs reviewed: Basic Metabolic Panel:  Recent Labs  16/10/96 08/31/14 1404 08/31/14 1426  NA 140 140 143  K 3.6 3.1* 3.0*  CL  --  105 107  CO2  --  22  --   GLUCOSE  --  100* 99  BUN 18 17 16   CREATININE 0.8 0.76 0.70  CALCIUM  --  9.2  --    Liver Function Tests:  Recent Labs  12/26/13 08/31/14 1404  AST 14 17  ALT 11 11  ALKPHOS 84 93  BILITOT  --  0.3  PROT  --  7.3  ALBUMIN  --  3.2*   No results for input(s): LIPASE, AMYLASE in the last 8760 hours. No results for input(s): AMMONIA in the last 8760 hours. CBC:  Recent Labs  08/31/14 1404 08/31/14 1426  WBC 7.8  --   NEUTROABS 4.4  --   HGB 13.4  13.9  HCT 41.1 41.0  MCV 92.6  --   PLT 217  --    Lipid Panel:  Recent Labs  12/26/13 09/01/14 0445  CHOL 213* 198  HDL 56 46  LDLCALC 22 132*  TRIG 112 101  CHOLHDL  --  4.3   Anemia Panel: No results for input(s): FOLATE, IRON, VITAMINB12 in the last 8760 hours.  Past Procedures:  08/31/14 CT head:   IMPRESSION: 1. No acute intracranial  abnormality. 2. Cerebral atrophy and small vessel ischemic change. 3. Sinus disease.  08/31/14 CXR  IMPRESSION: Mild chronic interstitial changes without acute abnormality.  09/01/14 Carotid US:  Bilateral - 1% to 39% ICA stenosis.  09/01/14 echocardiogram:   -Left ventricle: The cavity size was normal. Systolic function was normal. The estimated ejection fraction was in the range of 55% to 60%. Wall motion was normal; there were no regional wall motion abnormalities. Features are consistent with a pseudonormal left ventricular filling pattern, with concomitant abnormal relaxation and increased filling pressure (grade 2 diastolic dysfunction). - Aortic valve: There was mild to moderate regurgitation. - Mitral valve: There was mild to moderate regurgitation directed centrally.  09/01/14 EKG:  Sinus bradycardia with 1st degree A-V block and PACs and blocked PACs Septal infarct , age undetermined Left ventricular hypertrophy  09/01/14 MRI and MRA:  IMPRESSION: No acute infarction. Extensive chronic small vessel ischemic changes throughout the brain as outlined above.   No major vessel intracranial occlusion. Focal stenoses within the A1 and M1 segments on the right. 50% stenosis of the right M1 segment for place the patient at risk of right MCA territory infarction. Diffuse atherosclerotic narrowing and irregularity within the major posterior circulation branch vessels.  Assessment/Plan TIA (transient ischemic attack) -Symptoms resolved. -Still weak and PT/OT is recommending SNF.  -ECHO: Left  ventricle: The cavity size was normal. Systolic function was normal. The estimated ejection fraction was in the range of 55% to 60%. Wall motion was normal; there were no regional wall motion abnormalities. Features are consistent with a pseudonormal left ventricular filling pattern, with concomitant abnormal relaxation and increased filling pressure (grade 2 diastolic dysfunction). -Carotid Dopplers: Bilateral - 1% to 39% ICA stenosis.  -Continue ASA for secondary CVA prevention.    Bradycardia EKG with 1st degree block and marked sinus arrhythmia.      Hypertension Elevated Bp195/89, 191/90, denied HA, change in vision, nausea, or chest pain or pressure. Will increase Losartan to 50mg  bid and monitor blood pressure.   Constipation Daily prn MiraLax is adequate along with diet control.    Memory deficits Admitted memory lapses: will obtain MMSE. Check TSH and B12.    Depression Tearful, angry outburst, very low tolerance in general, argumentative at visit 04/2013. She stated that she has lived too long and asked me I can prescribe medication to help her to end her life. Also she said it is the problem that her physical health is reasonable good. She admitted that she has lost weight and her appetite has been poor since she moved from [place] to [place] a year ago, especially since [place] has [place] into [place] apartment @ [facility] from her Apartment @ [place] 2 months ago. The patient admitted that she is unreasonable. She declined increasing her Prozac for her depression management. Unremarkable CBC, and CMP 12/2013. During today's visit: Tearful and paranoia: said her 78 years old sister died recently. Her sister's son who is MD didn't do good job in taking care of her sister and may have given her something to assisted her dying.May be he can give me some pills to help me die.  She wants to decreased her Fluoxetine-I told her she may want to try different agent or adding Abilify to better  managing her mood. Also told me that she has difficulty focusing, sleeping, and resting. She said sometimes she has strange dreams with visual hallucination. Hx of fail discontinuation of Fluoxetine.  09/03/14 takes fluoxetine 20mg , stated she sleeps and eats well. But  she did question about her purpose of life and wonder how long will take her to expire.     Allergic rhinitis Managed with Loratadine prn.    Hypokalemia K 3.0 08/31/14-repeat CMP     Family/ Staff Communication: observe the patient  Goals of Care: [place]  Labs/tests ordered: TSH, Vit B12, MMSE, CMP

## 2014-09-03 NOTE — Assessment & Plan Note (Addendum)
Tearful, angry outburst, very low tolerance in general, argumentative at visit 04/2013. She stated that she has lived too long and asked me I can prescribe medication to help her to end her life. Also she said it is the problem that her physical health is reasonable good. She admitted that she has lost weight and her appetite has been poor since she moved from [place] to [place] a year ago, especially since [place] has [place] into [place] apartment @ [facility] from her Apartment @ [place] 2 months ago. The patient admitted that she is unreasonable. She declined increasing her Prozac for her depression management. Unremarkable CBC, and CMP 12/2013. During today's visit: Tearful and paranoia: said her 78 years old sister died recently. Her sister's son who is MD didn't do good job in taking care of her sister and may have given her something to assisted her dying.May be he can give me some pills to help me die.  She wants to decreased her Fluoxetine-I told her she may want to try different agent or adding Abilify to better managing her mood. Also told me that she has difficulty focusing, sleeping, and resting. She said sometimes she has strange dreams with visual hallucination. Hx of fail discontinuation of Fluoxetine.  09/03/14 takes fluoxetine 20mg , stated she sleeps and eats well. But she did question about her purpose of life and wonder how long will take her to expire.

## 2014-09-03 NOTE — Assessment & Plan Note (Signed)
EKG with 1st degree block and marked sinus arrhythmia.

## 2014-09-03 NOTE — Assessment & Plan Note (Signed)
Elevated Bp195/89, 191/90, denied HA, change in vision, nausea, or chest pain or pressure. Will increase Losartan to 50mg  bid and monitor blood pressure.

## 2014-09-03 NOTE — Assessment & Plan Note (Signed)
Managed with Loratadine prn.

## 2014-09-03 NOTE — Assessment & Plan Note (Signed)
-  Symptoms resolved. -Still weak and PT/OT is recommending SNF.  -ECHO: Left ventricle: The cavity size was normal. Systolic function was normal. The estimated ejection fraction was in the range of 55% to 60%. Wall motion was normal; there were no regional wall motion abnormalities. Features are consistent with a pseudonormal left ventricular filling pattern, with concomitant abnormal relaxation and increased filling pressure (grade 2 diastolic dysfunction). -Carotid Dopplers: Bilateral - 1% to 39% ICA stenosis.  -Continue ASA for secondary CVA prevention.

## 2014-09-03 NOTE — Assessment & Plan Note (Signed)
Daily prn MiraLax is adequate along with diet control.

## 2014-09-03 NOTE — Assessment & Plan Note (Signed)
K 3.0 08/31/14-repeat CMP

## 2014-09-03 NOTE — Assessment & Plan Note (Signed)
Admitted memory lapses: will obtain MMSE. Check TSH and B12.

## 2014-09-04 ENCOUNTER — Encounter: Payer: Self-pay | Admitting: Internal Medicine

## 2014-09-04 ENCOUNTER — Non-Acute Institutional Stay (SKILLED_NURSING_FACILITY): Payer: Medicare Other | Admitting: Internal Medicine

## 2014-09-04 DIAGNOSIS — F419 Anxiety disorder, unspecified: Secondary | ICD-10-CM

## 2014-09-04 DIAGNOSIS — F329 Major depressive disorder, single episode, unspecified: Secondary | ICD-10-CM

## 2014-09-04 DIAGNOSIS — G459 Transient cerebral ischemic attack, unspecified: Secondary | ICD-10-CM

## 2014-09-04 DIAGNOSIS — F32A Depression, unspecified: Secondary | ICD-10-CM

## 2014-09-04 DIAGNOSIS — G319 Degenerative disease of nervous system, unspecified: Secondary | ICD-10-CM | POA: Insufficient documentation

## 2014-09-04 DIAGNOSIS — R269 Unspecified abnormalities of gait and mobility: Secondary | ICD-10-CM

## 2014-09-04 DIAGNOSIS — E876 Hypokalemia: Secondary | ICD-10-CM

## 2014-09-04 DIAGNOSIS — R413 Other amnesia: Secondary | ICD-10-CM

## 2014-09-04 DIAGNOSIS — I679 Cerebrovascular disease, unspecified: Secondary | ICD-10-CM

## 2014-09-04 LAB — HEPATIC FUNCTION PANEL
ALT: 13 U/L (ref 7–35)
AST: 19 U/L (ref 13–35)
Alkaline Phosphatase: 82 U/L (ref 25–125)
Bilirubin, Total: 0.5 mg/dL

## 2014-09-04 LAB — BASIC METABOLIC PANEL
BUN: 19 mg/dL (ref 4–21)
Glucose: 1 mg/dL
POTASSIUM: 4 mmol/L (ref 3.4–5.3)
SODIUM: 142 mmol/L (ref 137–147)

## 2014-09-04 LAB — TSH: TSH: 1.94 u[IU]/mL (ref 0.41–5.90)

## 2014-09-04 NOTE — Progress Notes (Signed)
Patient ID: Alice Swanson, female   DOB: September 24, 1920, 80 y.o.   MRN: [id number]    HISTORY AND PHYSICAL  Location:  [place] Room Number: 1 Place of Service: SNF (31)   Extended Emergency Contact Information Primary Emergency Contact: [place] Phone: [phone] Mobile Phone: [phone] Relation: Daughter   Chief Complaint  Patient presents with  . New Admit To SNF    following hospitalization    HPI:  Hospitalized from 08/31/2014 through 09/02/2014. Transient cerebral ischemia, unspecified transient cerebral ischemia type: presented to the hospital with slurred speech and weakness on the right side. This cleared within a couple of hours. Subsequent workup included normal carotid Doppler and 2-D echocardiogram.  Memory deficits: Likely secondary to cerebrovascular disease which was documented on MRI scans of the brain.  Depression: Long-standing problem. She takes Prozac. She is having difficulty finding any meaning to her life.  Cerebrovascular disease: Significant small vessel disease noted as well as old cerebellar infarcts.  Hypokalemia: Potassium was low at 3.0 mEq per liter while hospitalized. She will need follow-up.   Anxiety: Improved on current medication.  Abnormality of gait: Patient will be enrolled in physical therapy for strengthening and gait training.    Past Medical History  Diagnosis Date  . Arthritis   . Depression   . Glaucoma     both eyes  . Hyperlipidemia   . Hypertension   . Cardiac arrhythmia due to congenital heart disease   . Sciatica   . Anxiety   . Allergic rhinitis   . Macular degeneration of both eyes   . TIA (transient ischemic attack) 08/31/14  . Bradycardia 08/31/14  . Heart block AV first degree 08/31/14  . Memory deficit 08/31/14  . Cerebral atrophy 08/31/14  . Cerebrovascular disease 08/31/14  . Cerebellar infarction 08/31/14  . Osteoarthritis 08/31/14     Past Surgical History  Procedure Laterality Date  . Tonsillectomy and adenoidectomy  1926  . Appendectomy  1932  . Abdominal hysterectomy  1962  . Cataract extraction w/ intraocular lens implant Right 12/2011    Dr. Berline Lopes  . Cataract extraction w/ intraocular lens implant Left 02/2013    Dr. Katy Fitch  . Colonoscopy  2005    Patient Care Team: Estill Dooms, MD as PCP - General (Internal Medicine) [facility] [place] Clent Jacks, MD as Consulting Physician (Ophthalmology) Man Mast X, NP as Nurse Practitioner (Nurse Practitioner)  History   Social History  . Marital Status: Divorced    Spouse Name: N/A    Number of Children: N/A  . Years of Education: N/A   Occupational History  . Retired Pharmacist, hospital    Social History Main Topics  . Smoking status: Never Smoker   . Smokeless tobacco: Never Used  . Alcohol Use: No  . Drug Use: No  . Sexual Activity: No   Other Topics Concern  . Not on file   Social History Narrative   Lives at [facility] since 02/2013   Divorced   Never smoked   Exercise: none   Walks with walker                     reports that she has never smoked. She has never used smokeless tobacco. She reports that she does not drink alcohol or use illicit drugs.  Family History  Problem Relation Age of Onset  . Heart disease Mother     MI  . Stroke  Father    Family Status  Relation Status Death Age  . Mother Deceased 57  . Father Deceased 36  . Sister Alive   . Daughter Alive   . Daughter Alive     Immunization History  Administered Date(s) Administered  . Influenza Whole 07/17/2012, 07/31/2013  . PPD Test 01/04/2013  . Pneumococcal Polysaccharide-23 01/04/2013  . Tetanus 01/04/2013    Allergies  Allergen Reactions  . Penicillins     Medications: Patient's Medications  New Prescriptions   No medications on file  Previous Medications   ASPIRIN 325 MG TABLET    Take 1 tablet (325 mg total) by mouth daily.    DORZOLAMIDE-TIMOLOL (COSOPT) 22.3-6.8 MG/ML OPHTHALMIC SOLUTION    Use one drop in both eyes twice daily   FLUOXETINE (PROZAC) 20 MG TABLET    TAKE ONE TABLET BY MOUTH ONCE DAILY   LORATADINE (CLARITIN) 10 MG TABLET    Take 10 mg by mouth daily as needed for allergies.   LOSARTAN (COZAAR) 50 MG TABLET    TAKE 1 TABLET EVERY DAY TO CONTROL BLOOD PRESSURE   MULTIPLE VITAMINS-MINERALS (ICAPS) CAPS    Take 1 capsule by mouth 2 (two) times daily.    POLYETHYLENE GLYCOL (MIRALAX / GLYCOLAX) PACKET    Take 17 g by mouth as needed.  Modified Medications   No medications on file  Discontinued Medications   No medications on file    Review of Systems  Constitutional: Negative.   HENT: Positive for hearing loss. Negative for congestion and ear pain.   Eyes: Positive for visual disturbance.       History of glaucoma and macular degeneration. She sees Dr. Katy Fitch.  Respiratory: Negative.   Cardiovascular: Negative for chest pain, palpitations and leg swelling.  Gastrointestinal: Negative.   Endocrine: Negative.   Genitourinary: Negative.   Musculoskeletal: Positive for back pain. Negative for myalgias, joint swelling, gait problem, neck pain and neck stiffness.       History of sciatica with involvement of the right leg.  Allergic/Immunologic: Negative.   Neurological:       She was previously evaluated in [place] for episodes of dizziness. She does not recall diagnosis. She is unsure what tests were done, but thinks they included cardiac tests and brain scan. She continues to have dizzy episodes, but they're not as severe as in the past. She has never had syncope or seizure.  Hematological: Negative.   Psychiatric/Behavioral:       Severe depression which is improving.    Filed Vitals:   09/04/14 1229  BP: 148/79  Pulse: 82  Temp: 97.5 F (36.4 C)  Resp: 20  Height: 4' 11"  (1.499 m)  Weight: 131 lb (59.421 kg)   Body mass index is 26.44 kg/(m^2).  Physical Exam  Constitutional: She is  oriented to person, place, and time. She appears well-developed and well-nourished. No distress.  HENT:  Head: Normocephalic and atraumatic.  Right Ear: External ear normal.  Left Ear: External ear normal.  Nose: Nose normal.  Mouth/Throat: Oropharynx is clear and moist. No oropharyngeal exudate.  Eyes: Conjunctivae and EOM are normal. Pupils are equal, round, and reactive to light. Right eye exhibits no discharge. Left eye exhibits no discharge. No scleral icterus.  Neck: Normal range of motion. Neck supple. No JVD present. No tracheal deviation present. No thyromegaly present.  Cardiovascular: Normal rate, regular rhythm, normal heart sounds and intact distal pulses.   No murmur heard. Pulmonary/Chest: Effort normal and breath sounds normal. No stridor.  No respiratory distress. She has no wheezes. She has no rales. She exhibits no tenderness.  Abdominal: Soft. Bowel sounds are normal. She exhibits no distension. There is no tenderness. There is no rebound and no guarding.  Genitourinary: Vagina normal. Guaiac negative stool. No vaginal discharge found.  External hemorrhoids-stable.   Musculoskeletal: Normal range of motion. She exhibits no edema or tenderness.  Lymphadenopathy:    She has no cervical adenopathy.  Neurological: She is alert and oriented to person, place, and time. She displays normal reflexes. No cranial nerve deficit. She exhibits normal muscle tone. Coordination normal.  01/13/14 MMSE. Passed clock drawing.  Skin: Skin is warm and dry. No rash noted. She is not diaphoretic. No erythema. No pallor.  Large bruise right forearm from use of IV infusions.  Psychiatric: Her mood appears anxious. Her affect is blunt. Her affect is not angry, not labile and not inappropriate. Her speech is not rapid and/or pressured, not delayed, not tangential and not slurred. She is not agitated, not aggressive, not hyperactive, not slowed, not withdrawn, not actively hallucinating and not  combative. Thought content is not paranoid and not delusional. Cognition and memory are impaired (Mildly). She expresses impulsivity and inappropriate judgment. She exhibits a depressed mood. She expresses no homicidal and no suicidal ideation. She expresses no suicidal plans and no homicidal plans. She is communicative. She exhibits abnormal recent memory. She exhibits normal remote memory. She is attentive.     Labs reviewed: Nursing Home on 09/04/2014  Component Date Value Ref Range Status  . Glucose 09/04/2014 1   Final  . BUN 09/04/2014 19  4 - 21 mg/dL Final  . Potassium 09/04/2014 4.0  3.4 - 5.3 mmol/L Final  . Sodium 09/04/2014 142  137 - 147 mmol/L Final  . Alkaline Phosphatase 09/04/2014 82  25 - 125 U/L Final  . ALT 09/04/2014 13  7 - 35 U/L Final  . AST 09/04/2014 19  13 - 35 U/L Final  . Bilirubin, Total 09/04/2014 0.5   Final  . TSH 09/04/2014 1.94  0.41 - 5.90 uIU/mL Final  Admission on 08/31/2014, Discharged on 09/02/2014  Component Date Value Ref Range Status  . Alcohol, Ethyl (B) 08/31/2014 <11  0 - 11 mg/dL Final   Comment:        LOWEST DETECTABLE LIMIT FOR SERUM ALCOHOL IS 11 mg/dL FOR MEDICAL PURPOSES ONLY   . Sodium 08/31/2014 143  137 - 147 mEq/L Final  . Potassium 08/31/2014 3.0* 3.7 - 5.3 mEq/L Final  . Chloride 08/31/2014 107  96 - 112 mEq/L Final  . BUN 08/31/2014 16  6 - 23 mg/dL Final  . Creatinine, Ser 08/31/2014 0.70  0.50 - 1.10 mg/dL Final  . Glucose, Bld 08/31/2014 99  70 - 99 mg/dL Final  . Calcium, Ion 08/31/2014 1.11* 1.13 - 1.30 mmol/L Final  . TCO2 08/31/2014 21  0 - 100 mmol/L Final  . Hemoglobin 08/31/2014 13.9  12.0 - 15.0 g/dL Final  . HCT 08/31/2014 41.0  36.0 - 46.0 % Final  . Troponin i, poc 08/31/2014 0.01  0.00 - 0.08 ng/mL Final  . Comment 3 08/31/2014          Final   Comment: Due to the release kinetics of cTnI, a negative result within the first hours of the onset of symptoms does not rule out myocardial infarction with  certainty. If myocardial infarction is still suspected, repeat the test at appropriate intervals.   . Prothrombin Time 08/31/2014 13.7  11.6 -  15.2 seconds Final  . INR 08/31/2014 1.04  0.00 - 1.49 Final  . aPTT 08/31/2014 29  24 - 37 seconds Final  . WBC 08/31/2014 7.8  4.0 - 10.5 K/uL Final  . RBC 08/31/2014 4.44  3.87 - 5.11 MIL/uL Final  . Hemoglobin 08/31/2014 13.4  12.0 - 15.0 g/dL Final  . HCT 08/31/2014 41.1  36.0 - 46.0 % Final  . MCV 08/31/2014 92.6  78.0 - 100.0 fL Final  . MCH 08/31/2014 30.2  26.0 - 34.0 pg Final  . MCHC 08/31/2014 32.6  30.0 - 36.0 g/dL Final  . RDW 08/31/2014 13.3  11.5 - 15.5 % Final  . Platelets 08/31/2014 217  150 - 400 K/uL Final  . Neutrophils Relative % 08/31/2014 56  43 - 77 % Final  . Neutro Abs 08/31/2014 4.4  1.7 - 7.7 K/uL Final  . Lymphocytes Relative 08/31/2014 34  12 - 46 % Final  . Lymphs Abs 08/31/2014 2.6  0.7 - 4.0 K/uL Final  . Monocytes Relative 08/31/2014 7  3 - 12 % Final  . Monocytes Absolute 08/31/2014 0.6  0.1 - 1.0 K/uL Final  . Eosinophils Relative 08/31/2014 3  0 - 5 % Final  . Eosinophils Absolute 08/31/2014 0.2  0.0 - 0.7 K/uL Final  . Basophils Relative 08/31/2014 0  0 - 1 % Final  . Basophils Absolute 08/31/2014 0.0  0.0 - 0.1 K/uL Final  . Sodium 08/31/2014 140  137 - 147 mEq/L Final  . Potassium 08/31/2014 3.1* 3.7 - 5.3 mEq/L Final  . Chloride 08/31/2014 105  96 - 112 mEq/L Final  . CO2 08/31/2014 22  19 - 32 mEq/L Final  . Glucose, Bld 08/31/2014 100* 70 - 99 mg/dL Final  . BUN 08/31/2014 17  6 - 23 mg/dL Final  . Creatinine, Ser 08/31/2014 0.76  0.50 - 1.10 mg/dL Final  . Calcium 08/31/2014 9.2  8.4 - 10.5 mg/dL Final  . Total Protein 08/31/2014 7.3  6.0 - 8.3 g/dL Final  . Albumin 08/31/2014 3.2* 3.5 - 5.2 g/dL Final  . AST 08/31/2014 17  0 - 37 U/L Final  . ALT 08/31/2014 11  0 - 35 U/L Final  . Alkaline Phosphatase 08/31/2014 93  39 - 117 U/L Final  . Total Bilirubin 08/31/2014 0.3  0.3 - 1.2 mg/dL Final    . GFR calc non Af Amer 08/31/2014 71* >90 mL/min Final  . GFR calc Af Amer 08/31/2014 82* >90 mL/min Final   Comment: (NOTE) The eGFR has been calculated using the CKD EPI equation. This calculation has not been validated in all clinical situations. eGFR's persistently <90 mL/min signify possible Chronic Kidney Disease.   . Anion gap 08/31/2014 13  5 - 15 Final  . Opiates 08/31/2014 NONE DETECTED  NONE DETECTED Final  . Cocaine 08/31/2014 NONE DETECTED  NONE DETECTED Final  . Benzodiazepines 08/31/2014 NONE DETECTED  NONE DETECTED Final  . Amphetamines 08/31/2014 NONE DETECTED  NONE DETECTED Final  . Tetrahydrocannabinol 08/31/2014 NONE DETECTED  NONE DETECTED Final  . Barbiturates 08/31/2014 NONE DETECTED  NONE DETECTED Final   Comment:        DRUG SCREEN FOR MEDICAL PURPOSES ONLY.  IF CONFIRMATION IS NEEDED FOR ANY PURPOSE, NOTIFY LAB WITHIN 5 DAYS.        LOWEST DETECTABLE LIMITS FOR URINE DRUG SCREEN Drug Class       Cutoff (ng/mL) Amphetamine      1000 Barbiturate      200 Benzodiazepine  176 Tricyclics       160 Opiates          300 Cocaine          300 THC              50   . Color, Urine 08/31/2014 YELLOW  YELLOW Final  . APPearance 08/31/2014 CLEAR  CLEAR Final  . Specific Gravity, Urine 08/31/2014 1.006  1.005 - 1.030 Final  . pH 08/31/2014 7.0  5.0 - 8.0 Final  . Glucose, UA 08/31/2014 NEGATIVE  NEGATIVE mg/dL Final  . Hgb urine dipstick 08/31/2014 SMALL* NEGATIVE Final  . Bilirubin Urine 08/31/2014 NEGATIVE  NEGATIVE Final  . Ketones, ur 08/31/2014 NEGATIVE  NEGATIVE mg/dL Final  . Protein, ur 08/31/2014 NEGATIVE  NEGATIVE mg/dL Final  . Urobilinogen, UA 08/31/2014 0.2  0.0 - 1.0 mg/dL Final  . Nitrite 08/31/2014 NEGATIVE  NEGATIVE Final  . Leukocytes, UA 08/31/2014 NEGATIVE  NEGATIVE Final  . Squamous Epithelial / LPF 08/31/2014 RARE  RARE Final  . WBC, UA 08/31/2014 0-2  <3 WBC/hpf Final  . RBC / HPF 08/31/2014 0-2  <3 RBC/hpf Final  . Hgb A1c MFr Bld  09/01/2014 5.9* <5.7 % Final   Comment: (NOTE)                                                                       According to the ADA Clinical Practice Recommendations for 2011, when HbA1c is used as a screening test:  >=6.5%   Diagnostic of Diabetes Mellitus           (if abnormal result is confirmed) 5.7-6.4%   Increased risk of developing Diabetes Mellitus References:Diagnosis and Classification of Diabetes Mellitus,Diabetes VPXT,0626,94(WNIOE 1):S62-S69 and Standards of Medical Care in         Diabetes - 2011,Diabetes Care,2011,34 (Suppl 1):S11-S61.   . Mean Plasma Glucose 09/01/2014 123* <117 mg/dL Final   Performed at [facility]  . Cholesterol 09/01/2014 198  0 - 200 mg/dL Final  . Triglycerides 09/01/2014 101  <150 mg/dL Final  . HDL 09/01/2014 46  >39 mg/dL Final  . Total CHOL/HDL Ratio 09/01/2014 4.3   Final  . VLDL 09/01/2014 20  0 - 40 mg/dL Final  . LDL Cholesterol 09/01/2014 132* 0 - 99 mg/dL Final   Comment:        Total Cholesterol/HDL:CHD Risk Coronary Heart Disease Risk Table                     Men   Women  1/2 Average Risk   3.4   3.3  Average Risk       5.0   4.4  2 X Average Risk   9.6   7.1  3 X Average Risk  23.4   11.0        Use the calculated Patient Ratio above and the CHD Risk Table to determine the patient's CHD Risk.        ATP III CLASSIFICATION (LDL):  <100     mg/dL   Optimal  100-129  mg/dL   Near or Above                    Optimal  130-159  mg/dL   Borderline  160-189  mg/dL   High  >190     mg/dL   Very High Performed at [facility]     Dg Chest 2 View  08/31/2014   CLINICAL DATA:  Cough  EXAM: CHEST  2 VIEW  COMPARISON:  None.  FINDINGS: Cardiac shadow is at the upper limits of normal in size. Mild interstitial changes are noted without acute infiltrate. No acute bony abnormality is noted.  IMPRESSION: Mild chronic interstitial changes without acute abnormality.   Electronically Signed   By: Inez Catalina M.D.    On: 08/31/2014 19:01   Ct Head Wo Contrast  08/31/2014   CLINICAL DATA:  Right leg weakness and slurred speech. New onset of dysphagia.  EXAM: CT HEAD WITHOUT CONTRAST  TECHNIQUE: Contiguous axial images were obtained from the base of the skull through the vertex without intravenous contrast.  COMPARISON:  None.  FINDINGS: Sinuses/Soft tissues: Mucosal thickening of the sphenoid sinuses. Clear mastoid air cells.  Intracranial: Expected cerebral atrophy. Moderate to marked low density in the periventricular white matter likely related to small vessel disease. Physiologic calcifications within the basal ganglia. Remote lacunar infarct in left caudate head. No mass lesion, hemorrhage, hydrocephalus, acute infarct, intra-axial, or extra-axial fluid collection.  Vertebral and carotid atherosclerosis.  IMPRESSION: 1.  No acute intracranial abnormality. 2.  Cerebral atrophy and small vessel ischemic change. 3. Sinus disease.   Electronically Signed   By: Abigail Miyamoto M.D.   On: 08/31/2014 14:46   Mr Jodene Nam Head Wo Contrast  09/01/2014   CLINICAL DATA:  Right-sided weakness.  Dysphasia  EXAM: MRI HEAD WITHOUT CONTRAST  MRA HEAD WITHOUT CONTRAST  TECHNIQUE: Multiplanar, multiecho pulse sequences of the brain and surrounding structures were obtained without intravenous contrast. Angiographic images of the head were obtained using MRA technique without contrast.  COMPARISON:  Head CT 08/31/2014  FINDINGS: MRI HEAD FINDINGS  Diffusion imaging does not show any acute or subacute infarction. There are chronic small-vessel ischemic changes throughout the pons. There are old small vessel cerebellar infarctions. The cerebral hemispheres show chronic small-vessel ischemic changes throughout the thalami, basal ganglia and diffusely affecting the cerebral hemispheric white matter. No large vessel territory infarction. No mass lesion, hemorrhage, hydrocephalus or extra-axial collection. No pituitary mass. There is mucosal  inflammation affecting the sphenoid sinus which appears chronically opacified.  MRA HEAD FINDINGS  Both internal carotid arteries are patent through the skullbase. No siphon stenosis. The anterior and middle cerebral vessels are patent bilaterally but there are focal stenoses in the M1 and A1 segments on the right. The M1 stenosis is approximately 50% and would place the patient at risk of right MCA territory infarction.  Both vertebral arteries are widely patent to the basilar. No basilar stenosis. Posterior circulation branch vessels are patent, but there is narrowing and irregularity diffusely affecting the superior cerebellar and posterior cerebral arteries. PCA disease is more severe on the right than the left.  IMPRESSION: No acute infarction. Extensive chronic small vessel ischemic changes throughout the brain as outlined above.  No major vessel intracranial occlusion. Focal stenoses within the A1 and M1 segments on the right. 50% stenosis of the right M1 segment for place the patient at risk of right MCA territory infarction. Diffuse atherosclerotic narrowing and irregularity within the major posterior circulation branch vessels.   Electronically Signed   By: Nelson Chimes M.D.   On: 09/01/2014 08:00   Mr Brain Wo Contrast  09/01/2014   CLINICAL DATA:  Right-sided weakness.  Dysphasia  EXAM: MRI HEAD WITHOUT CONTRAST  MRA HEAD WITHOUT CONTRAST  TECHNIQUE: Multiplanar, multiecho pulse sequences of the brain and surrounding structures were obtained without intravenous contrast. Angiographic images of the head were obtained using MRA technique without contrast.  COMPARISON:  Head CT 08/31/2014  FINDINGS: MRI HEAD FINDINGS  Diffusion imaging does not show any acute or subacute infarction. There are chronic small-vessel ischemic changes throughout the pons. There are old small vessel cerebellar infarctions. The cerebral hemispheres show chronic small-vessel ischemic changes throughout the thalami, basal  ganglia and diffusely affecting the cerebral hemispheric white matter. No large vessel territory infarction. No mass lesion, hemorrhage, hydrocephalus or extra-axial collection. No pituitary mass. There is mucosal inflammation affecting the sphenoid sinus which appears chronically opacified.  MRA HEAD FINDINGS  Both internal carotid arteries are patent through the skullbase. No siphon stenosis. The anterior and middle cerebral vessels are patent bilaterally but there are focal stenoses in the M1 and A1 segments on the right. The M1 stenosis is approximately 50% and would place the patient at risk of right MCA territory infarction.  Both vertebral arteries are widely patent to the basilar. No basilar stenosis. Posterior circulation branch vessels are patent, but there is narrowing and irregularity diffusely affecting the superior cerebellar and posterior cerebral arteries. PCA disease is more severe on the right than the left.  IMPRESSION: No acute infarction. Extensive chronic small vessel ischemic changes throughout the brain as outlined above.  No major vessel intracranial occlusion. Focal stenoses within the A1 and M1 segments on the right. 50% stenosis of the right M1 segment for place the patient at risk of right MCA territory infarction. Diffuse atherosclerotic narrowing and irregularity within the major posterior circulation branch vessels.   Electronically Signed   By: Nelson Chimes M.D.   On: 09/01/2014 08:00   09/04/2014 B12 level: 366  Assessment/Plan  1. Transient cerebral ischemia, unspecified transient cerebral ischemia type Currently on aspirin 325 mg daily.  2. Memory deficits Mild short-term memory problems  3. Depression Continue on Prozac  4. Cerebrovascular disease Likely contributes to a short-term memory issue  5. Hypokalemia Resolved. Potassium now 4.0  6. Anxiety Controlled  7. Abnormality of gait Engaged in physical therapy for strengthening and gait  training.   Patient examined as an out for strengthening and gait training for safe mobility. This is anticipated to be a short-term rehabilitative stay. She may be doing well enough to be discharged to her apartment next week.

## 2014-09-08 ENCOUNTER — Encounter: Payer: Self-pay | Admitting: Nurse Practitioner

## 2014-09-08 DIAGNOSIS — R413 Other amnesia: Secondary | ICD-10-CM

## 2014-09-08 NOTE — Progress Notes (Signed)
This encounter was created in error - please disregard.

## 2014-09-18 ENCOUNTER — Encounter: Payer: Medicare Other | Admitting: Nurse Practitioner

## 2014-10-02 ENCOUNTER — Encounter: Payer: Self-pay | Admitting: Nurse Practitioner

## 2014-10-06 ENCOUNTER — Ambulatory Visit (INDEPENDENT_AMBULATORY_CARE_PROVIDER_SITE_OTHER): Payer: Medicare Other | Admitting: Ophthalmology

## 2014-10-06 DIAGNOSIS — I10 Essential (primary) hypertension: Secondary | ICD-10-CM

## 2014-10-06 DIAGNOSIS — H3531 Nonexudative age-related macular degeneration: Secondary | ICD-10-CM

## 2014-10-06 DIAGNOSIS — H35033 Hypertensive retinopathy, bilateral: Secondary | ICD-10-CM

## 2014-10-06 DIAGNOSIS — H43813 Vitreous degeneration, bilateral: Secondary | ICD-10-CM

## 2014-10-22 ENCOUNTER — Other Ambulatory Visit: Payer: Self-pay | Admitting: Internal Medicine

## 2014-10-30 ENCOUNTER — Encounter: Payer: Self-pay | Admitting: Nurse Practitioner

## 2014-10-30 ENCOUNTER — Non-Acute Institutional Stay: Payer: Medicare Other | Admitting: Nurse Practitioner

## 2014-10-30 ENCOUNTER — Other Ambulatory Visit: Payer: Self-pay

## 2014-10-30 VITALS — BP 140/74 | HR 62 | Wt 131.0 lb

## 2014-10-30 DIAGNOSIS — H01003 Unspecified blepharitis right eye, unspecified eyelid: Secondary | ICD-10-CM

## 2014-10-30 DIAGNOSIS — I10 Essential (primary) hypertension: Secondary | ICD-10-CM

## 2014-10-30 DIAGNOSIS — R413 Other amnesia: Secondary | ICD-10-CM

## 2014-10-30 DIAGNOSIS — K5900 Constipation, unspecified: Secondary | ICD-10-CM

## 2014-10-30 DIAGNOSIS — J309 Allergic rhinitis, unspecified: Secondary | ICD-10-CM

## 2014-10-30 DIAGNOSIS — E876 Hypokalemia: Secondary | ICD-10-CM

## 2014-10-30 DIAGNOSIS — F419 Anxiety disorder, unspecified: Secondary | ICD-10-CM

## 2014-10-30 DIAGNOSIS — J329 Chronic sinusitis, unspecified: Secondary | ICD-10-CM

## 2014-10-30 DIAGNOSIS — R001 Bradycardia, unspecified: Secondary | ICD-10-CM

## 2014-10-30 DIAGNOSIS — H01006 Unspecified blepharitis left eye, unspecified eyelid: Secondary | ICD-10-CM

## 2014-10-30 DIAGNOSIS — I639 Cerebral infarction, unspecified: Secondary | ICD-10-CM

## 2014-10-30 MED ORDER — ERYTHROMYCIN 5 MG/GM OP OINT: 1.0000 "application " | TOPICAL_OINTMENT | Freq: Every day | OPHTHALMIC | Status: DC

## 2014-10-30 MED ORDER — FLUTICASONE PROPIONATE 50 MCG/ACT NA SUSP: 1.0000 | Freq: Every day | NASAL | Status: DC

## 2014-10-30 MED ORDER — LEVOFLOXACIN 250 MG PO TABS: 250.0000 mg | ORAL_TABLET | Freq: Every day | ORAL | Status: DC

## 2014-10-30 NOTE — Assessment & Plan Note (Signed)
Levaquin 250mg  daily x 10day, Flonase I spray each nostril daily. Observe the patient.

## 2014-10-30 NOTE — Assessment & Plan Note (Signed)
R eye injected-no itching. No vision changes. Will apply Erythromycin ophthalmic ointment 1cm ribbon to R+L lower conjunctival sac nightly.

## 2014-10-30 NOTE — Assessment & Plan Note (Signed)
-  Symptoms resolved. -ECHO: Left ventricle: The cavity size was normal. Systolic function was normal. The estimated ejection fraction was in the range of 55% to 60%. Wall motion was normal; there were no regional wall motion abnormalities. Features are consistent with a pseudonormal left ventricular filling pattern, with concomitant abnormal relaxation and increased filling pressure (grade 2 diastolic dysfunction). -Carotid Dopplers: Bilateral - 1% to 39% ICA stenosis.  09/01/14 MRI brain  IMPRESSION: No acute infarction. Extensive chronic small vessel ischemic changes throughout the brain as outlined above.  No major vessel intracranial occlusion. Focal stenoses within the A1 and M1 segments on the right. 50% stenosis of the right M1 segment for place the patient at risk of right MCA territory infarction. Diffuse atherosclerotic narrowing and irregularity within the major posterior circulation branch vessels.  -Continue ASA for secondary CVA prevention.

## 2014-10-30 NOTE — Assessment & Plan Note (Signed)
Daily prn MiraLax is adequate along with diet control.   

## 2014-10-30 NOTE — Assessment & Plan Note (Signed)
Admitted memory lapses, 09/04/14 MMSE 30/30.

## 2014-10-30 NOTE — Assessment & Plan Note (Signed)
EKG with 1st degree block and marked sinus arrhythmia.     

## 2014-10-30 NOTE — Assessment & Plan Note (Signed)
Managed with Loratadine prn.   

## 2014-10-30 NOTE — Assessment & Plan Note (Signed)
Tearful, angry outburst, very low tolerance in general, argumentative at visit 04/2013. She stated that she has lived too long and asked me I can prescribe medication to help her to end her life. Also she said it is the problem that her physical health is reasonable good. She admitted that she has lost weight and her appetite has been poor since she moved from [place] to [place] a year ago, especially since she has moved into [place] apartment @ [facility] from her Apartment @ [place] 2 months ago. The patient admitted that she is unreasonable. She declined increasing her Prozac for her depression management. Unremarkable CBC, and CMP 12/2013. During today's visit: Tearful and paranoia: said her 79 years old sister died recently. Her sister's son who is MD didn't do good job in taking care of her sister and may have given her something to assisted her dying.May be he can give me some pills to help me die.  She wants to decreased her Fluoxetine-I told her she may want to try different agent or adding Abilify to better managing her mood. Also told me that she has difficulty focusing, sleeping, and resting. She said sometimes she has strange dreams with visual hallucination. Hx of fail discontinuation of Fluoxetine.  09/03/14 takes fluoxetine 20mg , stated she sleeps and eats well. But she did question about her purpose of life and wonder how long will take her to expire.  10/30/14 stable.

## 2014-10-30 NOTE — Assessment & Plan Note (Addendum)
K 3.0 08/31/14 09/04/14 K 4.0

## 2014-10-30 NOTE — Progress Notes (Signed)
Patient ID: Alice Swanson, female   DOB: 1920/01/26, 81 y.o.   MRN: [id number]   Code Status: DNR  Allergies  Allergen Reactions  . Penicillins     Chief Complaint  Patient presents with  . Medical Management of Chronic Issues    TIA , follow-up from rehab    HPI: Patient is a 81 y.o. female seen in the clinic at [facility] today for nasal congestive, yellow matter discharge R+L eyelashes, TIA-f/u Rehab,  and other chronic medical conditions.  Problem List Items Addressed This Visit    Sinusitis, chronic    Levaquin  daily x 10day, Flonase I spray each nostril daily. Observe the patient.       Memory deficits - Primary (Chronic)    Admitted memory lapses, 09/04/14 MMSE 30/30.         Hypokalemia (Chronic)    K 3.0 08/31/14 09/04/14 K 4.0      Hypertension    takes Losartan  daily. Mild elevated SBP-will continue to observe her BP       Constipation    Daily prn MiraLax is adequate along with diet control.         Cerebellar infarction    -Symptoms resolved. -ECHO: Left ventricle: The cavity size was normal. Systolic function was normal. The estimated ejection fraction was in the range of 55% to 60%. Wall motion was normal; there were no regional wall motion abnormalities. Features are consistent with a pseudonormal left ventricular filling pattern, with concomitant abnormal relaxation and increased filling pressure (grade 2 diastolic dysfunction). -Carotid Dopplers: Bilateral - 1% to 39% ICA stenosis.  09/01/14 MRI brain  IMPRESSION: No acute infarction. Extensive chronic small vessel ischemic changes throughout the brain as outlined above.  No major vessel intracranial occlusion. Focal stenoses within the A1 and M1 segments on the right. 50% stenosis of the right M1 segment for place the patient at risk of right MCA territory infarction. Diffuse atherosclerotic narrowing and irregularity within the major posterior  circulation branch vessels.  -Continue ASA for secondary CVA prevention.         Bradycardia    EKG with 1st degree block and marked sinus arrhythmia.         Blepharitis of both eyes    R eye injected-no itching. No vision changes. Will apply Erythromycin ophthalmic ointment 1cm ribbon to R+L lower conjunctival sac nightly.       Anxiety (Chronic)    Tearful, angry outburst, very low tolerance in general, argumentative at visit 04/2013. She stated that she has lived too long and asked me I can prescribe medication to help her to end her life. Also she said it is the problem that her physical health is reasonable good. She admitted that she has lost weight and her appetite has been poor since she moved from [place] to [place] a year ago, especially since [place] has moved into [place] apartment @ [facility] from her Apartment @ [place] 2 months ago. The patient admitted that she is unreasonable. She declined increasing her Prozac for her depression management. Unremarkable CBC, and CMP 12/2013. During today's visit: Tearful and paranoia: said her 65 years old sister died recently. Her sister's son who is MD didn't do good job in taking care of her sister and may have given her something to assisted her dying.May be he can give me some pills to help me die.  She wants to decreased her Fluoxetine-I told her she may want to try different agent or adding Abilify  to better managing her mood. Also told me that she has difficulty focusing, sleeping, and resting. She said sometimes she has strange dreams with visual hallucination. Hx of fail discontinuation of Fluoxetine.  09/03/14 takes fluoxetine 20mg , stated she sleeps and eats well. But she did question about her purpose of life and wonder how long will take her to expire.  10/30/14 stable.          Allergic rhinitis    Managed with Loratadine prn.            Review of Systems:  Review of Systems  Constitutional: Negative for fever, chills, weight loss,  malaise/fatigue and diaphoresis.       Generalized  HENT: Positive for congestion and hearing loss. Negative for ear discharge, ear pain, nosebleeds, sore throat and tinnitus.        Nasal congestion and yellow drainage.   Eyes: Positive for discharge and redness. Negative for blurred vision and double vision.       Injected R eye-denied itching or pain. Yellow discharge seen on R+L eyelashes.   Respiratory: Positive for cough. Negative for hemoptysis, sputum production, shortness of breath, wheezing and stridor.   Cardiovascular: Positive for leg swelling. Negative for chest pain, palpitations, orthopnea, claudication and PND.       Trace in ankles.   Gastrointestinal: Negative for heartburn, nausea, vomiting, abdominal pain, diarrhea, constipation, blood in stool and melena.  Genitourinary: Positive for frequency. Negative for dysuria, urgency, hematuria and flank pain.  Musculoskeletal: Negative for myalgias, back pain, joint pain, falls and neck pain.       Ambulates with walker. Slow and wobbly.   Skin: Negative for itching and rash.  Neurological: Positive for weakness. Negative for dizziness, tingling, tremors, sensory change, speech change, focal weakness, seizures, loss of consciousness and headaches.  Endo/Heme/Allergies: Negative for environmental allergies and polydipsia. Does not bruise/bleed easily.  Psychiatric/Behavioral: Positive for depression and memory loss. Negative for suicidal ideas, hallucinations and substance abuse. The patient is nervous/anxious. The patient does not have insomnia.      Past Medical History  Diagnosis Date  . Arthritis   . Depression   . Glaucoma     both eyes  . Hyperlipidemia   . Hypertension   . Cardiac arrhythmia due to congenital heart disease   . Sciatica   . Anxiety   . Allergic rhinitis   . Macular degeneration of both eyes   . TIA (transient ischemic attack) 08/31/14  . Bradycardia 08/31/14  . Heart block AV first degree 08/31/14   . Memory deficit 08/31/14  . Cerebral atrophy 08/31/14  . Cerebrovascular disease 08/31/14  . Cerebellar infarction 08/31/14  . Osteoarthritis 08/31/14   Past Surgical History  Procedure Laterality Date  . Tonsillectomy and adenoidectomy  1926  . Appendectomy  1932  . Abdominal hysterectomy  1962  . Cataract extraction w/ intraocular lens implant Right 12/2011    Dr. Pricilla Holm  . Cataract extraction w/ intraocular lens implant Left 02/2013    Dr. Dione Booze  . Colonoscopy  2005   Social History:   reports that she has never smoked. She has never used smokeless tobacco. She reports that she does not drink alcohol or use illicit drugs.  Family History  Problem Relation Age of Onset  . Heart disease Mother     MI  . Stroke Father     Medications: Patient's Medications  New Prescriptions   No medications on file  Previous Medications   ASPIRIN 325 MG TABLET  Take 1 tablet (325 mg total) by mouth daily.   DORZOLAMIDE-TIMOLOL (COSOPT) 22.3-6.8 MG/ML OPHTHALMIC SOLUTION    Use one drop in both eyes twice daily   FLUOXETINE (PROZAC) 20 MG TABLET    TAKE ONE TABLET BY MOUTH ONCE DAILY   LORATADINE (CLARITIN) 10 MG TABLET    Take 10 mg by mouth daily as needed for allergies.   LOSARTAN (COZAAR) 50 MG TABLET    TAKE 1 TABLET EVERY DAY TO CONTROL BLOOD PRESSURE   MULTIPLE VITAMINS-MINERALS (ICAPS) CAPS    Take 1 capsule by mouth 2 (two) times daily.    POLYETHYLENE GLYCOL (MIRALAX / GLYCOLAX) PACKET    Take 17 g by mouth as needed.  Modified Medications   Modified Medication Previous Medication   ERYTHROMYCIN OPHTHALMIC OINTMENT erythromycin ophthalmic ointment      Place 1 application into both eyes at bedtime. Injected in lower sac for drainage nightly    Place 1 application into both eyes at bedtime. Injected in lower sac for drainage nightly   FLUTICASONE (FLONASE) 50 MCG/ACT NASAL SPRAY fluticasone (FLONASE) 50 MCG/ACT nasal spray      Place 1 spray into both nostrils daily.    Place 1  spray into both nostrils daily.   LEVOFLOXACIN (LEVAQUIN) 250 MG TABLET levofloxacin (LEVAQUIN) 250 MG tablet      Take 1 tablet (250 mg total) by mouth daily.    Take 250 mg by mouth daily.  Discontinued Medications   No medications on file     Physical Exam: Physical Exam  Constitutional: She is oriented to person, place, and time. She appears well-developed and well-nourished. No distress.  HENT:  Head: Normocephalic and atraumatic.  Right Ear: External ear normal.  Left Ear: External ear normal.  Nose: Nose normal.  Mouth/Throat: Oropharynx is clear and moist. No oropharyngeal exudate.  Nasal congestion and yellow drainage.   Eyes: EOM are normal. Pupils are equal, round, and reactive to light. Right eye exhibits discharge. Left eye exhibits discharge. Right conjunctiva is injected. No scleral icterus.  Yellow matter discharge on R+L eyelashes.   Neck: Normal range of motion. Neck supple. No JVD present. No tracheal deviation present. No thyromegaly present.  Cardiovascular: Normal rate, regular rhythm, normal heart sounds and intact distal pulses.   No murmur heard. Pulmonary/Chest: Effort normal and breath sounds normal. No stridor. No respiratory distress. She has no wheezes. She has no rales. She exhibits no tenderness.  Abdominal: Soft. Bowel sounds are normal. She exhibits no distension. There is no tenderness. There is no rebound and no guarding.  Genitourinary: Vagina normal. Guaiac negative stool. No vaginal discharge found.  External hemorrhoids-stable.   Musculoskeletal: Normal range of motion. She exhibits no edema or tenderness.  Lymphadenopathy:    She has no cervical adenopathy.  Neurological: She is alert and oriented to person, place, and time. She displays normal reflexes. No cranial nerve deficit. She exhibits normal muscle tone. Coordination normal.  Skin: Skin is warm and dry. No rash noted. She is not diaphoretic. No erythema. No pallor.  Psychiatric: Her  mood appears anxious. Her affect is angry, blunt, labile and inappropriate. Her speech is not rapid and/or pressured, not delayed, not tangential and not slurred. She is agitated, aggressive and hyperactive. She is not slowed, not withdrawn, not actively hallucinating and not combative. Thought content is not paranoid and not delusional. Cognition and memory are not impaired. She expresses impulsivity and inappropriate judgment. She exhibits a depressed mood. She expresses no homicidal and no  suicidal ideation. She expresses no suicidal plans and no homicidal plans. She is communicative. She exhibits normal recent memory and normal remote memory. She is attentive.    Filed Vitals:   10/30/14 1600  BP: 140/74  Pulse: 62  Weight: 131 lb (59.421 kg)  SpO2: 97%      Labs reviewed: Basic Metabolic Panel:  Recent Labs  16/10/96 08/31/14 1404 08/31/14 1426 09/04/14  NA 140 140 143 142  K 3.6 3.1* 3.0* 4.0  CL  --  105 107  --   CO2  --  22  --   --   GLUCOSE  --  100* 99  --   BUN 18 17 16 19   CREATININE 0.8 0.76 0.70  --   CALCIUM  --  9.2  --   --   TSH  --   --   --  1.94   Liver Function Tests:  Recent Labs  12/26/13 08/31/14 1404 09/04/14  AST 14 17 19   ALT 11 11 13   ALKPHOS 84 93 82  BILITOT  --  0.3  --   PROT  --  7.3  --   ALBUMIN  --  3.2*  --    No results for input(s): LIPASE, AMYLASE in the last 8760 hours. No results for input(s): AMMONIA in the last 8760 hours. CBC:  Recent Labs  08/31/14 1404 08/31/14 1426  WBC 7.8  --   NEUTROABS 4.4  --   HGB 13.4 13.9  HCT 41.1 41.0  MCV 92.6  --   PLT 217  --    Lipid Panel:  Recent Labs  12/26/13 09/01/14 0445  CHOL 213* 198  HDL 56 46  LDLCALC 22 132*  TRIG 112 101  CHOLHDL  --  4.3   Anemia Panel: No results for input(s): FOLATE, IRON, VITAMINB12 in the last 8760 hours.  Past Procedures:  08/31/14 CT head w/o contrast:  IMPRESSION: 1. No acute intracranial abnormality. 2. Cerebral atrophy  and small vessel ischemic change. 3. Sinus disease.   Assessment/Plan Memory deficits Admitted memory lapses, 09/04/14 MMSE 30/30.      Allergic rhinitis Managed with Loratadine prn.      Anxiety Tearful, angry outburst, very low tolerance in general, argumentative at visit 04/2013. She stated that she has lived too long and asked me I can prescribe medication to help her to end her life. Also she said it is the problem that her physical health is reasonable good. She admitted that she has lost weight and her appetite has been poor since she moved from [place] to [place] a year ago, especially since [place] has [place] into [place] apartment @ [facility] from her Apartment @ [place] 2 months ago. The patient admitted that she is unreasonable. She declined increasing her Prozac for her depression management. Unremarkable CBC, and CMP 12/2013. During today's visit: Tearful and paranoia: said her 74 years old sister died recently. Her sister's son who is MD didn't do good job in taking care of her sister and may have given her something to assisted her dying.May be he can give me some pills to help me die.  She wants to decreased her Fluoxetine-I told her she may want to try different agent or adding Abilify to better managing her mood. Also told me that she has difficulty focusing, sleeping, and resting. She said sometimes she has strange dreams with visual hallucination. Hx of fail discontinuation of Fluoxetine.  09/03/14 takes fluoxetine 20mg , stated she sleeps and eats well. But  she did question about her purpose of life and wonder how long will take her to expire.  10/30/14 stable.       Bradycardia EKG with 1st degree block and marked sinus arrhythmia.      Constipation Daily prn MiraLax is adequate along with diet control.      Hypertension takes Losartan 50mg  daily. Mild elevated SBP-will continue to observe her BP    Hypokalemia K 3.0 08/31/14 09/04/14 K 4.0   Cerebellar infarction -Symptoms  resolved. -ECHO: Left ventricle: The cavity size was normal. Systolic function was normal. The estimated ejection fraction was in the range of 55% to 60%. Wall motion was normal; there were no regional wall motion abnormalities. Features are consistent with a pseudonormal left ventricular filling pattern, with concomitant abnormal relaxation and increased filling pressure (grade 2 diastolic dysfunction). -Carotid Dopplers: Bilateral - 1% to 39% ICA stenosis.  09/01/14 MRI brain  IMPRESSION: No acute infarction. Extensive chronic small vessel ischemic changes throughout the brain as outlined above.  No major vessel intracranial occlusion. Focal stenoses within the A1 and M1 segments on the right. 50% stenosis of the right M1 segment for place the patient at risk of right MCA territory infarction. Diffuse atherosclerotic narrowing and irregularity within the major posterior circulation branch vessels.  -Continue ASA for secondary CVA prevention.      Blepharitis of both eyes R eye injected-no itching. No vision changes. Will apply Erythromycin ophthalmic ointment 1cm ribbon to R+L lower conjunctival sac nightly.    Sinusitis, chronic Levaquin 250mg  daily x 10day, Flonase I spray each nostril daily. Observe the patient.      Family/ Staff Communication: observe the patient.   Goals of Care: [place]  Labs/tests ordered: none

## 2014-10-30 NOTE — Assessment & Plan Note (Signed)
takes Losartan 50mg  daily. Mild elevated SBP-will continue to observe her BP

## 2015-01-20 LAB — HEPATIC FUNCTION PANEL
ALT: 8 U/L (ref 7–35)
AST: 15 U/L (ref 13–35)
Alkaline Phosphatase: 74 U/L (ref 25–125)
Bilirubin, Total: 0.4 mg/dL

## 2015-01-20 LAB — CBC AND DIFFERENTIAL
HCT: 41 % (ref 36–46)
Hemoglobin: 13.6 g/dL (ref 12.0–16.0)
Platelets: 177 10*3/uL (ref 150–399)
WBC: 7 10*3/mL

## 2015-01-20 LAB — BASIC METABOLIC PANEL
BUN: 22 mg/dL — AB (ref 4–21)
CREATININE: 0.9 mg/dL (ref 0.5–1.1)
Glucose: 129 mg/dL
Potassium: 3.3 mmol/L — AB (ref 3.4–5.3)
SODIUM: 142 mmol/L (ref 137–147)

## 2015-01-22 ENCOUNTER — Encounter: Payer: Self-pay | Admitting: Nurse Practitioner

## 2015-01-22 ENCOUNTER — Non-Acute Institutional Stay: Payer: Medicare Other | Admitting: Nurse Practitioner

## 2015-01-22 VITALS — BP 164/78 | HR 68 | Temp 97.7°F | Wt 132.0 lb

## 2015-01-22 DIAGNOSIS — E876 Hypokalemia: Secondary | ICD-10-CM

## 2015-01-22 DIAGNOSIS — F329 Major depressive disorder, single episode, unspecified: Secondary | ICD-10-CM | POA: Diagnosis not present

## 2015-01-22 DIAGNOSIS — I639 Cerebral infarction, unspecified: Secondary | ICD-10-CM

## 2015-01-22 DIAGNOSIS — H01006 Unspecified blepharitis left eye, unspecified eyelid: Secondary | ICD-10-CM

## 2015-01-22 DIAGNOSIS — H01003 Unspecified blepharitis right eye, unspecified eyelid: Secondary | ICD-10-CM | POA: Diagnosis not present

## 2015-01-22 DIAGNOSIS — K5900 Constipation, unspecified: Secondary | ICD-10-CM | POA: Diagnosis not present

## 2015-01-22 DIAGNOSIS — F32A Depression, unspecified: Secondary | ICD-10-CM

## 2015-01-22 DIAGNOSIS — I10 Essential (primary) hypertension: Secondary | ICD-10-CM

## 2015-01-22 MED ORDER — MIRTAZAPINE 7.5 MG PO TABS
ORAL_TABLET | ORAL | Status: DC
Start: 2015-01-22 — End: 2015-02-27

## 2015-01-22 NOTE — Assessment & Plan Note (Signed)
Stated her appetite is poor, doesn't sleep at night-just watching her clock but doing nothing in bed, appears exhausted, wants to get out here, taking Prozac 20mg  daily, urine culture is unremarkable for UTI, will try Mirtazapine 7.5mg  nightly.

## 2015-01-22 NOTE — Assessment & Plan Note (Signed)
Resolved R eye injected-no itching. No vision changes. Continue Erythromycin ophthalmic ointment 1cm ribbon to R+L lower conjunctival sac nightly.

## 2015-01-22 NOTE — Progress Notes (Signed)
Patient ID: Alice Swanson, female   DOB: [birth year], 81 y.o.   MRN: [id number]   Code Status: full code  Allergies  Allergen Reactions  . Penicillins     Chief Complaint  Patient presents with  . Fall    3 times since the week-end "didn't hurt anything" Was outside, the  wind was strong, walker got away from her. Second time got up in a hurt got up from sleeping to answer door. Third time at shop in a hurt.   . Dizziness    this week off and on, better in morning, worse today    HPI: Patient is a 81 y.o. female seen in the clinic at [facility] today for falling, depression,  and other chronic medical conditions.  Problem List Items Addressed This Visit    Depression (Chronic)    Stated her appetite is poor, doesn't sleep at night-just watching her clock but doing nothing in bed, appears exhausted, wants to get out here, taking Prozac 20mg  daily, urine culture is unremarkable for UTI, will try Mirtazapine 7.5mg  nightly.       Relevant Medications   Mirtazapine (REMERON) tablet   Hypokalemia - Primary (Chronic)    K 3.0 08/31/14- 09/04/14 K 4.9 01/20/15 3.3 Repeat BMP one week.       Hypertension    takes Losartan 50mg  daily. Mild elevated SBP-will continue to observe her BP        Constipation    Daily prn MiraLax is adequate along with diet control.        Cerebellar infarction    Symptoms resolved. -ECHO: Left ventricle: The cavity size was normal. Systolic function was normal. The estimated ejection fraction was in the range of 55% to 60%. Wall motion was normal; there were no regional wall motion abnormalities. Features are consistent with a pseudonormal left ventricular filling pattern, with concomitant abnormal relaxation and increased filling pressure (grade 2 diastolic dysfunction). -Carotid Dopplers: Bilateral - 1% to 39% ICA stenosis.  09/01/14 MRI brain  IMPRESSION: No acute infarction. Extensive chronic small vessel ischemic  changes throughout the brain as outlined above.  No major vessel intracranial occlusion. Focal stenoses within the A1 and M1 segments on the right. 50% stenosis of the right M1 segment for place the patient at risk of right MCA territory infarction. Diffuse atherosclerotic narrowing and irregularity within the major posterior circulation branch vessels.  -Continue ASA for secondary CVA prevention.  -contributory to falling.        Blepharitis of both eyes    Resolved R eye injected-no itching. No vision changes. Continue Erythromycin ophthalmic ointment 1cm ribbon to R+L lower conjunctival sac nightly.          Review of Systems:  Review of Systems  Constitutional: Negative for fever, chills and diaphoresis.       Generalized  HENT: Positive for hearing loss. Negative for congestion, ear discharge, ear pain, nosebleeds, sore throat and tinnitus.   Eyes: Positive for discharge and redness.       Injected R eye-denied itching or pain. Yellow discharge seen on R+L eyelashes.   Respiratory: Positive for cough. Negative for shortness of breath, wheezing and stridor.   Cardiovascular: Positive for leg swelling. Negative for chest pain and palpitations.       Trace in ankles.   Gastrointestinal: Negative for nausea, vomiting, abdominal pain, diarrhea, constipation and blood in stool.  Endocrine: Negative for polydipsia.  Genitourinary: Positive for frequency. Negative for dysuria, urgency, hematuria and flank pain.  Musculoskeletal: Positive for gait problem. Negative for myalgias, back pain and neck pain.       Ambulates with walker. Slow and wobbly. falling  Skin: Negative for rash.  Allergic/Immunologic: Negative for environmental allergies.  Neurological: Positive for weakness. Negative for dizziness, tremors, seizures and headaches.  Hematological: Does not bruise/bleed easily.  Psychiatric/Behavioral: Negative for suicidal ideas and hallucinations. The patient is  nervous/anxious.        Expressed depression, not sleep at night, wants to get out of here      Past Medical History  Diagnosis Date  . Arthritis   . Depression   . Glaucoma     both eyes  . Hyperlipidemia   . Hypertension   . Cardiac arrhythmia due to congenital heart disease   . Sciatica   . Anxiety   . Allergic rhinitis   . Macular degeneration of both eyes   . TIA (transient ischemic attack) 08/31/14  . Bradycardia 08/31/14  . Heart block AV first degree 08/31/14  . Memory deficit 08/31/14  . Cerebral atrophy 08/31/14  . Cerebrovascular disease 08/31/14  . Cerebellar infarction 08/31/14  . Osteoarthritis 08/31/14  . Hypokalemia    Past Surgical History  Procedure Laterality Date  . Tonsillectomy and adenoidectomy  1926  . Appendectomy  1932  . Abdominal hysterectomy  1962  . Cataract extraction w/ intraocular lens implant Right 12/2011    Dr. Pricilla Holmucker  . Cataract extraction w/ intraocular lens implant Left 02/2013    Dr. Dione BoozeGroat  . Colonoscopy  2005   Social History:   reports that she has never smoked. She has never used smokeless tobacco. She reports that she does not drink alcohol or use illicit drugs.  Family History  Problem Relation Age of Onset  . Heart disease Mother     MI  . Stroke Father     Medications: Patient's Medications  New Prescriptions   MIRTAZAPINE (REMERON) 7.5 MG TABLET    Take one tablet at bedtime for rest  Previous Medications   ASPIRIN 325 MG TABLET    Take 1 tablet (325 mg total) by mouth daily.   DORZOLAMIDE (TRUSOPT) 2 % OPHTHALMIC SOLUTION    Use one drop in both eyes at bedtime   ERYTHROMYCIN OPHTHALMIC OINTMENT    Place 1 application into both eyes at bedtime. Injected in lower sac for drainage nightly   FLUOXETINE (PROZAC) 20 MG TABLET    TAKE ONE TABLET BY MOUTH ONCE DAILY   FLUTICASONE (FLONASE) 50 MCG/ACT NASAL SPRAY    Place 1 spray into both nostrils daily.   LORATADINE (CLARITIN) 10 MG TABLET    Take 10 mg by mouth  daily as needed for allergies.   LOSARTAN (COZAAR) 50 MG TABLET    TAKE 1 TABLET EVERY DAY TO CONTROL BLOOD PRESSURE   MULTIPLE VITAMINS-MINERALS (ICAPS) CAPS    Take 1 capsule by mouth 2 (two) times daily.    POLYETHYLENE GLYCOL (MIRALAX / GLYCOLAX) PACKET    Take 17 g by mouth as needed.   TIMOLOL (TIMOPTIC) 0.5 % OPHTHALMIC SOLUTION    One drop in both eyes at bedtime  Modified Medications   No medications on file  Discontinued Medications   DORZOLAMIDE-TIMOLOL (COSOPT) 22.3-6.8 MG/ML OPHTHALMIC SOLUTION    Use one drop in both eyes twice daily   LEVOFLOXACIN (LEVAQUIN) 250 MG TABLET    Take 1 tablet (250 mg total) by mouth daily.     Physical Exam: Physical Exam  Constitutional: She is oriented to  person, place, and time. She appears well-developed and well-nourished. No distress.  HENT:  Head: Normocephalic and atraumatic.  Right Ear: External ear normal.  Left Ear: External ear normal.  Nose: Nose normal.  Mouth/Throat: Oropharynx is clear and moist. No oropharyngeal exudate.  Eyes: EOM are normal. Pupils are equal, round, and reactive to light. Right eye exhibits no discharge. Left eye exhibits no discharge. Right conjunctiva is injected. No scleral icterus.  Neck: Normal range of motion. Neck supple. No JVD present. No tracheal deviation present. No thyromegaly present.  Cardiovascular: Normal rate, regular rhythm, normal heart sounds and intact distal pulses.   No murmur heard. Pulmonary/Chest: Effort normal and breath sounds normal. No stridor. No respiratory distress. She has no wheezes. She has no rales. She exhibits no tenderness.  Abdominal: Soft. Bowel sounds are normal. She exhibits no distension. There is no tenderness. There is no rebound and no guarding.  Genitourinary: Vagina normal. Guaiac negative stool. No vaginal discharge found.  External hemorrhoids-stable.   Musculoskeletal: Normal range of motion. She exhibits no edema or tenderness.  Lymphadenopathy:     She has no cervical adenopathy.  Neurological: She is alert and oriented to person, place, and time. She displays normal reflexes. No cranial nerve deficit. She exhibits normal muscle tone. Coordination normal.  Skin: Skin is warm and dry. No rash noted. She is not diaphoretic. No erythema. No pallor.  Psychiatric: Her mood appears anxious. Her speech is not rapid and/or pressured, not delayed, not tangential and not slurred. She is aggressive. She is not agitated, not hyperactive, not slowed, not withdrawn, not actively hallucinating and not combative. Thought content is not paranoid and not delusional. Cognition and memory are not impaired. She expresses impulsivity. She does not express inappropriate judgment. She exhibits a depressed mood. She expresses no homicidal and no suicidal ideation. She expresses no suicidal plans and no homicidal plans. She is communicative. She exhibits normal recent memory and normal remote memory. She is attentive.    Filed Vitals:   01/22/15 1339  BP: 164/78  Pulse: 68  Temp: 97.7 F (36.5 C)  TempSrc: Oral  Weight: 132 lb (59.875 kg)  SpO2: 99%      Labs reviewed: Basic Metabolic Panel:  Recent Labs  16/10/96 1404 08/31/14 1426 09/04/14 01/20/15  NA 140 143 142 142  K 3.1* 3.0* 4.0 3.3*  CL 105 107  --   --   CO2 22  --   --   --   GLUCOSE 100* 99  --   --   BUN 22*  CREATININE 0.76 0.70  --  0.9  CALCIUM 9.2  --   --   --   TSH  --   --  1.94  --    Liver Function Tests:  Recent Labs  08/31/14 1404 09/04/14 01/20/15  AST ALT ALKPHOS 93 82 74  BILITOT 0.3  --   --   PROT 7.3  --   --   ALBUMIN 3.2*  --   --    No results for input(s): LIPASE, AMYLASE in the last 8760 hours. No results for input(s): AMMONIA in the last 8760 hours. CBC:  Recent Labs  08/31/14 1404 08/31/14 1426 01/20/15  WBC 7.8  --  7.0  NEUTROABS 4.4  --   --   HGB 13.4 13.9 13.6  HCT 41.1 41.0 41  MCV 92.6  --   --   PLT 217   --  177   Lipid Panel:  Recent Labs  09/01/14 0445  CHOL 198  HDL 46  LDLCALC 132*  TRIG 101  CHOLHDL 4.3   Anemia Panel: No results for input(s): FOLATE, IRON, VITAMINB12 in the last 8760 hours.  Past Procedures:   08/31/14 CT head w/o contrast:  IMPRESSION: 1. No acute intracranial abnormality. 2. Cerebral atrophy and small vessel ischemic change. 3. Sinus disease.  09/01/14 MRI brain, MRA head  IMPRESSION: No acute infarction. Extensive chronic small vessel ischemic changes throughout the brain as outlined above.  No major vessel intracranial occlusion. Focal stenoses within the A1 and M1 segments on the right. 50% stenosis of the right M1 segment for place the patient at risk of right MCA territory infarction. Diffuse atherosclerotic narrowing and irregularity within the major posterior circulation branch vessels.  09/01/14 echocardiogram: EF 55-60%  Assessment/Plan Hypokalemia K 3.0 08/31/14- 09/04/14 K 4.9 01/20/15 3.3 Repeat BMP one week.    Depression Stated her appetite is poor, doesn't sleep at night-just watching her clock but doing nothing in bed, appears exhausted, wants to get out here, taking Prozac  daily, urine culture is unremarkable for UTI, will try Mirtazapine 7.5mg  nightly.    Hypertension takes Losartan  daily. Mild elevated SBP-will continue to observe her BP     Constipation Daily prn MiraLax is adequate along with diet control.     Blepharitis of both eyes Resolved R eye injected-no itching. No vision changes. Continue Erythromycin ophthalmic ointment 1cm ribbon to R+L lower conjunctival sac nightly.    Cerebellar infarction Symptoms resolved. -ECHO: Left ventricle: The cavity size was normal. Systolic function was normal. The estimated ejection fraction was in the range of 55% to 60%. Wall motion was normal; there were no regional wall motion abnormalities. Features are consistent with a  pseudonormal left ventricular filling pattern, with concomitant abnormal relaxation and increased filling pressure (grade 2 diastolic dysfunction). -Carotid Dopplers: Bilateral - 1% to 39% ICA stenosis.  09/01/14 MRI brain  IMPRESSION: No acute infarction. Extensive chronic small vessel ischemic changes throughout the brain as outlined above.  No major vessel intracranial occlusion. Focal stenoses within the A1 and M1 segments on the right. 50% stenosis of the right M1 segment for place the patient at risk of right MCA territory infarction. Diffuse atherosclerotic narrowing and irregularity within the major posterior circulation branch vessels.  -Continue ASA for secondary CVA prevention.  -contributory to falling.       Family/ Staff Communication: observe the patient.   Goals of Care: [place]  Labs/tests ordered: CBC, CMP, Urine culture done 01/20/15

## 2015-01-22 NOTE — Assessment & Plan Note (Signed)
Daily prn MiraLax is adequate along with diet control.   

## 2015-01-22 NOTE — Assessment & Plan Note (Signed)
takes Losartan 50mg  daily. Mild elevated SBP-will continue to observe her BP

## 2015-01-22 NOTE — Assessment & Plan Note (Signed)
K 3.0 08/31/14- 09/04/14 K 4.9 01/20/15 3.3 Repeat BMP one week.

## 2015-01-22 NOTE — Assessment & Plan Note (Signed)
Symptoms resolved. -ECHO: Left ventricle: The cavity size was normal. Systolic function was normal. The estimated ejection fraction was in the range of 55% to 60%. Wall motion was normal; there were no regional wall motion abnormalities. Features are consistent with a pseudonormal left ventricular filling pattern, with concomitant abnormal relaxation and increased filling pressure (grade 2 diastolic dysfunction). -Carotid Dopplers: Bilateral - 1% to 39% ICA stenosis.  09/01/14 MRI brain  IMPRESSION: No acute infarction. Extensive chronic small vessel ischemic changes throughout the brain as outlined above.  No major vessel intracranial occlusion. Focal stenoses within the A1 and M1 segments on the right. 50% stenosis of the right M1 segment for place the patient at risk of right MCA territory infarction. Diffuse atherosclerotic narrowing and irregularity within the major posterior circulation branch vessels.  -Continue ASA for secondary CVA prevention.  -contributory to falling.

## 2015-02-05 ENCOUNTER — Non-Acute Institutional Stay: Payer: Medicare Other | Admitting: Nurse Practitioner

## 2015-02-05 ENCOUNTER — Encounter: Payer: Self-pay | Admitting: Nurse Practitioner

## 2015-02-05 VITALS — BP 150/80 | HR 64 | Wt 134.0 lb

## 2015-02-05 DIAGNOSIS — I10 Essential (primary) hypertension: Secondary | ICD-10-CM | POA: Diagnosis not present

## 2015-02-05 DIAGNOSIS — F329 Major depressive disorder, single episode, unspecified: Secondary | ICD-10-CM

## 2015-02-05 DIAGNOSIS — R413 Other amnesia: Secondary | ICD-10-CM

## 2015-02-05 DIAGNOSIS — F32A Depression, unspecified: Secondary | ICD-10-CM

## 2015-02-05 DIAGNOSIS — E876 Hypokalemia: Secondary | ICD-10-CM

## 2015-02-05 DIAGNOSIS — K5900 Constipation, unspecified: Secondary | ICD-10-CM | POA: Diagnosis not present

## 2015-02-05 NOTE — Assessment & Plan Note (Signed)
Controlled, takes Losartan 50mg daily.   

## 2015-02-05 NOTE — Assessment & Plan Note (Signed)
Daily prn MiraLax is adequate along with diet control.   

## 2015-02-05 NOTE — Assessment & Plan Note (Addendum)
Stated her appetite is poor, doesn't sleep at night-just watching her clock but doing nothing in bed, appears exhausted, wants to get out here, taking Prozac 20mg  daily, urine culture is unremarkable for UTI, trial of Mirtazapine 7.5mg  nightly  02/05/15 c/o too sleepy-desires to dc Mirtazapine.

## 2015-02-05 NOTE — Assessment & Plan Note (Addendum)
K 3.0 08/31/14- 09/04/14 K 4.9 01/20/15 3.3 01/29/15 K 3.2 02/05/15 Kcl 10meq daily, f/u BMP two weeks

## 2015-02-05 NOTE — Assessment & Plan Note (Signed)
Admitted memory lapses, 09/04/14 MMSE 30/30.

## 2015-02-06 NOTE — Progress Notes (Signed)
Patient ID: Alice Swanson, female   DOB: [birth year], 81 y.o.   MRN: [id number]   Code Status: full code  Allergies  Allergen Reactions  . Penicillins     Chief Complaint  Patient presents with  . Medical Management of Chronic Issues    memory, anxiety, blood pressure    HPI: Patient is a 81 y.o. female [facility] today for c/o too slepy and other chronic medical conditions.  Problem List Items Addressed This Visit    Depression (Chronic)    Stated her appetite is poor, doesn't sleep at night-just watching her clock but doing nothing in bed, appears exhausted, wants to get out here, taking Prozac 20mg  daily, urine culture is unremarkable for UTI, trial of Mirtazapine 7.5mg  nightly  02/05/15 c/o too sleepy-desires to dc Mirtazapine.        Memory deficits (Chronic)    Admitted memory lapses, 09/04/14 MMSE 30/30.      Hypokalemia (Chronic)    K 3.0 08/31/14- 09/04/14 K 4.9 01/20/15 3.3 01/29/15 K 3.2 02/05/15 Kcl 10meq daily, f/u BMP two weeks       Hypertension - Primary    Controlled, takes Losartan 50mg  daily.          Constipation    Daily prn MiraLax is adequate along with diet control.            Review of Systems:  Review of Systems  Constitutional: Negative for fever, chills and diaphoresis.       Generalized  HENT: Positive for hearing loss. Negative for congestion, ear discharge, ear pain, nosebleeds, sore throat and tinnitus.   Eyes: Positive for discharge and redness.       Injected R eye-denied itching or pain. Yellow discharge seen on R+L eyelashes.   Respiratory: Positive for cough. Negative for shortness of breath, wheezing and stridor.   Cardiovascular: Positive for leg swelling. Negative for chest pain and palpitations.       Trace in ankles.   Gastrointestinal: Negative for nausea, vomiting, abdominal pain, diarrhea, constipation and blood in stool.  Endocrine: Negative for polydipsia.  Genitourinary: Positive for  frequency. Negative for dysuria, urgency, hematuria and flank pain.  Musculoskeletal: Positive for gait problem. Negative for myalgias, back pain and neck pain.       Ambulates with walker. Slow and wobbly. falling  Skin: Negative for rash.  Allergic/Immunologic: Negative for environmental allergies.  Neurological: Positive for weakness. Negative for dizziness, tremors, seizures and headaches.  Hematological: Does not bruise/bleed easily.  Psychiatric/Behavioral: Negative for suicidal ideas and hallucinations. The patient is nervous/anxious.        Expressed depression, not sleep at night, wants to get out of here      Past Medical History  Diagnosis Date  . Arthritis   . Depression   . Glaucoma     both eyes  . Hyperlipidemia   . Hypertension   . Cardiac arrhythmia due to congenital heart disease   . Sciatica   . Anxiety   . Allergic rhinitis   . Macular degeneration of both eyes   . TIA (transient ischemic attack) 08/31/14  . Bradycardia 08/31/14  . Heart block AV first degree 08/31/14  . Memory deficit 08/31/14  . Cerebral atrophy 08/31/14  . Cerebrovascular disease 08/31/14  . Cerebellar infarction 08/31/14  . Osteoarthritis 08/31/14  . Hypokalemia    Past Surgical History  Procedure Laterality Date  . Tonsillectomy and adenoidectomy  1926  . Appendectomy  1932  .  Abdominal hysterectomy  1962  . Cataract extraction w/ intraocular lens implant Right 12/2011    Dr. Pricilla Holm  . Cataract extraction w/ intraocular lens implant Left 02/2013    Dr. Dione Booze  . Colonoscopy  2005   Social History:   reports that she has never smoked. She has never used smokeless tobacco. She reports that she does not drink alcohol or use illicit drugs.  Family History  Problem Relation Age of Onset  . Heart disease Mother     MI  . Stroke Father     Medications: Patient's Medications  New Prescriptions   No medications on file  Previous Medications   ASPIRIN 325 MG TABLET    Take 1  tablet (325 mg total) by mouth daily.   DORZOLAMIDE (TRUSOPT) 2 % OPHTHALMIC SOLUTION    Use one drop in both eyes at bedtime   ERYTHROMYCIN OPHTHALMIC OINTMENT    Place 1 application into both eyes at bedtime. Injected in lower sac for drainage nightly   FLUOXETINE (PROZAC) 20 MG TABLET    TAKE ONE TABLET BY MOUTH ONCE DAILY   FLUTICASONE (FLONASE) 50 MCG/ACT NASAL SPRAY    Place 1 spray into both nostrils daily.   LORATADINE (CLARITIN) 10 MG TABLET    Take 10 mg by mouth daily as needed for allergies.   LOSARTAN (COZAAR) 50 MG TABLET    TAKE 1 TABLET EVERY DAY TO CONTROL BLOOD PRESSURE   MIRTAZAPINE (REMERON) 7.5 MG TABLET    Take one tablet at bedtime for rest   MULTIPLE VITAMINS-MINERALS (ICAPS) CAPS    Take 1 capsule by mouth 2 (two) times daily.    POLYETHYLENE GLYCOL (MIRALAX / GLYCOLAX) PACKET    Take 17 g by mouth as needed.   TIMOLOL (TIMOPTIC) 0.5 % OPHTHALMIC SOLUTION    One drop in both eyes at bedtime  Modified Medications   No medications on file  Discontinued Medications   No medications on file     Physical Exam: Physical Exam  Constitutional: She is oriented to person, place, and time. She appears well-developed and well-nourished. No distress.  HENT:  Head: Normocephalic and atraumatic.  Right Ear: External ear normal.  Left Ear: External ear normal.  Nose: Nose normal.  Mouth/Throat: Oropharynx is clear and moist. No oropharyngeal exudate.  Eyes: EOM are normal. Pupils are equal, round, and reactive to light. Right eye exhibits no discharge. Left eye exhibits no discharge. Right conjunctiva is injected. No scleral icterus.  Neck: Normal range of motion. Neck supple. No JVD present. No tracheal deviation present. No thyromegaly present.  Cardiovascular: Normal rate, regular rhythm, normal heart sounds and intact distal pulses.   No murmur heard. Pulmonary/Chest: Effort normal and breath sounds normal. No stridor. No respiratory distress. She has no wheezes. She has  no rales. She exhibits no tenderness.  Abdominal: Soft. Bowel sounds are normal. She exhibits no distension. There is no tenderness. There is no rebound and no guarding.  Genitourinary: Vagina normal. Guaiac negative stool. No vaginal discharge found.  External hemorrhoids-stable.   Musculoskeletal: Normal range of motion. She exhibits no edema or tenderness.  Lymphadenopathy:    She has no cervical adenopathy.  Neurological: She is alert and oriented to person, place, and time. She displays normal reflexes. No cranial nerve deficit. She exhibits normal muscle tone. Coordination normal.  Skin: Skin is warm and dry. No rash noted. She is not diaphoretic. No erythema. No pallor.  Psychiatric: Her mood appears anxious. Her speech is not rapid  and/or pressured, not delayed, not tangential and not slurred. She is aggressive. She is not agitated, not hyperactive, not slowed, not withdrawn, not actively hallucinating and not combative. Thought content is not paranoid and not delusional. Cognition and memory are not impaired. She expresses impulsivity. She does not express inappropriate judgment. She exhibits a depressed mood. She expresses no homicidal and no suicidal ideation. She expresses no suicidal plans and no homicidal plans. She is communicative. She exhibits normal recent memory and normal remote memory. She is attentive.    Filed Vitals:   02/05/15 1413  BP: 150/80  Pulse: 64  Weight: 134 lb (60.782 kg)  SpO2: 98%      Labs reviewed: Basic Metabolic Panel:  Recent Labs  96/04/54 1404 08/31/14 1426 09/04/14 01/20/15  NA 140 143 142 142  K 3.1* 3.0* 4.0 3.3*  CL 105 107  --   --   CO2 22  --   --   --   GLUCOSE 100* 99  --   --   BUN 22*  CREATININE 0.76 0.70  --  0.9  CALCIUM 9.2  --   --   --   TSH  --   --  1.94  --    Liver Function Tests:  Recent Labs  08/31/14 1404 09/04/14 01/20/15  AST ALT ALKPHOS 93 82 74  BILITOT 0.3  --   --     PROT 7.3  --   --   ALBUMIN 3.2*  --   --    No results for input(s): LIPASE, AMYLASE in the last 8760 hours. No results for input(s): AMMONIA in the last 8760 hours. CBC:  Recent Labs  08/31/14 1404 08/31/14 1426 01/20/15  WBC 7.8  --  7.0  NEUTROABS 4.4  --   --   HGB 13.4 13.9 13.6  HCT 41.1 41.0 41  MCV 92.6  --   --   PLT 217  --  177   Lipid Panel:  Recent Labs  09/01/14 0445  CHOL 198  HDL 46  LDLCALC 132*  TRIG 101  CHOLHDL 4.3   Anemia Panel: No results for input(s): FOLATE, IRON, VITAMINB12 in the last 8760 hours.  Past Procedures:   08/31/14 CT head w/o contrast:  IMPRESSION: 1. No acute intracranial abnormality. 2. Cerebral atrophy and small vessel ischemic change. 3. Sinus disease.  09/01/14 MRI brain, MRA head  IMPRESSION: No acute infarction. Extensive chronic small vessel ischemic changes throughout the brain as outlined above.  No major vessel intracranial occlusion. Focal stenoses within the A1 and M1 segments on the right. 50% stenosis of the right M1 segment for place the patient at risk of right MCA territory infarction. Diffuse atherosclerotic narrowing and irregularity within the major posterior circulation branch vessels.  09/01/14 echocardiogram: EF 55-60%  Assessment/Plan Hypertension Controlled, takes Losartan  daily.       Depression Stated her appetite is poor, doesn't sleep at night-just watching her clock but doing nothing in bed, appears exhausted, wants to get out here, taking Prozac  daily, urine culture is unremarkable for UTI, trial of Mirtazapine 7.5mg  nightly  02/05/15 c/o too sleepy-desires to dc Mirtazapine.     Constipation Daily prn MiraLax is adequate along with diet control.      Hypokalemia K 3.0 08/31/14- 09/04/14 K 4.9 01/20/15 3.3 01/29/15 K 3.2 02/05/15 Kcl daily, f/u BMP two weeks    Memory deficits Admitted memory lapses, 09/04/14  MMSE 30/30.     Family/ Staff  Communication: observe the patient.   Goals of Care: IL  Labs/tests ordered: BMP 2 weeks.

## 2015-02-19 ENCOUNTER — Non-Acute Institutional Stay: Payer: Medicare Other | Admitting: Nurse Practitioner

## 2015-02-19 ENCOUNTER — Encounter: Payer: Self-pay | Admitting: Nurse Practitioner

## 2015-02-19 DIAGNOSIS — I10 Essential (primary) hypertension: Secondary | ICD-10-CM

## 2015-02-19 DIAGNOSIS — H01003 Unspecified blepharitis right eye, unspecified eyelid: Secondary | ICD-10-CM | POA: Diagnosis not present

## 2015-02-19 DIAGNOSIS — K5900 Constipation, unspecified: Secondary | ICD-10-CM

## 2015-02-19 DIAGNOSIS — F419 Anxiety disorder, unspecified: Secondary | ICD-10-CM | POA: Diagnosis not present

## 2015-02-19 DIAGNOSIS — H01006 Unspecified blepharitis left eye, unspecified eyelid: Secondary | ICD-10-CM | POA: Diagnosis not present

## 2015-02-19 DIAGNOSIS — E876 Hypokalemia: Secondary | ICD-10-CM | POA: Diagnosis not present

## 2015-02-19 NOTE — Assessment & Plan Note (Signed)
Daily prn MiraLax is adequate along with diet control.   

## 2015-02-19 NOTE — Progress Notes (Signed)
Patient ID: Alice Swanson, female   DOB: 21-Jan-1920, 81 y.o.   MRN: [id number]   Code Status: full code  Allergies  Allergen Reactions  . Penicillins     Chief Complaint  Patient presents with  . Medical Management of Chronic Issues  . Acute Visit    hypokalemia    HPI: Patient is a 81 y.o. female seen in the AL at [facility] today for K 3.0 chronic medical conditions.  Problem List Items Addressed This Visit    Anxiety (Chronic)    Stated her appetite is poor, doesn't sleep at night-just watching her clock but doing nothing in bed, appears exhausted, wants to get out here, taking Prozac 20mg  daily, urine culture is unremarkable for UTI, trial of Mirtazapine 7.5mg  nightly  02/05/15 c/o too sleepy-desires to dc Mirtazapine.  02/19/15 no change in her mood, continue Prozac        Hypokalemia - Primary (Chronic)    02/17/15 K 3.0 regardless Kcl 10meq daily-will increase to 40meq daily, update BMP in 2 weeks.       Hypertension    Controlled, takes Losartan 50mg  daily.        Constipation    Daily prn MiraLax is adequate along with diet control.         Blepharitis of both eyes    Much improved.          Review of Systems:  Review of Systems  Constitutional: Negative for fever, chills and diaphoresis.       Generalized  HENT: Positive for hearing loss. Negative for congestion, ear discharge, ear pain, nosebleeds, sore throat and tinnitus.   Eyes: Negative for discharge and redness.  Respiratory: Positive for cough. Negative for shortness of breath, wheezing and stridor.   Cardiovascular: Positive for leg swelling. Negative for chest pain and palpitations.       Trace in ankles.   Gastrointestinal: Negative for nausea, vomiting, abdominal pain, diarrhea, constipation and blood in stool.  Endocrine: Negative for polydipsia.  Genitourinary: Positive for frequency. Negative for dysuria, urgency, hematuria and flank pain.  Musculoskeletal: Positive for gait  problem. Negative for myalgias, back pain and neck pain.       Ambulates with walker. Slow and wobbly. falling  Skin: Negative for rash.  Allergic/Immunologic: Negative for environmental allergies.  Neurological: Positive for weakness. Negative for dizziness, tremors, seizures and headaches.  Hematological: Does not bruise/bleed easily.  Psychiatric/Behavioral: Negative for suicidal ideas and hallucinations. The patient is nervous/anxious.        Expressed depression, not sleep at night, wants to get out of here      Past Medical History  Diagnosis Date  . Arthritis   . Depression   . Glaucoma     both eyes  . Hyperlipidemia   . Hypertension   . Cardiac arrhythmia due to congenital heart disease   . Sciatica   . Anxiety   . Allergic rhinitis   . Macular degeneration of both eyes   . TIA (transient ischemic attack) 08/31/14  . Bradycardia 08/31/14  . Heart block AV first degree 08/31/14  . Memory deficit 08/31/14  . Cerebral atrophy 08/31/14  . Cerebrovascular disease 08/31/14  . Cerebellar infarction 08/31/14  . Osteoarthritis 08/31/14  . Hypokalemia    Past Surgical History  Procedure Laterality Date  . Tonsillectomy and adenoidectomy  1926  . Appendectomy  1932  . Abdominal hysterectomy  1962  . Cataract extraction w/ intraocular lens implant Right 12/2011    Dr.  Pricilla Holm  . Cataract extraction w/ intraocular lens implant Left 02/2013    Dr. Dione Booze  . Colonoscopy  2005   Social History:   reports that she has never smoked. She has never used smokeless tobacco. She reports that she does not drink alcohol or use illicit drugs.  Family History  Problem Relation Age of Onset  . Heart disease Mother     MI  . Stroke Father     Medications: Patient's Medications  New Prescriptions   No medications on file  Previous Medications   ASPIRIN 325 MG TABLET    Take 1 tablet (325 mg total) by mouth daily.   DORZOLAMIDE (TRUSOPT) 2 % OPHTHALMIC SOLUTION    Use one drop in  both eyes at bedtime   ERYTHROMYCIN OPHTHALMIC OINTMENT    Place 1 application into both eyes at bedtime. Injected in lower sac for drainage nightly   FLUOXETINE (PROZAC) 20 MG TABLET    TAKE ONE TABLET BY MOUTH ONCE DAILY   FLUTICASONE (FLONASE) 50 MCG/ACT NASAL SPRAY    Place 1 spray into both nostrils daily.   LORATADINE (CLARITIN) 10 MG TABLET    Take 10 mg by mouth daily as needed for allergies.   LOSARTAN (COZAAR) 50 MG TABLET    TAKE 1 TABLET EVERY DAY TO CONTROL BLOOD PRESSURE   MIRTAZAPINE (REMERON) 7.5 MG TABLET    Take one tablet at bedtime for rest   MULTIPLE VITAMINS-MINERALS (ICAPS) CAPS    Take 1 capsule by mouth 2 (two) times daily.    POLYETHYLENE GLYCOL (MIRALAX / GLYCOLAX) PACKET    Take 17 g by mouth as needed.   TIMOLOL (TIMOPTIC) 0.5 % OPHTHALMIC SOLUTION    One drop in both eyes at bedtime  Modified Medications   No medications on file  Discontinued Medications   No medications on file     Physical Exam: Physical Exam  Constitutional: She is oriented to person, place, and time. She appears well-developed and well-nourished. No distress.  HENT:  Head: Normocephalic and atraumatic.  Right Ear: External ear normal.  Left Ear: External ear normal.  Nose: Nose normal.  Mouth/Throat: Oropharynx is clear and moist. No oropharyngeal exudate.  Eyes: EOM are normal. Pupils are equal, round, and reactive to light. Right eye exhibits no discharge. Left eye exhibits no discharge. Right conjunctiva is injected. No scleral icterus.  Neck: Normal range of motion. Neck supple. No JVD present. No tracheal deviation present. No thyromegaly present.  Cardiovascular: Normal rate, regular rhythm, normal heart sounds and intact distal pulses.   No murmur heard. Pulmonary/Chest: Effort normal and breath sounds normal. No stridor. No respiratory distress. She has no wheezes. She has no rales. She exhibits no tenderness.  Abdominal: Soft. Bowel sounds are normal. She exhibits no  distension. There is no tenderness. There is no rebound and no guarding.  Genitourinary: Vagina normal. Guaiac negative stool. No vaginal discharge found.  External hemorrhoids-stable.   Musculoskeletal: Normal range of motion. She exhibits no edema or tenderness.  Lymphadenopathy:    She has no cervical adenopathy.  Neurological: She is alert and oriented to person, place, and time. She displays normal reflexes. No cranial nerve deficit. She exhibits normal muscle tone. Coordination normal.  Skin: Skin is warm and dry. No rash noted. She is not diaphoretic. No erythema. No pallor.  Psychiatric: Her mood appears anxious. Her speech is not rapid and/or pressured, not delayed, not tangential and not slurred. She is aggressive. She is not agitated, not hyperactive,  not slowed, not withdrawn, not actively hallucinating and not combative. Thought content is not paranoid and not delusional. Cognition and memory are not impaired. She expresses impulsivity. She does not express inappropriate judgment. She exhibits a depressed mood. She expresses no homicidal and no suicidal ideation. She expresses no suicidal plans and no homicidal plans. She is communicative. She exhibits normal recent memory and normal remote memory. She is attentive.    Filed Vitals:   02/19/15 1233  BP: 130/70  Pulse: 70  Temp: 98 F (36.7 C)  TempSrc: Tympanic  Resp: 16      Labs reviewed: Basic Metabolic Panel:  Recent Labs  04/54/09 1404 08/31/14 1426 09/04/14 01/20/15  NA 140 143 142 142  K 3.1* 3.0* 4.0 3.3*  CL 105 107  --   --   CO2 22  --   --   --   GLUCOSE 100* 99  --   --   BUN 22*  CREATININE 0.76 0.70  --  0.9  CALCIUM 9.2  --   --   --   TSH  --   --  1.94  --    Liver Function Tests:  Recent Labs  08/31/14 1404 09/04/14 01/20/15  AST ALT ALKPHOS 93 82 74  BILITOT 0.3  --   --   PROT 7.3  --   --   ALBUMIN 3.2*  --   --    No results for input(s): LIPASE,  AMYLASE in the last 8760 hours. No results for input(s): AMMONIA in the last 8760 hours. CBC:  Recent Labs  08/31/14 1404 08/31/14 1426 01/20/15  WBC 7.8  --  7.0  NEUTROABS 4.4  --   --   HGB 13.4 13.9 13.6  HCT 41.1 41.0 41  MCV 92.6  --   --   PLT 217  --  177   Lipid Panel:  Recent Labs  09/01/14 0445  CHOL 198  HDL 46  LDLCALC 132*  TRIG 101  CHOLHDL 4.3   Anemia Panel: No results for input(s): FOLATE, IRON, VITAMINB12 in the last 8760 hours.  Past Procedures:   08/31/14 CT head w/o contrast:  IMPRESSION: 1. No acute intracranial abnormality. 2. Cerebral atrophy and small vessel ischemic change. 3. Sinus disease.  09/01/14 MRI brain, MRA head  IMPRESSION: No acute infarction. Extensive chronic small vessel ischemic changes throughout the brain as outlined above.  No major vessel intracranial occlusion. Focal stenoses within the A1 and M1 segments on the right. 50% stenosis of the right M1 segment for place the patient at risk of right MCA territory infarction. Diffuse atherosclerotic narrowing and irregularity within the major posterior circulation branch vessels.  09/01/14 echocardiogram: EF 55-60%  Assessment/Plan Hypokalemia 02/17/15 K 3.0 regardless Kcl daily-will increase to daily, update BMP in 2 weeks.    Anxiety Stated her appetite is poor, doesn't sleep at night-just watching her clock but doing nothing in bed, appears exhausted, wants to get out here, taking Prozac  daily, urine culture is unremarkable for UTI, trial of Mirtazapine 7.5mg  nightly  02/05/15 c/o too sleepy-desires to dc Mirtazapine.  02/19/15 no change in her mood, continue Prozac     Hypertension Controlled, takes Losartan  daily.     Blepharitis of both eyes Much improved.    Constipation Daily prn MiraLax is adequate along with diet control.        Family/ Staff Communication: observe the patient.  Goals of Care: AL  Labs/tests  ordered: BMP 2 weeks.

## 2015-02-19 NOTE — Assessment & Plan Note (Signed)
Much improved

## 2015-02-19 NOTE — Assessment & Plan Note (Signed)
Controlled, takes Losartan 50mg  daily.

## 2015-02-19 NOTE — Assessment & Plan Note (Signed)
02/17/15 K 3.0 regardless Kcl 10meq daily-will increase to 40meq daily, update BMP in 2 weeks.

## 2015-02-19 NOTE — Assessment & Plan Note (Signed)
Stated her appetite is poor, doesn't sleep at night-just watching her clock but doing nothing in bed, appears exhausted, wants to get out here, taking Prozac 20mg  daily, urine culture is unremarkable for UTI, trial of Mirtazapine 7.5mg  nightly  02/05/15 c/o too sleepy-desires to dc Mirtazapine.  02/19/15 no change in her mood, continue Prozac

## 2015-02-23 ENCOUNTER — Inpatient Hospital Stay (HOSPITAL_COMMUNITY): Payer: Medicare Other

## 2015-02-23 ENCOUNTER — Inpatient Hospital Stay (HOSPITAL_COMMUNITY)
Admission: EM | Admit: 2015-02-23 | Discharge: 2015-02-27 | DRG: 066 | Disposition: A | Discharge status: Hospice | Payer: Medicare Other | Attending: Internal Medicine | Admitting: Internal Medicine

## 2015-02-23 ENCOUNTER — Emergency Department (HOSPITAL_COMMUNITY): Payer: Medicare Other

## 2015-02-23 ENCOUNTER — Encounter (HOSPITAL_COMMUNITY): Payer: Self-pay | Admitting: Emergency Medicine

## 2015-02-23 ENCOUNTER — Ambulatory Visit (HOSPITAL_COMMUNITY): Payer: Medicare Other

## 2015-02-23 DIAGNOSIS — Z7401 Bed confinement status: Secondary | ICD-10-CM | POA: Diagnosis not present

## 2015-02-23 DIAGNOSIS — F329 Major depressive disorder, single episode, unspecified: Secondary | ICD-10-CM | POA: Diagnosis present

## 2015-02-23 DIAGNOSIS — I252 Old myocardial infarction: Secondary | ICD-10-CM

## 2015-02-23 DIAGNOSIS — I6302 Cerebral infarction due to thrombosis of basilar artery: Principal | ICD-10-CM | POA: Insufficient documentation

## 2015-02-23 DIAGNOSIS — I10 Essential (primary) hypertension: Secondary | ICD-10-CM | POA: Insufficient documentation

## 2015-02-23 DIAGNOSIS — H3530 Unspecified macular degeneration: Secondary | ICD-10-CM | POA: Diagnosis present

## 2015-02-23 DIAGNOSIS — I2510 Atherosclerotic heart disease of native coronary artery without angina pectoris: Secondary | ICD-10-CM | POA: Diagnosis present

## 2015-02-23 DIAGNOSIS — Z66 Do not resuscitate: Secondary | ICD-10-CM | POA: Diagnosis present

## 2015-02-23 DIAGNOSIS — I639 Cerebral infarction, unspecified: Secondary | ICD-10-CM | POA: Insufficient documentation

## 2015-02-23 DIAGNOSIS — Z515 Encounter for palliative care: Secondary | ICD-10-CM | POA: Diagnosis not present

## 2015-02-23 DIAGNOSIS — Z79899 Other long term (current) drug therapy: Secondary | ICD-10-CM

## 2015-02-23 DIAGNOSIS — R4701 Aphasia: Secondary | ICD-10-CM | POA: Diagnosis present

## 2015-02-23 DIAGNOSIS — Z880 Allergy status to penicillin: Secondary | ICD-10-CM

## 2015-02-23 DIAGNOSIS — I679 Cerebrovascular disease, unspecified: Secondary | ICD-10-CM | POA: Diagnosis not present

## 2015-02-23 DIAGNOSIS — E785 Hyperlipidemia, unspecified: Secondary | ICD-10-CM | POA: Insufficient documentation

## 2015-02-23 DIAGNOSIS — H409 Unspecified glaucoma: Secondary | ICD-10-CM | POA: Diagnosis present

## 2015-02-23 DIAGNOSIS — R1314 Dysphagia, pharyngoesophageal phase: Secondary | ICD-10-CM | POA: Insufficient documentation

## 2015-02-23 DIAGNOSIS — R471 Dysarthria and anarthria: Secondary | ICD-10-CM | POA: Insufficient documentation

## 2015-02-23 DIAGNOSIS — Z7982 Long term (current) use of aspirin: Secondary | ICD-10-CM

## 2015-02-23 DIAGNOSIS — Z823 Family history of stroke: Secondary | ICD-10-CM | POA: Diagnosis not present

## 2015-02-23 DIAGNOSIS — I6789 Other cerebrovascular disease: Secondary | ICD-10-CM | POA: Diagnosis not present

## 2015-02-23 LAB — LIPID PANEL
CHOL/HDL RATIO: 4.4 ratio
CHOLESTEROL: 240 mg/dL — AB (ref 0–200)
HDL: 54 mg/dL (ref >40)
LDL CALC: 159 mg/dL — AB (ref 0–99)
Triglycerides: 134 mg/dL (ref <150)
VLDL: 27 mg/dL (ref 0–40)

## 2015-02-23 LAB — COMPREHENSIVE METABOLIC PANEL
ALT: 10 U/L — AB (ref 14–54)
AST: 20 U/L (ref 15–41)
Albumin: 3.5 g/dL (ref 3.5–5.0)
Alkaline Phosphatase: 80 U/L (ref 38–126)
Anion gap: 8 (ref 5–15)
BUN: 19 mg/dL (ref 6–20)
CHLORIDE: 110 mmol/L (ref 101–111)
CO2: 21 mmol/L — AB (ref 22–32)
Calcium: 8.9 mg/dL (ref 8.9–10.3)
Creatinine, Ser: 0.81 mg/dL (ref 0.44–1.00)
GFR calc Af Amer: >60 mL/min (ref >60)
GLUCOSE: 127 mg/dL — AB (ref 70–99)
Potassium: 3.6 mmol/L (ref 3.5–5.1)
SODIUM: 139 mmol/L (ref 135–145)
TOTAL PROTEIN: 6.8 g/dL (ref 6.5–8.1)
Total Bilirubin: 1 mg/dL (ref 0.3–1.2)

## 2015-02-23 LAB — I-STAT CHEM 8, ED
BUN: 21 mg/dL — ABNORMAL HIGH (ref 6–20)
CHLORIDE: 108 mmol/L (ref 101–111)
Calcium, Ion: 1.15 mmol/L (ref 1.13–1.30)
Creatinine, Ser: 0.7 mg/dL (ref 0.44–1.00)
Glucose, Bld: 127 mg/dL — ABNORMAL HIGH (ref 70–99)
HEMATOCRIT: 44 % (ref 36.0–46.0)
HEMOGLOBIN: 15 g/dL (ref 12.0–15.0)
POTASSIUM: 3.6 mmol/L (ref 3.5–5.1)
SODIUM: 143 mmol/L (ref 135–145)
TCO2: 18 mmol/L (ref 0–100)

## 2015-02-23 LAB — DIFFERENTIAL
BASOS PCT: 0 % (ref 0–1)
Basophils Absolute: 0 10*3/uL (ref 0.0–0.1)
EOS PCT: 2 % (ref 0–5)
Eosinophils Absolute: 0.2 10*3/uL (ref 0.0–0.7)
Lymphocytes Relative: 23 % (ref 12–46)
Lymphs Abs: 2.3 10*3/uL (ref 0.7–4.0)
MONO ABS: 0.7 10*3/uL (ref 0.1–1.0)
Monocytes Relative: 7 % (ref 3–12)
Neutro Abs: 6.8 10*3/uL (ref 1.7–7.7)
Neutrophils Relative %: 68 % (ref 43–77)

## 2015-02-23 LAB — URINALYSIS, ROUTINE W REFLEX MICROSCOPIC
Bilirubin Urine: NEGATIVE
Glucose, UA: NEGATIVE mg/dL
Ketones, ur: NEGATIVE mg/dL
Leukocytes, UA: NEGATIVE
NITRITE: NEGATIVE
Protein, ur: 30 mg/dL — AB
SPECIFIC GRAVITY, URINE: 1.016 (ref 1.005–1.030)
Urobilinogen, UA: 0.2 mg/dL (ref 0.0–1.0)
pH: 6 (ref 5.0–8.0)

## 2015-02-23 LAB — CBC
HEMATOCRIT: 42.3 % (ref 36.0–46.0)
Hemoglobin: 14.1 g/dL (ref 12.0–15.0)
MCH: 31.1 pg (ref 26.0–34.0)
MCHC: 33.3 g/dL (ref 30.0–36.0)
MCV: 93.4 fL (ref 78.0–100.0)
Platelets: 179 10*3/uL (ref 150–400)
RBC: 4.53 MIL/uL (ref 3.87–5.11)
RDW: 14.1 % (ref 11.5–15.5)
WBC: 10 10*3/uL (ref 4.0–10.5)

## 2015-02-23 LAB — ETHANOL: Alcohol, Ethyl (B): <5 mg/dL (ref <5)

## 2015-02-23 LAB — URINE MICROSCOPIC-ADD ON

## 2015-02-23 LAB — I-STAT TROPONIN, ED: TROPONIN I, POC: 0.01 ng/mL (ref 0.00–0.08)

## 2015-02-23 LAB — RAPID URINE DRUG SCREEN, HOSP PERFORMED
Amphetamines: NOT DETECTED
BARBITURATES: NOT DETECTED
Benzodiazepines: NOT DETECTED
Cocaine: NOT DETECTED
OPIATES: NOT DETECTED
Tetrahydrocannabinol: NOT DETECTED

## 2015-02-23 LAB — PROTIME-INR
INR: 1.03 (ref 0.00–1.49)
PROTHROMBIN TIME: 13.7 s (ref 11.6–15.2)

## 2015-02-23 LAB — APTT: aPTT: 28 seconds (ref 24–37)

## 2015-02-23 MED ORDER — SODIUM CHLORIDE 0.9 % IV SOLN
INTRAVENOUS | Status: DC
  Administered 2015-02-23 – 2015-02-27 (×5): via INTRAVENOUS

## 2015-02-23 MED ORDER — STROKE: EARLY STAGES OF RECOVERY BOOK
Freq: Once | Status: AC
  Administered 2015-02-23: 1

## 2015-02-23 MED ORDER — ERYTHROMYCIN 5 MG/GM OP OINT
1.0000 "application " | TOPICAL_OINTMENT | Freq: Every day | OPHTHALMIC | Status: DC
  Administered 2015-02-23 – 2015-02-26 (×4): 1 via OPHTHALMIC
  Filled 2015-02-23: qty 3.5

## 2015-02-23 MED ORDER — CLOPIDOGREL BISULFATE 75 MG PO TABS: 75.0000 mg | ORAL_TABLET | Freq: Every day | ORAL | Status: DC

## 2015-02-23 MED ORDER — ASPIRIN 300 MG RE SUPP
300.0000 mg | Freq: Every day | RECTAL | Status: DC
  Administered 2015-02-23 – 2015-02-27 (×6): 300 mg via RECTAL
  Filled 2015-02-23 (×6): qty 1

## 2015-02-23 MED ORDER — HYDRALAZINE HCL 20 MG/ML IJ SOLN
10.0000 mg | Freq: Four times a day (QID) | INTRAMUSCULAR | Status: DC | PRN
  Administered 2015-02-25 – 2015-02-27 (×3): 10 mg via INTRAVENOUS
  Filled 2015-02-23 (×4): qty 1

## 2015-02-23 MED ORDER — HEPARIN SODIUM (PORCINE) 5000 UNIT/ML IJ SOLN
5000.0000 [IU] | Freq: Three times a day (TID) | INTRAMUSCULAR | Status: DC
  Administered 2015-02-23 – 2015-02-27 (×13): 5000 [IU] via SUBCUTANEOUS
  Filled 2015-02-23 (×11): qty 1

## 2015-02-23 MED ORDER — DORZOLAMIDE HCL 2 % OP SOLN
1.0000 [drp] | Freq: Every day | OPHTHALMIC | Status: DC
  Administered 2015-02-23 – 2015-02-26 (×4): 1 [drp] via OPHTHALMIC
  Filled 2015-02-23: qty 10

## 2015-02-23 MED ORDER — TIMOLOL MALEATE 0.5 % OP SOLN
1.0000 [drp] | Freq: Every day | OPHTHALMIC | Status: DC
  Administered 2015-02-23 – 2015-02-26 (×4): 1 [drp] via OPHTHALMIC
  Filled 2015-02-23: qty 5

## 2015-02-23 MED ORDER — ASPIRIN 325 MG PO TABS: 325.0000 mg | ORAL_TABLET | Freq: Every day | ORAL | Status: DC

## 2015-02-23 NOTE — H&P (Signed)
[facility] History and Physical  Alice Swanson EAV:[id number] DOB: 17-Sep-1920 DOA: 02/23/2015  Referring physician: EDP PCP: Kimber Relic, MD   Chief Complaint: Aphasia   HPI: Alice Swanson is a 81 y.o. female h/o cerebrovascular disease with TIA and cerebellar infarction last November, patient presents to the ED with aphasia.  Patient developed symptoms of abnormal gait, weakness, and aphasia 7 hours PTA at about 6:00 PM yesterday evening.  She is typically able to ambulate on her own and speak fluently.  Facility reports abnormal speech as well as urinary incontinence at shift change at midnight.  Review of Systems: Systems reviewed.  As above, otherwise negative  Past Medical History  Diagnosis Date  . Arthritis   . Depression   . Glaucoma     both eyes  . Hyperlipidemia   . Hypertension   . Cardiac arrhythmia due to congenital heart disease   . Sciatica   . Anxiety   . Allergic rhinitis   . Macular degeneration of both eyes   . TIA (transient ischemic attack) 08/31/14  . Bradycardia 08/31/14  . Heart block AV first degree 08/31/14  . Memory deficit 08/31/14  . Cerebral atrophy 08/31/14  . Cerebrovascular disease 08/31/14  . Cerebellar infarction 08/31/14  . Osteoarthritis 08/31/14  . Hypokalemia    Past Surgical History  Procedure Laterality Date  . Tonsillectomy and adenoidectomy  1926  . Appendectomy  1932  . Abdominal hysterectomy  1962  . Cataract extraction w/ intraocular lens implant Right 12/2011    Dr. Pricilla Holm  . Cataract extraction w/ intraocular lens implant Left 02/2013    Dr. Dione Booze  . Colonoscopy  2005   Social History:  reports that she has never smoked. She has never used smokeless tobacco. She reports that she does not drink alcohol or use illicit drugs.  Allergies  Allergen Reactions  . Penicillins Other (See Comments)    On MAR    Family History  Problem Relation Age of Onset  . Heart disease Mother     MI  . Stroke Father      Prior  to Admission medications   Medication Sig Start Date End Date Taking? Authorizing Provider  dorzolamide (TRUSOPT) 2 % ophthalmic solution Use one drop in both eyes at bedtime 01/09/15  Yes Historical Provider, MD  FLUoxetine (PROZAC) 20 MG tablet TAKE ONE TABLET BY MOUTH ONCE DAILY 10/24/14  Yes Tiffany L Reed, DO  losartan (COZAAR) 50 MG tablet TAKE 1 TABLET EVERY DAY TO CONTROL BLOOD PRESSURE 07/31/14  Yes Sharon Seller, NP  Multiple Vitamins-Minerals (ICAPS) CAPS Take 1 capsule by mouth 2 (two) times daily.    Yes Historical Provider, MD  potassium chloride SA (K-DUR,KLOR-CON) 20 MEQ tablet Take 40 mEq by mouth daily.   Yes Historical Provider, MD  timolol (TIMOPTIC) 0.5 % ophthalmic solution One drop in both eyes at bedtime 01/09/15  Yes Historical Provider, MD  aspirin 325 MG tablet Take 1 tablet (325 mg total) by mouth daily. Patient not taking: Reported on 02/23/2015 09/02/14   Henderson Cloud, MD  erythromycin ophthalmic ointment Place 1 application into both eyes at bedtime. Injected in lower sac for drainage nightly Patient not taking: Reported on 02/23/2015 10/30/14   Man Mast X, NP  fluticasone (FLONASE) 50 MCG/ACT nasal spray Place 1 spray into both nostrils daily. Patient not taking: Reported on 02/23/2015 10/30/14   Man Mast X, NP  loratadine (CLARITIN) 10 MG tablet Take 10 mg by mouth daily as needed  for allergies.    Historical Provider, MD  mirtazapine (REMERON) 7.5 MG tablet Take one tablet at bedtime for rest Patient not taking: Reported on 02/23/2015 01/22/15   Man Mast X, NP  polyethylene glycol (MIRALAX / GLYCOLAX) packet Take 17 g by mouth as needed.    Historical Provider, MD   Physical Exam: Filed Vitals:   02/23/15 0440  BP: 180/74  Pulse: 63  Temp: 97.9 F (36.6 C)  Resp: 18    BP 180/74 mmHg  Pulse 63  Temp(Src) 97.9 F (36.6 C) (Oral)  Resp 18  Ht 5' (1.524 m)  Wt 57.4 kg (126 lb 8.7 oz)  BMI 24.71 kg/m2  SpO2 97%  General Appearance:    Alert,  oriented, no distress, appears stated age  Head:    Normocephalic, atraumatic  Eyes:    PERRL, EOMI, sclera non-icteric        Nose:   Nares without drainage or epistaxis. Mucosa, turbinates normal  Throat:   Moist mucous membranes. Oropharynx without erythema or exudate.  Neck:   Supple. No carotid bruits.  No thyromegaly.  No lymphadenopathy.   Back:     No CVA tenderness, no spinal tenderness  Lungs:     Clear to auscultation bilaterally, without wheezes, rhonchi or rales  Chest wall:    No tenderness to palpitation  Heart:    Regular rate and rhythm without murmurs, gallops, rubs  Abdomen:     Soft, non-tender, nondistended, normal bowel sounds, no organomegaly  Genitalia:    deferred  Rectal:    deferred  Extremities:   No clubbing, cyanosis or edema.  Pulses:   2+ and symmetric all extremities  Skin:   Skin color, texture, turgor normal, no rashes or lesions  Lymph nodes:   Cervical, supraclavicular, and axillary nodes normal  Neurologic:   Patient demonstrating difficulty with speech, suspicious for a Broca's aphasia, does indicate she understands everything we are saying so mostly an expressive aphasia.  No loss of strength in extremities.    Labs on Admission:  Basic Metabolic Panel:  Recent Labs Lab 02/23/15 0121 02/23/15 0125  NA 139 143  K 3.6 3.6  CL 110 108  CO2 21*  --   GLUCOSE 127* 127*  BUN 19 21*  CREATININE 0.81 0.70  CALCIUM 8.9  --    Liver Function Tests:  Recent Labs Lab 02/23/15 0121  AST 20  ALT 10*  ALKPHOS 80  BILITOT 1.0  PROT 6.8  ALBUMIN 3.5   No results for input(s): LIPASE, AMYLASE in the last 168 hours. No results for input(s): AMMONIA in the last 168 hours. CBC:  Recent Labs Lab 02/23/15 0121 02/23/15 0125  WBC 10.0  --   NEUTROABS 6.8  --   HGB 14.1 15.0  HCT 42.3 44.0  MCV 93.4  --   PLT 179  --    Cardiac Enzymes: No results for input(s): CKTOTAL, CKMB, CKMBINDEX, TROPONINI in the last 168 hours.  BNP (last 3  results) No results for input(s): PROBNP in the last 8760 hours. CBG: No results for input(s): GLUCAP in the last 168 hours.  Radiological Exams on Admission: Ct Head Wo Contrast  02/23/2015   CLINICAL DATA:  Bilateral lower extremity weakness  EXAM: CT HEAD WITHOUT CONTRAST  TECHNIQUE: Contiguous axial images were obtained from the base of the skull through the vertex without intravenous contrast.  COMPARISON:  09/01/2014, 08/31/2014.  FINDINGS: There is no intracranial hemorrhage, mass or evidence of acute infarction. There  is no extra-axial fluid collection. There is moderately severe generalized atrophy and hemispheric hypodensity consistent with chronic small vessel ischemic disease. There is prior lacunar infarction in the left caudate head. No acute bony abnormalities are evident. Incidentally noted right frontal sinus osteoma.  IMPRESSION: Moderately severe generalized atrophy and chronic small vessel ischemic disease. No acute findings.   Electronically Signed   By: Ellery Plunkaniel R Mitchell M.D.   On: 02/23/2015 01:35    EKG: Independently reviewed.  Assessment/Plan Principal Problem:   Aphasia Active Problems:   Cerebrovascular disease   1. Aphasia - worrisome for ischemic stroke especially in setting of known cerebrovascular disease and prior cerebellar stroke. 1. Stroke pathway 2. MRI/MRA 3. 2d echo 4. Carotid dopplers 5. ASA for anticoagulation till neurology states otherwise 6. Neurology going to see patient, please see Dr. Cheral Markeramillo's note 7. PT/OT/SLP 8. NPO as she failed swallow in ED.    Code Status: Full Code  Family Communication: No family in room Disposition Plan: Admit to inpatient   Time spent: 70 min  GARDNER, JARED M. [facility] Pager [phone]  If 7AM-7PM, please contact the day team taking care of the patient [email or web address] Password [email or web address] 02/23/2015, 4:55 AM

## 2015-02-23 NOTE — Consult Note (Signed)
Referring Physician: Dr. Roxanne Mins   Chief Complaint: aphasia  HPI:                                                                                                                                         Alice Swanson is an 81 y.o. female with a past medical history significant for HTN, hyperlipidemia, MI, TIA, ischemic cerebellar infarct 11/15, OA, brought in for further evaluation of language impairment. Patient is [facility] and at baseline ambulates with a walker. Her last known well is not really known, but apparently patient developed bilateral legs weakness and language impaired that motivated calling EMS due to concern for stroke. Patient said that she is aware of having trouble taking and " my right leg getting weaker" since probably yesterday. She denies HA, vertigo, double vision, difficulty swallowing, falls, or vision impairment. CT brain was personally reviewed and showed no acute abnormality.  Date last known well: uncertain Time last known well: uncertain tPA Given: no, late presentation   Past Medical History  Diagnosis Date  . Arthritis   . Depression   . Glaucoma     both eyes  . Hyperlipidemia   . Hypertension   . Cardiac arrhythmia due to congenital heart disease   . Sciatica   . Anxiety   . Allergic rhinitis   . Macular degeneration of both eyes   . TIA (transient ischemic attack) 08/31/14  . Bradycardia 08/31/14  . Heart block AV first degree 08/31/14  . Memory deficit 08/31/14  . Cerebral atrophy 08/31/14  . Cerebrovascular disease 08/31/14  . Cerebellar infarction 08/31/14  . Osteoarthritis 08/31/14  . Hypokalemia     Past Surgical History  Procedure Laterality Date  . Tonsillectomy and adenoidectomy  1926  . Appendectomy  1932  . Abdominal hysterectomy  1962  . Cataract extraction w/ intraocular lens implant Right 12/2011    Dr. Berline Lopes  . Cataract extraction w/ intraocular lens implant Left 02/2013    Dr. Katy Fitch  . Colonoscopy  2005    Family  History  Problem Relation Age of Onset  . Heart disease Mother     MI  . Stroke Father    Social History:  reports that she has never smoked. She has never used smokeless tobacco. She reports that she does not drink alcohol or use illicit drugs. Family history: said she can not recall Allergies:  Allergies  Allergen Reactions  . Penicillins Other (See Comments)    On MAR    Medications:  I have reviewed the patient's current medications.  ROS:                                                                                                                                       History obtained from chart review and the patient  General ROS: negative for - fever, night sweats, weight gain or weight loss Psychological ROS: negative for - behavioral disorder, hallucinations, mood swings or suicidal ideation Ophthalmic ROS: negative for - blurry vision, double vision, eye pain or loss of vision ENT ROS: negative for - epistaxis, nasal discharge, oral lesions, sore throat, tinnitus or vertigo Allergy and Immunology ROS: negative for - hives or itchy/watery eyes Hematological and Lymphatic ROS: negative for - bleeding problems, bruising or swollen lymph nodes Endocrine ROS: negative for - galactorrhea, hair pattern changes, polydipsia/polyuria or temperature intolerance Respiratory ROS: negative for - cough, hemoptysis, shortness of breath or wheezing Cardiovascular ROS: negative for - chest pain, dyspnea on exertion, edema or irregular heartbeat Gastrointestinal ROS: negative for - abdominal pain, diarrhea, hematemesis, nausea/vomiting or stool incontinence Genito-Urinary ROS: negative for - dysuria, hematuria, incontinence or urinary frequency/urgency Musculoskeletal ROS: negative for - joint swelling  Neurological ROS: as noted in HPI Dermatological ROS: negative for  rash and skin lesion changes    Physical exam: pleasant female in no apparent distress. Blood pressure 180/74, pulse 63, temperature 97.9 F (36.6 C), temperature source Oral, resp. rate 18, height 5' (1.524 m), weight 57.4 kg (126 lb 8.7 oz), SpO2 97 %. Head: normocephalic. Neck: supple, no bruits, no JVD. Cardiac: no murmurs. Lungs: clear. Abdomen: soft, no tender, no mass. Extremities: no edema. Skin: no rash  Neurologic Examination:                                                                                                      General: Mental Status: Alert, oriented, thought content appropriate. Comprehension, naming, repetition are intact. Marked dysarthria.   Cranial Nerves: II: Discs flat bilaterally; Visual fields grossly normal, pupils equal, round, reactive to light and accommodation III,IV, VI: ptosis not present, extra-ocular motions intact bilaterally V,VII: smile symmetric, facial light touch sensation normal bilaterally VIII: hearing normal bilaterally IX,X: uvula rises symmetrically XI: bilateral shoulder shrug XII: midline tongue extension without atrophy or fasciculations Motor: Moves all limbs symmetrically Tone and bulk:normal tone throughout; no atrophy noted Sensory: Pinprick and light touch intact throughout, bilaterally Deep Tendon Reflexes:  1+ all over. Plantars: Right: downgoing   Left: downgoing Cerebellar: normal  finger-to-nose,  normal heel-to-shin test Gait:  Unable to test due to multiple leads    Results for orders placed or performed during the hospital encounter of 02/23/15 (from the past 48 hour(s))  Ethanol     Status: None   Collection Time: 02/23/15  1:21 AM  Result Value Ref Range   Alcohol, Ethyl (B) <5 <5 mg/dL    Comment:        LOWEST DETECTABLE LIMIT FOR SERUM ALCOHOL IS 11 mg/dL FOR MEDICAL PURPOSES ONLY   Protime-INR     Status: None   Collection Time: 02/23/15  1:21 AM  Result Value Ref Range   Prothrombin Time  13.7 11.6 - 15.2 seconds   INR 1.03 0.00 - 1.49  APTT     Status: None   Collection Time: 02/23/15  1:21 AM  Result Value Ref Range   aPTT 28 24 - 37 seconds  CBC     Status: None   Collection Time: 02/23/15  1:21 AM  Result Value Ref Range   WBC 10.0 4.0 - 10.5 K/uL    Comment: WHITE COUNT CONFIRMED ON SMEAR   RBC 4.53 3.87 - 5.11 MIL/uL   Hemoglobin 14.1 12.0 - 15.0 g/dL   HCT 42.3 36.0 - 46.0 %   MCV 93.4 78.0 - 100.0 fL   MCH 31.1 26.0 - 34.0 pg   MCHC 33.3 30.0 - 36.0 g/dL   RDW 14.1 11.5 - 15.5 %   Platelets 179 150 - 400 K/uL  Differential     Status: None   Collection Time: 02/23/15  1:21 AM  Result Value Ref Range   Neutrophils Relative % 68 43 - 77 %   Lymphocytes Relative 23 12 - 46 %   Monocytes Relative 7 3 - 12 %   Eosinophils Relative 2 0 - 5 %   Basophils Relative 0 0 - 1 %   Neutro Abs 6.8 1.7 - 7.7 K/uL   Lymphs Abs 2.3 0.7 - 4.0 K/uL   Monocytes Absolute 0.7 0.1 - 1.0 K/uL   Eosinophils Absolute 0.2 0.0 - 0.7 K/uL   Basophils Absolute 0.0 0.0 - 0.1 K/uL   RBC Morphology ELLIPTOCYTES   Comprehensive metabolic panel     Status: Abnormal   Collection Time: 02/23/15  1:21 AM  Result Value Ref Range   Sodium 139 135 - 145 mmol/L   Potassium 3.6 3.5 - 5.1 mmol/L   Chloride 110 101 - 111 mmol/L   CO2 21 (L) 22 - 32 mmol/L   Glucose, Bld 127 (H) 70 - 99 mg/dL   BUN 19 6 - 20 mg/dL   Creatinine, Ser 0.81 0.44 - 1.00 mg/dL   Calcium 8.9 8.9 - 10.3 mg/dL   Total Protein 6.8 6.5 - 8.1 g/dL   Albumin 3.5 3.5 - 5.0 g/dL   AST 20 15 - 41 U/L   ALT 10 (L) 14 - 54 U/L   Alkaline Phosphatase 80 38 - 126 U/L   Total Bilirubin 1.0 0.3 - 1.2 mg/dL   GFR calc non Af Amer >60 >60 mL/min   GFR calc Af Amer >60 >60 mL/min    Comment: (NOTE) The eGFR has been calculated using the CKD EPI equation. This calculation has not been validated in all clinical situations. eGFR's persistently <60 mL/min signify possible Chronic Kidney Disease.    Anion gap 8 5 - 15  I-stat  troponin, ED (not at [facility], [facility])     Status: None   Collection Time: 02/23/15  1:23 AM  Result Value Ref Range   Troponin i, poc 0.01 0.00 - 0.08 ng/mL   Comment 3            Comment: Due to the release kinetics of cTnI, a negative result within the first hours of the onset of symptoms does not rule out myocardial infarction with certainty. If myocardial infarction is still suspected, repeat the test at appropriate intervals.   I-Stat Chem 8, ED  (not at [facility], [facility])     Status: Abnormal   Collection Time: 02/23/15  1:25 AM  Result Value Ref Range   Sodium 143 135 - 145 mmol/L   Potassium 3.6 3.5 - 5.1 mmol/L   Chloride 108 101 - 111 mmol/L   BUN 21 (H) 6 - 20 mg/dL   Creatinine, Ser 0.70 0.44 - 1.00 mg/dL   Glucose, Bld 127 (H) 70 - 99 mg/dL   Calcium, Ion 1.15 1.13 - 1.30 mmol/L   TCO2 18 0 - 100 mmol/L   Hemoglobin 15.0 12.0 - 15.0 g/dL   HCT 44.0 36.0 - 46.0 %  Urine Drug Screen     Status: None   Collection Time: 02/23/15  2:29 AM  Result Value Ref Range   Opiates NONE DETECTED NONE DETECTED   Cocaine NONE DETECTED NONE DETECTED   Benzodiazepines NONE DETECTED NONE DETECTED   Amphetamines NONE DETECTED NONE DETECTED   Tetrahydrocannabinol NONE DETECTED NONE DETECTED   Barbiturates NONE DETECTED NONE DETECTED    Comment:        DRUG SCREEN FOR MEDICAL PURPOSES ONLY.  IF CONFIRMATION IS NEEDED FOR ANY PURPOSE, NOTIFY LAB WITHIN 5 DAYS.        LOWEST DETECTABLE LIMITS FOR URINE DRUG SCREEN Drug Class       Cutoff (ng/mL) Amphetamine      1000 Barbiturate      200 Benzodiazepine   443 Tricyclics       154 Opiates          300 Cocaine          300 THC              50   Urinalysis, Routine w reflex microscopic     Status: Abnormal   Collection Time: 02/23/15  2:29 AM  Result Value Ref Range   Color, Urine YELLOW YELLOW   APPearance CLEAR CLEAR   Specific Gravity, Urine 1.016 1.005 - 1.030   pH 6.0 5.0 - 8.0   Glucose, UA NEGATIVE NEGATIVE mg/dL   Hgb urine  dipstick MODERATE (A) NEGATIVE   Bilirubin Urine NEGATIVE NEGATIVE   Ketones, ur NEGATIVE NEGATIVE mg/dL   Protein, ur 30 (A) NEGATIVE mg/dL   Urobilinogen, UA 0.2 0.0 - 1.0 mg/dL   Nitrite NEGATIVE NEGATIVE   Leukocytes, UA NEGATIVE NEGATIVE  Urine microscopic-add on     Status: None   Collection Time: 02/23/15  2:29 AM  Result Value Ref Range   RBC / HPF 7-10 <3 RBC/hpf   Ct Head Wo Contrast  02/23/2015   CLINICAL DATA:  Bilateral lower extremity weakness  EXAM: CT HEAD WITHOUT CONTRAST  TECHNIQUE: Contiguous axial images were obtained from the base of the skull through the vertex without intravenous contrast.  COMPARISON:  09/01/2014, 08/31/2014.  FINDINGS: There is no intracranial hemorrhage, mass or evidence of acute infarction. There is no extra-axial fluid collection. There is moderately severe generalized atrophy and hemispheric hypodensity consistent with chronic small vessel ischemic disease. There is prior lacunar infarction in the left  caudate head. No acute bony abnormalities are evident. Incidentally noted right frontal sinus osteoma.  IMPRESSION: Moderately severe generalized atrophy and chronic small vessel ischemic disease. No acute findings.   Electronically Signed   By: Andreas Newport M.D.   On: 02/23/2015 01:35    Assessment: 81 y.o. female presents with marked dysarthria and legs weakness. Out of the window for thrombolytic therapy. On exam she has no frank weakness but significant dysarthria. CT brain without acute abnormality. Concern for new ischemic stroke. Admit to medicine. Will be in favor of a limited stroke work up given patient's age. Aspirin after passing swallowing evaluation. Stroke team will resume care tomorrow.  Stroke Risk Factors - age, HTN, hyperlipidemia, CAD, prior stroke  Dorian Pod, MD [facility] [phone]  02/23/2015, 5:44 AM

## 2015-02-23 NOTE — ED Notes (Signed)
Pt arrives via EMS with stroke like symptoms from [facility], pt has expressive aphasia, weakness in bilateral legs and slurred speech. Unclear last known well, per EMS NSF nnoted slurred speech at 0030. Gait changes noted yesterday at 1800.

## 2015-02-23 NOTE — ED Provider Notes (Signed)
CSN: [id number]     Arrival date & time 02/23/15  0113 History   None    This chart was scribed for Edsel Petrin, DO by Arlan Organ, ED Scribe. This patient was seen in room 4N14C/4N14C-01 and the patient's care was started 1:16 AM.   Chief Complaint  Patient presents with  . Stroke Symptoms   The history is provided by the patient. No language interpreter was used.    HPI Comments: Alice Swanson [facility] is a 81 y.o. female with a PMHx of hyperlipidemia, HTN, TIA, cerebrovascular disease, and osteoarthritis who presents after being found to have trouble difficulty speaking and walking.  Per EMS, daughter reported abnormal gait and weakness after eating dinner with her daughter at approximately 6:00 PM yesterday evening (7 hours PTA). Typically at baseline, pt is able to ambulate on her own and speaks fluently. Facility then reported abnormal speech along with urinary incontinence at shift change ~0000. No recent illness reported. Pt with known allergy to Penicillins.  Level 5 caveat for aphasia  Past Medical History  Diagnosis Date  . Arthritis   . Depression   . Glaucoma     both eyes  . Hyperlipidemia   . Hypertension   . Cardiac arrhythmia due to congenital heart disease   . Sciatica   . Anxiety   . Allergic rhinitis   . Macular degeneration of both eyes   . TIA (transient ischemic attack) 08/31/14  . Bradycardia 08/31/14  . Heart block AV first degree 08/31/14  . Memory deficit 08/31/14  . Cerebral atrophy 08/31/14  . Cerebrovascular disease 08/31/14  . Cerebellar infarction 08/31/14  . Osteoarthritis 08/31/14  . Hypokalemia    Past Surgical History  Procedure Laterality Date  . Tonsillectomy and adenoidectomy  1926  . Appendectomy  1932  . Abdominal hysterectomy  1962  . Cataract extraction w/ intraocular lens implant Right 12/2011    Dr. Pricilla Holm  . Cataract extraction w/ intraocular lens implant Left 02/2013    Dr. Dione Booze  .  Colonoscopy  2005   Family History  Problem Relation Age of Onset  . Heart disease Mother     MI  . Stroke Father    History  Substance Use Topics  . Smoking status: Never Smoker   . Smokeless tobacco: Never Used  . Alcohol Use: No   OB History    No data available     Review of Systems  Unable to perform ROS: Other  Constitutional: Negative for fever and chills.  Neurological: Positive for weakness.      Allergies  Penicillins  Home Medications   Prior to Admission medications   Medication Sig Start Date End Date Taking? Authorizing Provider  dorzolamide (TRUSOPT) 2 % ophthalmic solution Use one drop in both eyes at bedtime 01/09/15  Yes Historical Provider, MD  FLUoxetine (PROZAC) 20 MG tablet TAKE ONE TABLET BY MOUTH ONCE DAILY 10/24/14  Yes Tiffany L Reed, DO  losartan (COZAAR) 50 MG tablet TAKE 1 TABLET EVERY DAY TO CONTROL BLOOD PRESSURE 07/31/14  Yes Np, NP  Multiple Vitamins-Minerals (ICAPS) CAPS Take 1 capsule by mouth 2 (two) times daily.    Yes Historical Provider, MD  potassium chloride SA (K-DUR,KLOR-CON) 20 MEQ tablet Take 40 mEq by mouth daily.   Yes Historical Provider, MD  timolol (TIMOPTIC) 0.5 % ophthalmic solution One drop in both eyes at bedtime 01/09/15  Yes Historical Provider, MD  aspirin 325 MG tablet Take 1 tablet (325  mg total) by mouth daily. Patient not taking: Reported on 02/23/2015 09/02/14   Henderson CloudEstela Y Hernandez Acosta, MD  erythromycin ophthalmic ointment Place 1 application into both eyes at bedtime. Injected in lower sac for drainage nightly Patient not taking: Reported on 02/23/2015 10/30/14   Np, NP  fluticasone (FLONASE) 50 MCG/ACT nasal spray Place 1 spray into both nostrils daily. Patient not taking: Reported on 02/23/2015 10/30/14   Np, NP  loratadine (CLARITIN) 10 MG tablet Take 10 mg by mouth daily as needed for allergies.    Historical Provider, MD  mirtazapine (REMERON) 7.5 MG tablet Take one tablet at bedtime for rest Patient not taking:  Reported on 02/23/2015 01/22/15   Np, NP  polyethylene glycol (MIRALAX / GLYCOLAX) packet Take 17 g by mouth as needed.    Historical Provider, MD   Triage Vitals: BP 168/72 mmHg  Pulse 56  Temp(Src) 98.1 F (36.7 C) (Axillary)  Resp 16  Ht 5' (1.524 m)  Wt 126 lb 8.7 oz (57.4 kg)  BMI 24.71 kg/m2  SpO2 95%   Physical Exam  Constitutional: She appears well-developed and well-nourished.  HENT:  Head: Normocephalic and atraumatic.  Right Ear: External ear normal.  Left Ear: External ear normal.  Eyes: Conjunctivae and EOM are normal. Pupils are equal, round, and reactive to light.  Neck: Normal range of motion. Neck supple.  Cardiovascular: Normal rate, regular rhythm, normal heart sounds and intact distal pulses.   Pulmonary/Chest: Effort normal and breath sounds normal.  Abdominal: Soft. Bowel sounds are normal. There is no tenderness.  Musculoskeletal: Normal range of motion.  Neurological: She is alert. She has normal strength. No cranial nerve deficit or sensory deficit. GCS eye subscore is 4. GCS verbal subscore is 5. GCS motor subscore is 6.  Skin: Skin is warm and dry.  Vitals reviewed.   ED Course  Procedures (including critical care time)  DIAGNOSTIC STUDIES: Oxygen Saturation is 99% on RA, Normal by my interpretation.    COORDINATION OF CARE: 1:15 AM- Will order i-stat, ethanol, protime-INR, APTT, CBC, CMP, i-stat troponin, urine drug screen, urinalysis, CT head without contrast, and EKG. Discussed treatment plan with pt at bedside and pt agreed to plan.     Labs Review Labs Reviewed  COMPREHENSIVE METABOLIC PANEL - Abnormal; Notable for the following:    CO2 21 (*)    Glucose, Bld 127 (*)    ALT 10 (*)    All other components within normal limits  URINALYSIS, ROUTINE W REFLEX MICROSCOPIC - Abnormal; Notable for the following:    Hgb urine dipstick MODERATE (*)    Protein, ur 30 (*)    All other components within normal limits  LIPID PANEL - Abnormal; Notable  for the following:    Cholesterol 240 (*)    LDL Cholesterol 159 (*)    All other components within normal limits  I-STAT CHEM 8, ED - Abnormal; Notable for the following:    BUN 21 (*)    Glucose, Bld 127 (*)    All other components within normal limits  ETHANOL  PROTIME-INR  APTT  CBC  DIFFERENTIAL  URINE RAPID DRUG SCREEN (HOSP PERFORMED)  URINE MICROSCOPIC-ADD ON  HEMOGLOBIN A1C  CBC  BASIC METABOLIC PANEL  I-STAT TROPOININ, ED  POCT I-STAT, CHEM 8    Imaging Review Mr Brain Wo Contrast  02/23/2015   CLINICAL DATA:  Initial evaluation for acute aphasia, dysarthria. Gait instability.  EXAM: MRI HEAD WITHOUT CONTRAST  MRA HEAD WITHOUT CONTRAST  TECHNIQUE:  Multiplanar, multiecho pulse sequences of the brain and surrounding structures were obtained without intravenous contrast. Angiographic images of the head were obtained using MRA technique without contrast.  COMPARISON:  Prior CT from earlier the same day as well as previous MRI from 09/01/2014.  FINDINGS: MRI HEAD FINDINGS  Diffuse prominence of the CSF containing spaces is compatible with generalized cerebral atrophy. Extensive patchy and confluent T2/FLAIR hyperintensity within the periventricular and deep white matter both cerebral hemispheres again seen, most compatible with advanced chronic microvascular ischemic disease. Similar changes seen within the pons.  There are patchy multi focal ischemic infarcts involving the central pons, with some extension inferiorly towards the upper brainstem (series 3, image 15). Additional multi focal subcentimeter ischemic infarcts involving the right cerebellar hemisphere (series 3, image 13). A in 7 mm mildly hyperintense focus of restricted diffusion within the posterior right middle cerebellar peduncle appears more subacute in nature (series 3, image 11). Additional patchy subcentimeter ischemic infarcts within the medial left occipital lobe as well as the mid and left aspect of the splenium in  the left PCA territory (series 3, image 24 image 18, 24). There is question of small subacute ischemic infarct within the left cerebellar hemisphere as well (series 3, image 9), as well as the left thalamus (series 3, image 25). There is question of tiny associated petechial hemorrhage within the central pons (series 9, image 8). No hemorrhagic transformation. No other chronic or acute intracranial hemorrhage. Absent flow void within the distal vertebral arteries and proximal -mid basilar artery.  No mass lesion or midline shift.  No extra-axial fluid collection.  Craniocervical junction within normal limits. Scattered multilevel degenerative changes present within the visualized upper cervical spine. Pituitary gland normal.  No acute abnormality about the orbits.  Scattered mild mucoperiosteal thickening present within the ethmoidal air cells and maxillary sinuses. No mastoid effusion. Inner ear structures normal.  Bone marrow signal intensity normal. Scalp soft tissues unremarkable.  MRA HEAD FINDINGS  ANTERIOR CIRCULATION:  Visualized distal cervical segments of the internal carotid arteries are patent with antegrade flow. The petrous, cavernous, and supra clinoid segments are patent bilaterally. A1 segments are patent but demonstrate multi focal atheromatous irregularity. Multi focal air atheromatous irregularity present within the anterior cerebral arteries is well which are opacified to their distal aspects. There are superimposed moderate to severe multi focal stenoses within the left A2 segment.  M1 segments irregular but patent. Focal moderate stenosis within the right M1 segment noted. Irregular somewhat more prevalent within the right M1 segment. Distal MCA branches well opacified.  POSTERIOR CIRCULATION:  Flow was seen within the distal vertebral arteries within the upper neck. There is essentially absent flow within the distal V4 segments bilaterally as they course into the cranial vault. Posterior  inferior cerebral arteries not well opacified. There is little to no flow within the basilar artery as well. Posterior cerebral arteries not definitely visualized. Superior cerebellar arteries not visualized either. This is new relative to most recent MRA from 09/01/2014.  No aneurysm.  IMPRESSION: MRI HEAD IMPRESSION:  1. Patchy multi focal ischemic infarcts within the pons extending inferiorly towards the brainstem, with additional subcentimeter infarcts involving the right cerebellar hemisphere, and left PCA territory. There are probable more subacute infarcts within the right cerebellar hemisphere as well as the left cerebellar hemisphere and left thalamus. No significant mass effect. 2. Advanced cerebral atrophy with chronic microvascular ischemic disease.  MRA HEAD IMPRESSION:  1. Essentially absent flow within the distal vertebral arteries as they  course into the cranial vault with absent flow within the basilar artery and its branches. This is new relative to prior study. 2. Patent anterior circulation. There are moderate to severe multi focal atheromatous stenoses within the left A2 segment. Moderate short-segment stenosis within the mid right M1 segment. Multi focal atheromatous irregularity within the distal MCA branches. Results were called by telephone at the time of interpretation on 02/23/2015 at 6:30 am to Dr. Lyda PeroneJARED GARDNER , who verbally acknowledged these results.   Electronically Signed   By: Rise MuBenjamin  McClintock M.D.   On: 02/23/2015 06:42   Mr Maxine GlennMra Head/brain Wo Cm  02/23/2015   CLINICAL DATA:  Initial evaluation for acute aphasia, dysarthria. Gait instability.  EXAM: MRI HEAD WITHOUT CONTRAST  MRA HEAD WITHOUT CONTRAST  TECHNIQUE: Multiplanar, multiecho pulse sequences of the brain and surrounding structures were obtained without intravenous contrast. Angiographic images of the head were obtained using MRA technique without contrast.  COMPARISON:  Prior CT from earlier the same day as well as  previous MRI from 09/01/2014.  FINDINGS: MRI HEAD FINDINGS  Diffuse prominence of the CSF containing spaces is compatible with generalized cerebral atrophy. Extensive patchy and confluent T2/FLAIR hyperintensity within the periventricular and deep white matter both cerebral hemispheres again seen, most compatible with advanced chronic microvascular ischemic disease. Similar changes seen within the pons.  There are patchy multi focal ischemic infarcts involving the central pons, with some extension inferiorly towards the upper brainstem (series 3, image 15). Additional multi focal subcentimeter ischemic infarcts involving the right cerebellar hemisphere (series 3, image 13). A in 7 mm mildly hyperintense focus of restricted diffusion within the posterior right middle cerebellar peduncle appears more subacute in nature (series 3, image 11). Additional patchy subcentimeter ischemic infarcts within the medial left occipital lobe as well as the mid and left aspect of the splenium in the left PCA territory (series 3, image 24 image 18, 24). There is question of small subacute ischemic infarct within the left cerebellar hemisphere as well (series 3, image 9), as well as the left thalamus (series 3, image 25). There is question of tiny associated petechial hemorrhage within the central pons (series 9, image 8). No hemorrhagic transformation. No other chronic or acute intracranial hemorrhage. Absent flow void within the distal vertebral arteries and proximal -mid basilar artery.  No mass lesion or midline shift.  No extra-axial fluid collection.  Craniocervical junction within normal limits. Scattered multilevel degenerative changes present within the visualized upper cervical spine. Pituitary gland normal.  No acute abnormality about the orbits.  Scattered mild mucoperiosteal thickening present within the ethmoidal air cells and maxillary sinuses. No mastoid effusion. Inner ear structures normal.  Bone marrow signal  intensity normal. Scalp soft tissues unremarkable.  MRA HEAD FINDINGS  ANTERIOR CIRCULATION:  Visualized distal cervical segments of the internal carotid arteries are patent with antegrade flow. The petrous, cavernous, and supra clinoid segments are patent bilaterally. A1 segments are patent but demonstrate multi focal atheromatous irregularity. Multi focal air atheromatous irregularity present within the anterior cerebral arteries is well which are opacified to their distal aspects. There are superimposed moderate to severe multi focal stenoses within the left A2 segment.  M1 segments irregular but patent. Focal moderate stenosis within the right M1 segment noted. Irregular somewhat more prevalent within the right M1 segment. Distal MCA branches well opacified.  POSTERIOR CIRCULATION:  Flow was seen within the distal vertebral arteries within the upper neck. There is essentially absent flow within the distal V4 segments bilaterally  as they course into the cranial vault. Posterior inferior cerebral arteries not well opacified. There is little to no flow within the basilar artery as well. Posterior cerebral arteries not definitely visualized. Superior cerebellar arteries not visualized either. This is new relative to most recent MRA from 09/01/2014.  No aneurysm.  IMPRESSION: MRI HEAD IMPRESSION:  1. Patchy multi focal ischemic infarcts within the pons extending inferiorly towards the brainstem, with additional subcentimeter infarcts involving the right cerebellar hemisphere, and left PCA territory. There are probable more subacute infarcts within the right cerebellar hemisphere as well as the left cerebellar hemisphere and left thalamus. No significant mass effect. 2. Advanced cerebral atrophy with chronic microvascular ischemic disease.  MRA HEAD IMPRESSION:  1. Essentially absent flow within the distal vertebral arteries as they course into the cranial vault with absent flow within the basilar artery and its  branches. This is new relative to prior study. 2. Patent anterior circulation. There are moderate to severe multi focal atheromatous stenoses within the left A2 segment. Moderate short-segment stenosis within the mid right M1 segment. Multi focal atheromatous irregularity within the distal MCA branches. Results were called by telephone at the time of interpretation on 02/23/2015 at 6:30 am to Dr. Lyda Perone , who verbally acknowledged these results.   Electronically Signed   By: Rise Mu M.D.   On: 02/23/2015 06:42     EKG Interpretation   Date/Time:  Monday Feb 23 2015 01:41:14 EDT Ventricular Rate:  69 PR Interval:  216 QRS Duration: 87 QT Interval:  447 QTC Calculation: 479 R Axis:   53 Text Interpretation:  Sinus rhythm Atrial premature complexes Borderline  prolonged PR interval Anteroseptal infarct, old Borderline repolarization  abnormality No significant change since last tracing Confirmed by Mirian Mo [phone]) on 02/23/2015 2:31:32 AM      MDM   Final diagnoses:  Dysarthria    81 y.o. female with pertinent PMH of prior CVA, 1st degree HB presents with aphasia, ataxia. On arrival I examined the pt prior to bedding to discover that symptoms began >6 hours PTA, so no code stroke called.  I did speak with neurology who agreed.  Physical exam without focal deficit, but with slurred speech and incoordination.  CT head unremarkable.   Wu unremarkable.  Differential includes posterior CVA vs encephalopthy.  Neurology recommended admission and further wu.  Consulted medicine for admission I have reviewed all laboratory and imaging studies if ordered as above  1. Dysarthria           Mirian Mo, MD 02/25/15 [id number]

## 2015-02-23 NOTE — Progress Notes (Signed)
[facility]                                                                              Patient Demographics  Alice Swanson, is a 81 y.o. female, DOB - 1920-10-08, WUJ:[id number]  Admit date - 02/23/2015   Admitting Physician Hillary Bow, DO  Outpatient Primary MD for the patient is GREEN, Lenon Curt, MD  LOS - 0   Chief Complaint  Patient presents with  . Stroke Symptoms     HPI on 02/23/2015 by Dr. Lyda Perone Alice Swanson is a 81 y.o. female h/o cerebrovascular disease with TIA and cerebellar infarction last November, patient presents to the ED with aphasia. Patient developed symptoms of abnormal gait, weakness, and aphasia 7 hours PTA at about 6:00 PM yesterday evening. She is typically able to ambulate on her own and speak fluently. Facility reports abnormal speech as well as urinary incontinence at shift change at midnight.  Assessment & Plan   Acute CVA, aphasia -Patient did have recent CVA November 2015 -CT head: No acute findings -MRI brain: Patchy multifocal ischemic infarcts in the pons extending inferiorly towards the brainstem, subcentimeter infarcts involving right cerebellar hemisphere, left PCA territory -Echocardiogram: Pending -Carotid doppler: 1-39% ICA stenosis, vertebral artery flow is antegrade -LDL 159, needs statin -hemoglobin A1c pending -PT/OT consulted and pending -Neurology consulted and appreciated -Continue aspirin  Hypertension -Allow for permissive hypertension  Hyperlipidemia -Lipid panel: TC 240, TG 134, HDL 54, LDL 159 -Will start statin once patient has speech eval  History of coronary artery disease -currently chest pain-free -troponin negative  Code Status: Full  Family Communication: None at bedside during examination, daughter via phone  Disposition Plan:Admitted, pending workup  Time Spent in minutes   30 minutes  Procedures  Carotid doppler  Consults   Neurology  DVT Prophylaxis   heparin  Lab Results    Component Value Date   PLT 179 02/23/2015    Medications  Scheduled Meds: . aspirin  300 mg Rectal Daily   Or  . aspirin  325 mg Oral Daily  . dorzolamide  1 drop Both Eyes QHS  . erythromycin  1 application Both Eyes QHS  . heparin  5,000 Units Subcutaneous 3 times per day  . timolol  1 drop Both Eyes QHS   Continuous Infusions:  PRN Meds:.  Antibiotics     Anti-infectives    None        Subjective:   Alice Swanson seen and examined today.  patient denies any pain, visual changes, headache, dizziness, chest pain, shortness of breath. She does state she needs help cleaning herself up.     Objective:   Filed Vitals:   02/23/15 0440 02/23/15 0650 02/23/15 0808 02/23/15 1000  BP: 180/74 178/76 174/96 159/86  Pulse: 63 60 66 65  Temp: 97.9 F (36.6 C) 97 F (36.1 C) 97.5 F (36.4 C) 97.3 F (36.3 C)  TempSrc: Oral Oral Oral Oral  Resp: Height: 5' (1.524 m)     Weight: 57.4 kg (126 lb 8.7 oz)     SpO2: 97% 95% 99% 96%    Wt Readings from Last 3 Encounters:  02/23/15  57.4 kg (126 lb 8.7 oz)  02/05/15 60.782 kg (134 lb)  01/22/15 59.875 kg (132 lb)     Intake/Output Summary (Last 24 hours) at 02/23/15 1105 Last data filed at 02/23/15 0900  Gross per 24 hour  Intake      0 ml  Output    175 ml  Net   -175 ml    Exam  General: Well developed, well nourished, NAD, appears stated age  HEENT: NCAT, PERRLA, EOMI, Anicteic Sclera, mucous membranes moist.   Cardiovascular: S1 S2 auscultated, Irregular (monitor shows PAC)  Respiratory: Clear to auscultation bilaterally with equal chest rise  Abdomen: Soft, nontender, nondistended, + bowel sounds  Extremities: warm dry without cyanosis clubbing or edema  Neuro: AAOx3, cranial nerves grossly intact. Strength equal and bilateral in upper/lower ext  Data Review   Micro Results No results found for this or any previous visit (from the past 240 hour(s)).  Radiology Reports Ct Head Wo  Contrast  02/23/2015   CLINICAL DATA:  Bilateral lower extremity weakness  EXAM: CT HEAD WITHOUT CONTRAST  TECHNIQUE: Contiguous axial images were obtained from the base of the skull through the vertex without intravenous contrast.  COMPARISON:  09/01/2014, 08/31/2014.  FINDINGS: There is no intracranial hemorrhage, mass or evidence of acute infarction. There is no extra-axial fluid collection. There is moderately severe generalized atrophy and hemispheric hypodensity consistent with chronic small vessel ischemic disease. There is prior lacunar infarction in the left caudate head. No acute bony abnormalities are evident. Incidentally noted right frontal sinus osteoma.  IMPRESSION: Moderately severe generalized atrophy and chronic small vessel ischemic disease. No acute findings.   Electronically Signed   By: Ellery Plunkaniel R Mitchell M.D.   On: 02/23/2015 01:35   Mr Brain Wo Contrast  02/23/2015   CLINICAL DATA:  Initial evaluation for acute aphasia, dysarthria. Gait instability.  EXAM: MRI HEAD WITHOUT CONTRAST  MRA HEAD WITHOUT CONTRAST  TECHNIQUE: Multiplanar, multiecho pulse sequences of the brain and surrounding structures were obtained without intravenous contrast. Angiographic images of the head were obtained using MRA technique without contrast.  COMPARISON:  Prior CT from earlier the same day as well as previous MRI from 09/01/2014.  FINDINGS: MRI HEAD FINDINGS  Diffuse prominence of the CSF containing spaces is compatible with generalized cerebral atrophy. Extensive patchy and confluent T2/FLAIR hyperintensity within the periventricular and deep white matter both cerebral hemispheres again seen, most compatible with advanced chronic microvascular ischemic disease. Similar changes seen within the pons.  There are patchy multi focal ischemic infarcts involving the central pons, with some extension inferiorly towards the upper brainstem (series 3, image 15). Additional multi focal subcentimeter ischemic infarcts  involving the right cerebellar hemisphere (series 3, image 13). A in 7 mm mildly hyperintense focus of restricted diffusion within the posterior right middle cerebellar peduncle appears more subacute in nature (series 3, image 11). Additional patchy subcentimeter ischemic infarcts within the medial left occipital lobe as well as the mid and left aspect of the splenium in the left PCA territory (series 3, image 24 image 18, 24). There is question of small subacute ischemic infarct within the left cerebellar hemisphere as well (series 3, image 9), as well as the left thalamus (series 3, image 25). There is question of tiny associated petechial hemorrhage within the central pons (series 9, image 8). No hemorrhagic transformation. No other chronic or acute intracranial hemorrhage. Absent flow void within the distal vertebral arteries and proximal -mid basilar artery.  No mass lesion or midline shift.  No extra-axial fluid collection.  Craniocervical junction within normal limits. Scattered multilevel degenerative changes present within the visualized upper cervical spine. Pituitary gland normal.  No acute abnormality about the orbits.  Scattered mild mucoperiosteal thickening present within the ethmoidal air cells and maxillary sinuses. No mastoid effusion. Inner ear structures normal.  Bone marrow signal intensity normal. Scalp soft tissues unremarkable.  MRA HEAD FINDINGS  ANTERIOR CIRCULATION:  Visualized distal cervical segments of the internal carotid arteries are patent with antegrade flow. The petrous, cavernous, and supra clinoid segments are patent bilaterally. A1 segments are patent but demonstrate multi focal atheromatous irregularity. Multi focal air atheromatous irregularity present within the anterior cerebral arteries is well which are opacified to their distal aspects. There are superimposed moderate to severe multi focal stenoses within the left A2 segment.  M1 segments irregular but patent. Focal  moderate stenosis within the right M1 segment noted. Irregular somewhat more prevalent within the right M1 segment. Distal MCA branches well opacified.  POSTERIOR CIRCULATION:  Flow was seen within the distal vertebral arteries within the upper neck. There is essentially absent flow within the distal V4 segments bilaterally as they course into the cranial vault. Posterior inferior cerebral arteries not well opacified. There is little to no flow within the basilar artery as well. Posterior cerebral arteries not definitely visualized. Superior cerebellar arteries not visualized either. This is new relative to most recent MRA from 09/01/2014.  No aneurysm.  IMPRESSION: MRI HEAD IMPRESSION:  1. Patchy multi focal ischemic infarcts within the pons extending inferiorly towards the brainstem, with additional subcentimeter infarcts involving the right cerebellar hemisphere, and left PCA territory. There are probable more subacute infarcts within the right cerebellar hemisphere as well as the left cerebellar hemisphere and left thalamus. No significant mass effect. 2. Advanced cerebral atrophy with chronic microvascular ischemic disease.  MRA HEAD IMPRESSION:  1. Essentially absent flow within the distal vertebral arteries as they course into the cranial vault with absent flow within the basilar artery and its branches. This is new relative to prior study. 2. Patent anterior circulation. There are moderate to severe multi focal atheromatous stenoses within the left A2 segment. Moderate short-segment stenosis within the mid right M1 segment. Multi focal atheromatous irregularity within the distal MCA branches. Results were called by telephone at the time of interpretation on 02/23/2015 at 6:30 am to Dr. Lyda PeroneJARED GARDNER , who verbally acknowledged these results.   Electronically Signed   By: Rise MuBenjamin  McClintock M.D.   On: 02/23/2015 06:42   Mr Maxine GlennMra Head/brain Wo Cm  02/23/2015   CLINICAL DATA:  Initial evaluation for acute  aphasia, dysarthria. Gait instability.  EXAM: MRI HEAD WITHOUT CONTRAST  MRA HEAD WITHOUT CONTRAST  TECHNIQUE: Multiplanar, multiecho pulse sequences of the brain and surrounding structures were obtained without intravenous contrast. Angiographic images of the head were obtained using MRA technique without contrast.  COMPARISON:  Prior CT from earlier the same day as well as previous MRI from 09/01/2014.  FINDINGS: MRI HEAD FINDINGS  Diffuse prominence of the CSF containing spaces is compatible with generalized cerebral atrophy. Extensive patchy and confluent T2/FLAIR hyperintensity within the periventricular and deep white matter both cerebral hemispheres again seen, most compatible with advanced chronic microvascular ischemic disease. Similar changes seen within the pons.  There are patchy multi focal ischemic infarcts involving the central pons, with some extension inferiorly towards the upper brainstem (series 3, image 15). Additional multi focal subcentimeter ischemic infarcts involving the right cerebellar hemisphere (series 3, image 13). A in  7 mm mildly hyperintense focus of restricted diffusion within the posterior right middle cerebellar peduncle appears more subacute in nature (series 3, image 11). Additional patchy subcentimeter ischemic infarcts within the medial left occipital lobe as well as the mid and left aspect of the splenium in the left PCA territory (series 3, image 24 image 18, 24). There is question of small subacute ischemic infarct within the left cerebellar hemisphere as well (series 3, image 9), as well as the left thalamus (series 3, image 25). There is question of tiny associated petechial hemorrhage within the central pons (series 9, image 8). No hemorrhagic transformation. No other chronic or acute intracranial hemorrhage. Absent flow void within the distal vertebral arteries and proximal -mid basilar artery.  No mass lesion or midline shift.  No extra-axial fluid collection.   Craniocervical junction within normal limits. Scattered multilevel degenerative changes present within the visualized upper cervical spine. Pituitary gland normal.  No acute abnormality about the orbits.  Scattered mild mucoperiosteal thickening present within the ethmoidal air cells and maxillary sinuses. No mastoid effusion. Inner ear structures normal.  Bone marrow signal intensity normal. Scalp soft tissues unremarkable.  MRA HEAD FINDINGS  ANTERIOR CIRCULATION:  Visualized distal cervical segments of the internal carotid arteries are patent with antegrade flow. The petrous, cavernous, and supra clinoid segments are patent bilaterally. A1 segments are patent but demonstrate multi focal atheromatous irregularity. Multi focal air atheromatous irregularity present within the anterior cerebral arteries is well which are opacified to their distal aspects. There are superimposed moderate to severe multi focal stenoses within the left A2 segment.  M1 segments irregular but patent. Focal moderate stenosis within the right M1 segment noted. Irregular somewhat more prevalent within the right M1 segment. Distal MCA branches well opacified.  POSTERIOR CIRCULATION:  Flow was seen within the distal vertebral arteries within the upper neck. There is essentially absent flow within the distal V4 segments bilaterally as they course into the cranial vault. Posterior inferior cerebral arteries not well opacified. There is little to no flow within the basilar artery as well. Posterior cerebral arteries not definitely visualized. Superior cerebellar arteries not visualized either. This is new relative to most recent MRA from 09/01/2014.  No aneurysm.  IMPRESSION: MRI HEAD IMPRESSION:  1. Patchy multi focal ischemic infarcts within the pons extending inferiorly towards the brainstem, with additional subcentimeter infarcts involving the right cerebellar hemisphere, and left PCA territory. There are probable more subacute infarcts  within the right cerebellar hemisphere as well as the left cerebellar hemisphere and left thalamus. No significant mass effect. 2. Advanced cerebral atrophy with chronic microvascular ischemic disease.  MRA HEAD IMPRESSION:  1. Essentially absent flow within the distal vertebral arteries as they course into the cranial vault with absent flow within the basilar artery and its branches. This is new relative to prior study. 2. Patent anterior circulation. There are moderate to severe multi focal atheromatous stenoses within the left A2 segment. Moderate short-segment stenosis within the mid right M1 segment. Multi focal atheromatous irregularity within the distal MCA branches. Results were called by telephone at the time of interpretation on 02/23/2015 at 6:30 am to Dr. Lyda Perone , who verbally acknowledged these results.   Electronically Signed   By: Rise Mu M.D.   On: 02/23/2015 06:42    CBC  Recent Labs Lab 02/23/15 0121 02/23/15 0125  WBC 10.0  --   HGB 14.1 15.0  HCT 42.3 44.0  PLT 179  --   MCV 93.4  --  MCH 31.1  --   MCHC 33.3  --   RDW 14.1  --   LYMPHSABS 2.3  --   MONOABS 0.7  --   EOSABS 0.2  --   BASOSABS 0.0  --     Chemistries   Recent Labs Lab 02/23/15 0121 02/23/15 0125  NA 139 143  K 3.6 3.6  CL 110 108  CO2 21*  --   GLUCOSE 127* 127*  BUN 19 21*  CREATININE 0.81 0.70  CALCIUM 8.9  --   AST 20  --   ALT 10*  --   ALKPHOS 80  --   BILITOT 1.0  --    ------------------------------------------------------------------------------------------------------------------ estimated creatinine clearance is 35.6 mL/min (by C-G formula based on Cr of 0.7). ------------------------------------------------------------------------------------------------------------------ No results for input(s): HGBA1C in the last 72 hours. ------------------------------------------------------------------------------------------------------------------  Recent Labs   02/23/15 0630  CHOL 240*  HDL 54  LDLCALC 159*  TRIG 134  CHOLHDL 4.4   ------------------------------------------------------------------------------------------------------------------ No results for input(s): TSH, T4TOTAL, T3FREE, THYROIDAB in the last 72 hours.  Invalid input(s): FREET3 ------------------------------------------------------------------------------------------------------------------ No results for input(s): VITAMINB12, FOLATE, FERRITIN, TIBC, IRON, RETICCTPCT in the last 72 hours.  Coagulation profile  Recent Labs Lab 02/23/15 0121  INR 1.03    No results for input(s): DDIMER in the last 72 hours.  Cardiac Enzymes No results for input(s): CKMB, TROPONINI, MYOGLOBIN in the last 168 hours.  Invalid input(s): CK ------------------------------------------------------------------------------------------------------------------ Invalid input(s): POCBNP    Alice Swanson D.O. on 02/23/2015 at 11:05 AM  Between 7am to 7pm - Pager - [phone]  After 7pm go to [email or web address] - password [email or web address]  And look for the night coverage person covering for me after hours  [facility] Office  [phone]

## 2015-02-23 NOTE — Evaluation (Signed)
Clinical/Bedside Swallow Evaluation Patient Details  Name: Alice Swanson MRN: [id number] Date of Birth: Aug 11, 1920  Today's Date: 02/23/2015 Time: SLP Start Time (ACUTE ONLY): 1403 SLP Stop Time (ACUTE ONLY): 1421 SLP Time Calculation (min) (ACUTE ONLY): 18 min  Past Medical History:  Past Medical History  Diagnosis Date  . Arthritis   . Depression   . Glaucoma     both eyes  . Hyperlipidemia   . Hypertension   . Cardiac arrhythmia due to congenital heart disease   . Sciatica   . Anxiety   . Allergic rhinitis   . Macular degeneration of both eyes   . TIA (transient ischemic attack) 08/31/14  . Bradycardia 08/31/14  . Heart block AV first degree 08/31/14  . Memory deficit 08/31/14  . Cerebral atrophy 08/31/14  . Cerebrovascular disease 08/31/14  . Cerebellar infarction 08/31/14  . Osteoarthritis 08/31/14  . Hypokalemia    Past Surgical History:  Past Surgical History  Procedure Laterality Date  . Tonsillectomy and adenoidectomy  1926  . Appendectomy  1932  . Abdominal hysterectomy  1962  . Cataract extraction w/ intraocular lens implant Right 12/2011    Dr. Pricilla Holmucker  . Cataract extraction w/ intraocular lens implant Left 02/2013    Dr. Dione BoozeGroat  . Colonoscopy  2005   HPI:  Alice Baltimoremily Schaumburg is an 81 y.o. female with a past medical history significant for HTN, hyperlipidemia, MI, TIA, ischemic cerebellar infarct 11/15, OA, brought in for further evaluation of language impairment. [facility] and at baseline ambulates with a walker. Her last known well is not really known, but apparently patient developed bilateral legs weakness and language impaired that motivated calling EMS due to concern for stroke. Patient said that she is aware of having trouble taking and " my right leg getting weaker" since probably yesterday. Daughter and patient both report recent decline in her health, definitely worse past 6 months. MRI shows patchy pontine and brainstem infartcs, bilateral  cerebellar infarcts.    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 Swallow is noticably delayed and there are signs of residuals and delayed cough, indicative of reduced strength and sensation. Signs consistent across textures. Recommend MBS for objective evaluation of swallowing; pt to remain NPO unless pt and family accept risk of aspiration in favor of pts comfort. SLP to f/u with MBS tomorrow.     Aspiration Risk  Severe    Diet Recommendation NPO        Other  Recommendations Oral Care Recommendations: Oral care QID   Follow Up Recommendations       Frequency and Duration        Pertinent Vitals/Pain NA    SLP Swallow Goals     Swallow Study Prior Functional Status       General Other Pertinent Information: Alice Baltimoremily Dubinsky is an 81 y.o. female with a past medical history significant for HTN, hyperlipidemia, MI, TIA, ischemic cerebellar infarct 11/15, OA, brought in for further evaluation of language impairment. [facility] and at baseline ambulates with a walker. Her last known well is not really known, but apparently patient developed bilateral legs weakness and language impaired that motivated calling EMS due to concern for stroke. Patient said that she is aware of having trouble taking and " my right leg getting weaker" since probably yesterday. Daughter and patient both report recent decline in her health, definitely worse past 6 months.  MRI shows patchy pontine and brainstem infartcs, bilateral cerebellar infarcts.  Type of Study: Bedside swallow evaluation Previous Swallow Assessment: none Diet Prior to this Study: NPO Temperature Spikes Noted: No Respiratory Status: Room air History of Recent Intubation: No Behavior/Cognition: Alert;Cooperative;Pleasant mood Oral Cavity - Dentition: Adequate natural dentition/normal for age Self-Feeding  Abilities: Able to feed self;Needs assist Patient Positioning: Upright in bed Baseline Vocal Quality: Normal Volitional Cough: Strong Volitional Swallow: Able to elicit    Oral/Motor/Sensory Function Overall Oral Motor/Sensory Function: Impaired Labial ROM: Within Functional Limits Labial Symmetry: Within Functional Limits Labial Strength: Within Functional Limits Labial Sensation: Within Functional Limits Lingual ROM: Within Functional Limits Lingual Symmetry: Within Functional Limits Lingual Strength: Reduced (also suspect BOT weakness) Lingual Sensation:  (UTA) Facial ROM: Within Functional Limits Facial Symmetry: Within Functional Limits Facial Strength: Within Functional Limits Facial Sensation: Within Functional Limits   Ice Chips Ice chips: Not tested   Thin Liquid Thin Liquid: Impaired Presentation: Cup;Self Fed Pharyngeal  Phase Impairments: Suspected delayed Swallow;Wet Vocal Quality;Throat Clearing - Delayed;Cough - Delayed    Nectar Thick Nectar Thick Liquid: Impaired Presentation: Cup;Self Fed Pharyngeal Phase Impairments: Suspected delayed Swallow;Cough - Immediate;Cough - Delayed;Wet Vocal Quality;Multiple swallows   Honey Thick Honey Thick Liquid: Not tested   Puree Puree: Impaired Presentation: Spoon Pharyngeal Phase Impairments: Suspected delayed Swallow;Cough - Delayed;Throat Clearing - Delayed   Solid   GO    Solid: Not tested      Harlon DittyBonnie Kailly Richoux, MA CCC-SLP [phone]  Malinda Mayden, Riley NearingBonnie Caroline 02/23/2015,2:27 PM

## 2015-02-23 NOTE — ED Notes (Signed)
Reported to Dr. Julian ReilGardner, hospitalist, that patient failed swallow screen, coughing after drinking from straw. Neurologist to see patient. Neuro findings: slurred speech. MD acknowledges.

## 2015-02-23 NOTE — ED Notes (Signed)
Dr. Gardner at the bedside.  

## 2015-02-23 NOTE — Progress Notes (Signed)
Received patient from E.D transported via bed  By E.D nurse. Pt admitted to room 4N14 alert no acute distress  Oriented to room and unit and upcoming procedures explained. Telemetry connected and central monitoring notified.

## 2015-02-23 NOTE — Progress Notes (Signed)
VASCULAR LAB PRELIMINARY  PRELIMINARY  PRELIMINARY  PRELIMINARY  Carotid Dopplers completed.    Preliminary report:  1-39% ICA stenosis.  Vertebral artery flow is antegrade.   Antione Obar, RVT 02/23/2015, 10:42 AM

## 2015-02-23 NOTE — Progress Notes (Signed)
STROKE TEAM PROGRESS NOTE   HISTORY Alice Swanson is an 81 y.o. female with a past medical history significant for HTN, hyperlipidemia, MI, TIA, ischemic cerebellar infarct 11/15, OA, brought in for further evaluation of language impairment. Patient lives at [facility] and at baseline ambulates with a walker. Her last known well is not really known, but apparently patient developed bilateral legs weakness and language impaired that motivated calling EMS due to concern for stroke. Patient said that she is aware of having trouble taking and " my right leg getting weaker" since probably yesterday. She denies HA, vertigo, double vision, difficulty swallowing, falls, or vision impairment. CT brain was personally reviewed and showed no acute abnormality. Patient was not administered TPA secondary to delay in arrival, unknown last known well. She was admitted for further evaluation and treatment.   SUBJECTIVE (INTERVAL HISTORY) Her daughter is at the bedside.  Overall she feels her condition is gradually worsening. Daughter and patient both report recent decline in her health - she had been reading novels, going to the opera and interpreting poetry - really enjoying life per pt - now more limited in what she can do. Definitely worse past 6 weeks.    OBJECTIVE Temp:  [97 F (36.1 C)-98.7 F (37.1 C)] 97.3 F (36.3 C) (05/09 1000) Pulse Rate:  [53-78] 65 (05/09 1000) Cardiac Rhythm:  [-]  Resp:  [15-20] 18 (05/09 1000) BP: (137-191)/(50-151) 159/86 mmHg (05/09 1000) SpO2:  [95 %-100 %] 96 % (05/09 1000) Weight:  [57.4 kg (126 lb 8.7 oz)] 57.4 kg (126 lb 8.7 oz) (05/09 0440)  No results for input(s): GLUCAP in the last 168 hours.  Recent Labs Lab 02/23/15 0121 02/23/15 0125  NA 139 143  K 3.6 3.6  CL 110 108  CO2 21*  --   GLUCOSE 127* 127*  BUN 19 21*  CREATININE 0.81 0.70  CALCIUM 8.9  --     Recent Labs Lab 02/23/15 0121  AST 20  ALT 10*  ALKPHOS 80  BILITOT 1.0  PROT 6.8   ALBUMIN 3.5    Recent Labs Lab 02/23/15 0121 02/23/15 0125  WBC 10.0  --   NEUTROABS 6.8  --   HGB 14.1 15.0  HCT 42.3 44.0  MCV 93.4  --   PLT 179  --    No results for input(s): CKTOTAL, CKMB, CKMBINDEX, TROPONINI in the last 168 hours.  Recent Labs  02/23/15 0121  LABPROT 13.7  INR 1.03    Recent Labs  02/23/15 0229  COLORURINE YELLOW  LABSPEC 1.016  PHURINE 6.0  GLUCOSEU NEGATIVE  HGBUR MODERATE*  BILIRUBINUR NEGATIVE  KETONESUR NEGATIVE  PROTEINUR 30*  UROBILINOGEN 0.2  NITRITE NEGATIVE  LEUKOCYTESUR NEGATIVE       Component Value Date/Time   CHOL 240* 02/23/2015 0630   TRIG 134 02/23/2015 0630   HDL 54 02/23/2015 0630   CHOLHDL 4.4 02/23/2015 0630   VLDL 27 02/23/2015 0630   LDLCALC 159* 02/23/2015 0630   Lab Results  Component Value Date   HGBA1C 5.9* 09/01/2014      Component Value Date/Time   LABOPIA NONE DETECTED 02/23/2015 0229   COCAINSCRNUR NONE DETECTED 02/23/2015 0229   LABBENZ NONE DETECTED 02/23/2015 0229   AMPHETMU NONE DETECTED 02/23/2015 0229   THCU NONE DETECTED 02/23/2015 0229   LABBARB NONE DETECTED 02/23/2015 0229     Recent Labs Lab 02/23/15 0121  ETH <5    Ct Head Wo Contrast 02/23/2015    Moderately severe generalized atrophy and  chronic small vessel ischemic disease. No acute findings.     MRI HEAD  02/23/2015    1. Patchy multi focal ischemic infarcts within the pons extending inferiorly towards the brainstem, with additional subcentimeter infarcts involving the right cerebellar hemisphere, and left PCA territory. There are probable more subacute infarcts within the right cerebellar hemisphere as well as the left cerebellar hemisphere and left thalamus. No significant mass effect. 2. Advanced cerebral atrophy with chronic microvascular ischemic disease.   MRA HEAD  02/23/2015    1. Essentially absent flow within the distal vertebral arteries as they course into the cranial vault with absent flow within the basilar  artery and its branches. This is new relative to prior study. 2. Patent anterior circulation. There are moderate to severe multi focal atheromatous stenoses within the left A2 segment. Moderate short-segment stenosis within the mid right M1 segment. Multi focal atheromatous irregularity within the distal MCA branches.   Carotid Doppler  There is 1-39% bilateral ICA stenosis. Vertebral artery flow is antegrade.    2D echo - pending  PHYSICAL EXAM  Temp:  [97 F (36.1 C)-98.7 F (37.1 C)] 98.4 F (36.9 C) (05/09 1400) Pulse Rate:  [53-78] 71 (05/09 1400) Resp:  [15-20] 18 (05/09 1400) BP: (137-191)/(50-151) 178/81 mmHg (05/09 1400) SpO2:  [92 %-100 %] 92 % (05/09 1400) Weight:  [126 lb 8.7 oz (57.4 kg)] 126 lb 8.7 oz (57.4 kg) (05/09 0440)  General - Well nourished, well developed, in no apparent distress.  Ophthalmologic - Fundi not visualized due to incorporation.  Cardiovascular - Regular rate and rhythm with no murmur.  Mental Status -  Awake, alert, severe dysarthria, not able to assess language and orientation.  Cranial Nerves II - XII - II - Visual field intact OU. III, IV, VI - Extraocular movements intact. V - Facial sensation intact bilaterally. VII - Facial movement intact bilaterally. VIII - Hearing & vestibular intact bilaterally. X - Palate elevates symmetrically, severe dysarthria. XI - Chin turning & shoulder shrug intact bilaterally. XII - Tongue protrusion intact.  Motor Strength - The patient's strength was RUE 3+/5 with drift and BLE 4/5. LUE 5-/5. Bulk was decreased and fasciculations were absent.   Motor Tone - Muscle tone was assessed at the neck and appendages and was normal.  Reflexes - The patient's reflexes were 1+ in all extremities and she had no pathological reflexes.  Sensory - Light touch, temperature/pinprick were assessed and were symmetrical.    Coordination - The patient had normal movements in the hands with no ataxia or dysmetria.   Tremor was absent.  Gait and Station - not tested due to safety concerns.   ASSESSMENT/PLAN [name] is a 81 y.o. female with history of HTN, hyperlipidemia, MI, TIA, possible ischemic cerebellar infarct 08/2014 presenting with bilateral leg weakness and severe dysarthria. She did not receive IV t-PA due to delay in arrival, unknown time of onset.   Stroke:  Patchy pontine and brainstem infartcs, bilateral cerebellar infarcts felt to be due to large vessel atherosclerosis of posterior circulation  Resultant  severe dysarthria  MRI  Patchy pontine and brainstem infartcs, bilateral cerebellar infarcts  MRA  Absent posterior circulation (bilateral distal VAs and BA), new since last MRA in 08/2014  Carotid Doppler  No significant stenosis   2D Echo  pending   LDL 159, not at goal  HgbA1c 5.9 in Nov 2015  Heparin 5000 units sq tid for VTE prophylaxis Diet NPO time specified  aspirin 325 mg orally  every day prior to admission, now on aspirin 300 mg suppository daily. Once able to swallow, given large vessel disease, recommend change to aspirin 81 mg and plavic 75 mg daily x 3 months then plavix alone.  Ongoing aggressive stroke risk factor management  Therapy recommendations:  pending   Disposition:  Anticipate need for SNF at discharge. Await PT opinion  Hypertension  Home meds:   Losartan  Permissive hypertension (OK if < 220/120) for 24-48 hours after stroke and then gradually normalize in 5-7 days  Due to flow-void at the posterior circulation, long-term BP goal 130-160. Avoid hypotension.  Hyperlipidemia  Home meds:  No statin  LDL 159, goal < 70  Add lipitor 20 mg daily  Continue statin at discharge  Other Stroke Risk Factors  Advanced age  Hx stroke/TIA - 08/2014 possible stroke w/ right sided weakness and slurred speech, MRI negative  Family hx stroke (father)  Hx CAD  Other Pertinent History  Macular degeneration Both eyes  Hospital day #  0  Rhoderick MoodyBIBY,SHARON  [facility] See Amion for Pager information 02/23/2015 11:22 AM   I, the attending vascular neurologist, have personally obtained a history, examined the patient, evaluated laboratory data, individually viewed imaging studies and agree with radiology interpretations. I also obtained additional history from pt's daughter at bedside. I also discussed with Dr. Catha GosselinMikhail regarding her care plan. Together with the NP/PA, we formulated the assessment and plan of care which reflects our mutual decision.  I have made any additions or clarifications directly to the above note and agree with the findings and plan as currently documented.   81 year old female with history of hypertension, hyperlipidemia, possible stroke in 08/2014 was admitted for bilateral LE weakness and severe dysarthria. MRI showed scattered brainstem infarct, MRA showed flow void of bilateral distal VAs and BA, which is new development since 6 months ago. Daughter will need patient had health decline for the last 6 weeks. Recommend dural antiplatelet for 3 months and then Plavix alone, and statin for high LDL. Due to high-grade intracranial stenosis, recommend long-term BP goal 130-160 and avoid hypotension.   Neurology will sign off. Please call with questions. [facility]. Thanks for the consult.  Marvel PlanJindong Veryl Abril, MD PhD Stroke Neurology 02/23/2015 4:44 PM    To contact Stroke Continuity provider, please refer to [email or web address]. After hours, contact General Neurology

## 2015-02-23 NOTE — ED Notes (Signed)
Dr. Camillo, neurology, at the bedside.  

## 2015-02-24 ENCOUNTER — Inpatient Hospital Stay (HOSPITAL_COMMUNITY): Payer: Medicare Other

## 2015-02-24 LAB — HEMOGLOBIN A1C
Hgb A1c MFr Bld: 5.5 % (ref 4.8–5.6)
MEAN PLASMA GLUCOSE: 111 mg/dL

## 2015-02-24 NOTE — Progress Notes (Addendum)
Patient appeared very sleepy this AM. She is able to response with voice and follow some commands. Patient noted multiple yawns as she is waking up. PT and RN at bedside assisting patient up in the chair.   Sim BoastHavy, RN

## 2015-02-24 NOTE — Procedures (Signed)
Objective Swallowing Evaluation:  (MBS)  Patient Details  Name: Alice Swanson MRN: [id number] Date of Birth: 1920-07-18  Today's Date: 02/24/2015 Time: SLP Start Time (ACUTE ONLY): 1015-SLP Stop Time (ACUTE ONLY): 1030 SLP Time Calculation (min) (ACUTE ONLY): 15 min  Past Medical History:  Past Medical History  Diagnosis Date  . Arthritis   . Depression   . Glaucoma     both eyes  . Hyperlipidemia   . Hypertension   . Cardiac arrhythmia due to congenital heart disease   . Sciatica   . Anxiety   . Allergic rhinitis   . Macular degeneration of both eyes   . TIA (transient ischemic attack) 08/31/14  . Bradycardia 08/31/14  . Heart block AV first degree 08/31/14  . Memory deficit 08/31/14  . Cerebral atrophy 08/31/14  . Cerebrovascular disease 08/31/14  . Cerebellar infarction 08/31/14  . Osteoarthritis 08/31/14  . Hypokalemia    Past Surgical History:  Past Surgical History  Procedure Laterality Date  . Tonsillectomy and adenoidectomy  1926  . Appendectomy  1932  . Abdominal hysterectomy  1962  . Cataract extraction w/ intraocular lens implant Right 12/2011    Dr. Pricilla Holm  . Cataract extraction w/ intraocular lens implant Left 02/2013    Dr. Dione Booze  . Colonoscopy  2005   HPI:  Other Pertinent Information: Alice Swanson is an 81 y.o. female with a past medical history significant for HTN, hyperlipidemia, MI, TIA, ischemic cerebellar infarct 11/15, OA, brought in for further evaluation of language impairment. Patient lives at [facility] and at baseline ambulates with a walker. Her last known well is not really known, but apparently patient developed bilateral leg weakness and language impairment that motivated call to EMS due to concern for stroke. Patient said that she is aware of having trouble talking and "my right leg getting weaker" since 5/8. Daughter and patient both report recent decline in her health, definitely worse past 6 months. MRI shows patchy pontine and brainstem  infartcs, bilateral cerebellar infarcts.  MBS being completed today to determine appropriateness for po intake.  No Data Recorded  Assessment / Plan / Recommendation CHL IP CLINICAL IMPRESSIONS 02/24/2015  Therapy Diagnosis (None)  Clinical Impression Moderate oropharyngeal sensory and motor based dysphagia, characterized by poor oral bolus prep and propulsion with slight anterior leakage on the right across consistencies. Posterior spill to vallecula was noted on nectar and honey thick liquids, and to the pyriform on puree consistencies. Pt exhibits little to no post-swallow residue pharyngeally, however, airway compromise is seen across consistencies. Cough response was inconsistent, and weak at best.  Minimal aspiration was noted on teaspoon boluses of nectar and honey thick liquids, while gross penetration and aspiration of half the bolus of puree was seen. At this time, NPO status is recommended, including med adminstration. Recommend consideration of nonoral feeding method if within pt/family wishes. ST will continue to follow pt for dysphagia therapy. Daughter present during evaluation, and in agreement with plan.      CHL IP TREATMENT RECOMMENDATION 02/24/2015  Treatment Recommendations Therapy as outlined in treatment plan below     CHL IP DIET RECOMMENDATION 02/24/2015  SLP Diet Recommendations Alternative means - temporary;NPO  Liquid Administration via (None)  Medication Administration Via alternative means  Compensations (None)  Postural Changes and/or Swallow Maneuvers (None)     CHL IP OTHER RECOMMENDATIONS 02/24/2015  Recommended Consults (None)  Oral Care Recommendations Oral care QID  Other Recommendations Remove water pitcher;Have oral suction available     CHL  IP FOLLOW UP RECOMMENDATIONS 09/02/2014  Follow up Recommendations None     CHL IP FREQUENCY AND DURATION 02/24/2015  Speech Therapy Frequency (ACUTE ONLY) (None)  Treatment Duration 2 weeks     Pertinent  Vitals/Pain VSS, no pain evident    SLP Swallow Goals Tolerance of po trials in dysphagia therapy      CHL IP REASON FOR REFERRAL 02/24/2015  Reason for Referral Objectively evaluate swallowing function     CHL IP ORAL PHASE 02/24/2015  Lips (None)  Tongue (None)  Mucous membranes (None)  Nutritional status (None)  Other (None)  Oxygen therapy (None)  Oral Phase Impaired  Oral - Pudding Teaspoon (None)  Oral - Pudding Cup (None)  Oral - Honey Teaspoon (None)  Oral - Honey Cup (None)  Oral - Honey Syringe (None)  Oral - Nectar Teaspoon (None)  Oral - Nectar Cup (None)  Oral - Nectar Straw (None)  Oral - Nectar Syringe (None)  Oral - Ice Chips (None)  Oral - Thin Teaspoon (None)  Oral - Thin Cup (None)  Oral - Thin Straw (None)  Oral - Thin Syringe (None)  Oral - Puree (None)  Oral - Mechanical Soft (None)  Oral - Regular (None)  Oral - Multi-consistency (None)  Oral - Pill (None)  Oral Phase - Comment (None)      CHL IP PHARYNGEAL PHASE 02/24/2015  Pharyngeal Phase Impaired  Pharyngeal - Pudding Teaspoon (None)  Penetration/Aspiration details (pudding teaspoon) (None)  Pharyngeal - Pudding Cup (None)  Penetration/Aspiration details (pudding cup) (None)  Pharyngeal - Honey Teaspoon (None)  Penetration/Aspiration details (honey teaspoon) (None)  Pharyngeal - Honey Cup (None)  Penetration/Aspiration details (honey cup) (None)  Pharyngeal - Honey Syringe (None)  Penetration/Aspiration details (honey syringe) (None)  Pharyngeal - Nectar Teaspoon (None)  Penetration/Aspiration details (nectar teaspoon) (None)  Pharyngeal - Nectar Cup (None)  Penetration/Aspiration details (nectar cup) (None)  Pharyngeal - Nectar Straw (None)  Penetration/Aspiration details (nectar straw) (None)  Pharyngeal - Nectar Syringe (None)  Penetration/Aspiration details (nectar syringe) (None)  Pharyngeal - Ice Chips (None)  Penetration/Aspiration details (ice chips) (None)  Pharyngeal -  Thin Teaspoon (None)  Penetration/Aspiration details (thin teaspoon) (None)  Pharyngeal - Thin Cup (None)  Penetration/Aspiration details (thin cup) (None)  Pharyngeal - Thin Straw (None)  Penetration/Aspiration details (thin straw) (None)  Pharyngeal - Thin Syringe (None)  Penetration/Aspiration details (thin syringe') (None)  Pharyngeal - Puree (None)  Penetration/Aspiration details (puree) (None)  Pharyngeal - Mechanical Soft (None)  Penetration/Aspiration details (mechanical soft) (None)  Pharyngeal - Regular (None)  Penetration/Aspiration details (regular) (None)  Pharyngeal - Multi-consistency (None)  Penetration/Aspiration details (multi-consistency) (None)  Pharyngeal - Pill (None)  Penetration/Aspiration details (pill) (None)  Pharyngeal Comment (None)      CHL IP CERVICAL ESOPHAGEAL PHASE 02/24/2015  Cervical Esophageal Phase WFL  Pudding Teaspoon (None)  Pudding Cup (None)  Honey Teaspoon (None)  Honey Cup (None)  Honey Straw (None)  Nectar Teaspoon (None)  Nectar Cup (None)  Nectar Straw (None)  Nectar Sippy Cup (None)  Thin Teaspoon (None)  Thin Cup (None)  Thin Straw (None)  Thin Sippy Cup (None)  Cervical Esophageal Comment (None)    CHL IP GO 09/02/2014  Functional Assessment Tool Used expressive communication clinical judgement  Functional Limitations Spoken language expressive  Swallow Current Status [phone](G8996) (None)  Swallow Goal Status (N0272(G8997) (None)  Swallow Discharge Status (Z3664(G8998) (None)  Motor Speech Current Status (Q0347(G8999) (None)  Motor Speech Goal Status (Q2595(G9186) (None)  Motor Speech Goal Status (G3875(G9158) (None)  Spoken Language Comprehension Current Status [phone](G9159) (None)  Spoken Language Comprehension Goal Status [phone](G9160) (None)  Spoken Language Comprehension Discharge Status [phone](G9161) (None)  Spoken Language Expression Current Status [phone](G9162) CI  Spoken Language Expression Goal Status (G9562(G9163) CI  Spoken Language Expression Discharge Status [phone](G9164)  CI  Attention Current Status (V7846(G9165) (None)  Attention Goal Status (N6295(G9166) (None)  Attention Discharge Status [phone](G9167) (None)  Memory Current Status (K4401(G9168) (None)  Memory Goal Status (U2725(G9169) (None)  Memory Discharge Status (D6644(G9170) (None)  Voice Current Status (I3474(G9171) (None)  Voice Goal Status (Q5956(G9172) (None)  Voice Discharge Status (L8756(G9173) (None)  Other Speech-Language Pathology Functional Limitation [phone](G9174) (None)  Other Speech-Language Pathology Functional Limitation Goal Status (J1884(G9175) (None)  Other Speech-Language Pathology Functional Limitation Discharge Status [phone](G9176) (None)    Celia B. DuranBueche, [facility] LLCMSP, CCC-SLP [phone]        Leigh AuroraBueche, Celia Brown 02/24/2015, 11:16 AM

## 2015-02-24 NOTE — Evaluation (Signed)
Speech Language Pathology Evaluation Patient Details Name: Alice Swanson [place] MRN: [id number] DOB: 1920/03/09 Today's Date: 02/24/2015 Time: 4098-11911030-1045 SLP Time Calculation (min) (ACUTE ONLY): 15 min  Problem List:  Patient Active Problem List   Diagnosis Date Noted  . Aphasia 02/23/2015  . Acute CVA (cerebrovascular accident)   . Essential hypertension   . Hyperlipidemia   . Cerebral infarction due to thrombosis of basilar artery   . Dysarthria   . Blepharitis of both eyes 10/30/2014  . Sinusitis, chronic 10/30/2014  . Cerebral atrophy   . Hypokalemia 09/03/2014  . Bradycardia 09/01/2014  . TIA (transient ischemic attack) 08/31/2014  . Cerebellar infarction 08/31/2014  . Cerebrovascular disease 08/31/2014  . Memory deficits 01/12/2014  . Constipation 01/12/2014  . Abnormality of gait 08/29/2013  . Sciatic pain 05/05/2013  . Hemorrhoids, external 05/05/2013  . Hypertension 08/10/2012  . Depression 08/10/2012  . Anxiety 08/10/2012  . Hypercholesteremia 08/10/2012  . Allergic rhinitis 08/10/2012   Past Medical History:  Past Medical History  Diagnosis Date  . Arthritis   . Depression   . Glaucoma     both eyes  . Hyperlipidemia   . Hypertension   . Cardiac arrhythmia due to congenital heart disease   . Sciatica   . Anxiety   . Allergic rhinitis   . Macular degeneration of both eyes   . TIA (transient ischemic attack) 08/31/14  . Bradycardia 08/31/14  . Heart block AV first degree 08/31/14  . Memory deficit 08/31/14  . Cerebral atrophy 08/31/14  . Cerebrovascular disease 08/31/14  . Cerebellar infarction 08/31/14  . Osteoarthritis 08/31/14  . Hypokalemia    Past Surgical History:  Past Surgical History  Procedure Laterality Date  . Tonsillectomy and adenoidectomy  1926  . Appendectomy  1932  . Abdominal hysterectomy  1962  . Cataract extraction w/ intraocular lens implant Right 12/2011    Dr. Pricilla Holmucker  . Cataract extraction w/ intraocular lens implant Left 02/2013    Dr. Dione BoozeGroat  . Colonoscopy  2005   HPI:  Alice Swanson is an 81 y.o. female with a past medical history significant for HTN, hyperlipidemia, MI, TIA, ischemic cerebellar infarct 11/15, OA, brought in for further evaluation of language impairment. [facility] and at baseline ambulates with a walker. Her last known well is not really known, but apparently patient developed bilateral leg weakness and language impairment that motivated call to EMS due to concern for stroke. Patient said that she is aware of having trouble talking and "my right leg getting weaker" since 5/8. Daughter and patient both report recent decline in her health, definitely worse past 6 months. MRI shows patchy pontine and brainstem infartcs, bilateral cerebellar infarcts.  SLE completed to establish receptive and expressive communication abilities and plan of care for treatment.   Assessment / Plan / Recommendation Clinical Impression  Pt exhibits change in presentation today as compared to yesterday's evaluation. Pt nonverbal today, not following commands. ST to continue to follow pt for aphasia therapy, education, and to establish effective means of communication.    SLP Assessment  Patient needs continued Speech Lanaguage Pathology Services    Follow Up Recommendations  24 hour supervision/assistance;Skilled Nursing facility    Frequency and Duration min 1 x/week  2 weeks   Pertinent Vitals/Pain Pain Assessment: Faces Faces Pain Scale: No hurt   SLP Goals  Patient/Family Stated Goal: none stated Potential Considerations (ACUTE ONLY): Medical prognosis;Severity of impairments;Previous level of function  SLP Evaluation Prior Functioning  Cognitive/Linguistic Baseline: Within  functional limits Type of Home: Independent living facility  Lives With: Alone Available Help at Discharge: Family Vocation: Retired   IT consultantCognition  Overall Cognitive Status: Impaired/Different from baseline    Comprehension   Auditory Comprehension Overall Auditory Comprehension: Impaired Yes/No Questions: Impaired Basic Immediate Environment Questions: 0-24% accurate Commands: Impaired One Step Basic Commands: 0-24% accurate Visual Recognition/Discrimination Discrimination: Not tested Reading Comprehension Reading Status: Not tested    Expression Verbal Expression Overall Verbal Expression: Impaired Initiation: Impaired Automatic Speech:  (unable to demonstrate) Level of Generative/Spontaneous Verbalization:  (nonverbal) Repetition: Impaired Level of Impairment: Word level Naming: Impairment Responsive: 0-25% accurate Confrontation: Impaired Convergent: 0-24% accurate Divergent: 0-24% accurate Non-Verbal Means of Communication: Not applicable Written Expression Dominant Hand: Right Written Expression: Not tested   Oral / Motor Oral Motor/Sensory Function Overall Oral Motor/Sensory Function: Impaired Labial ROM: Reduced right Labial Strength: Reduced Lingual Strength: Reduced Facial ROM: Reduced right Facial Symmetry: Right droop Velum: Within Functional Limits Mandible: Within Functional Limits   GO   Timia Casselman B. KermanBueche, [facility] CenterMSP, CCC-SLP [phone]   Leigh AuroraBueche, Annah Jasko Brown 02/24/2015, 11:27 AM

## 2015-02-24 NOTE — Progress Notes (Signed)
Speech Language Pathology Treatment:  (Family education)  Patient Details Name: Alice Swanson MRN: [id number] DOB: [birth year] Today's Date: 02/24/2015 Time: [id number] SLP Time Calculation (min) (ACUTE ONLY): 15 min  Assessment / Plan / Recommendation Clinical Impression  Pt's daughter was seen on the unit this afternoon following MBS this morning. Daughter had several questions for SLP regarding MBS results, prognosis, options re: feeding, appropriateness for oral satisfaction feedings, etc. SLP addressed dtr's concerns and answered questions. Based on MBS today, pt is unsafe for po intake at this time. Recommend consideration of short term nonoral feeding method, if this is an option. If pt alertness and ability to participate actively in bedside reassessment improve, repeat MBS may be considered. Given pt age, rapid recent decline, and MRI results, significant recovery is unlikely. Encouraged daughter to discuss these issues with family and MD. ST to follow for family decisions as well as changes in pt status and appropriateness for po trials or repeat MBS.   HPI Other Pertinent Information: Alice Swanson is an 81 y.o. female with a past medical history significant for HTN, hyperlipidemia, MI, TIA, ischemic cerebellar infarct 11/15, OA, brought in for further evaluation of language impairment. [facility] and at baseline ambulates with a walker. Her last known well is not really known, but apparently patient developed bilateral leg weakness and language impairment that motivated call to EMS due to concern for stroke. Patient said that she is aware of having trouble talking and "my right leg getting weaker" since 5/8. Daughter and patient both report recent decline in her health, definitely worse past 6 months. MRI shows patchy pontine and brainstem infartcs, bilateral cerebellar infarcts.    Pertinent Vitals Pain Assessment: No/denies pain (Pt sleeping)  SLP Plan  Continue with current  plan of care    Recommendations Diet recommendations: NPO Medication Administration: Via alternative means              Follow up Recommendations: 24 hour supervision/assistance;Skilled Nursing facility Plan: Continue with current plan of care    GO   Celia B. FloraBueche, Jfk Medical CenterMSP, CCC-SLP [phone]   Leigh AuroraBueche, Celia Brown 02/24/2015, 3:09 PM

## 2015-02-24 NOTE — Evaluation (Signed)
Occupational Therapy Evaluation Patient Details Name: Henderson Baltimoremily Eunice MRN: [id number] DOB: 05-21-1920 Today's Date: 02/24/2015    History of Present Illness 81 y.o. female with a PMHx of hyperlipidemia, HTN, TIA, cerebrovascular disease, and osteoarthritis who presents after being found to have trouble difficulty speaking and walking. MRI revealed patchy multi focal ischemic infarcts within the pons extending inferiorly towards the brainstem, with additional subcentimeter infarcts involving the right cerebellar hemisphere, and left PCA territory.   Clinical Impression   PT admitted with multiple infarcts ( pons extending into brainstem, R cerebellar and L PCA). Pt currently with functional limitiations due to the deficits listed below (see OT problem list). PTA living at ALF using RW MOD I. Pt will benefit from skilled OT to increase their independence and safety with adls and balance to allow discharge SNF. Pt demonstrates expressive deficits, cognitive deficits , and balance deficits affecting all adls. Pt will require incr level of care upon d/c.      Follow Up Recommendations  SNF;Supervision/Assistance - 24 hour    Equipment Recommendations   (defer SNF)    Recommendations for Other Services       Precautions / Restrictions Precautions Precautions: Fall      Mobility Bed Mobility Overal bed mobility: Needs Assistance Bed Mobility: Rolling;Sidelying to Sit Rolling: Max assist Sidelying to sit: Max assist       General bed mobility comments: on EOB with PT  Transfers Overall transfer level: Needs assistance Equipment used: 2 person hand held assist (face to face with chuck pad and gait belt x2) Transfers: Sit to/from Stand Sit to Stand: Max assist;+2 physical assistance Stand pivot transfers: Max assist;+2 physical assistance       General transfer comment: Pt unable to power up into static standing and requires R LE blocked to transfer to 3n1. Pt unable to sustain  static standing    Balance Overall balance assessment: Needs assistance Sitting-balance support: Single extremity supported Sitting balance-Leahy Scale: Poor Sitting balance - Comments: able to maintain balance for brief periods without assist, but could not self correct and demonstrates inability to maintain balance for functional period of time without assist. LOB posteriorly and to the left Postural control: Right lateral lean;Posterior lean Standing balance support: During functional activity Standing balance-Leahy Scale: Zero                              ADL Overall ADL's : Needs assistance/impaired     Grooming: Moderate assistance;Sitting                   Toilet Transfer: +2 for physical assistance;Total assistance;Squat-pivot;BSC Toilet Transfer Details (indicate cue type and reason): Pt requires (A) to power up and to static standing. Pt with no active attempt to transfer to 3n1. Pt voiding bladder once on 3n1 Toileting- Clothing Manipulation and Hygiene: Total assistance;+2 for physical assistance;+2 for safety/equipment;Sit to/from stand         General ADL Comments: Pt sitting EOB with PT on arrival. pt with (A) to maintain static sitting balance at eob from PT. Pt with noted R side weakness. Pt with R LE blocked and transferred to 3n1. pt unabel to shift weight to scoot back on 3n1. pt transferred 3n1 to chair. OT / PT shifting weight in static standing onto L LE and OT picking up R LE to place for transfer. pt with no activation of R LE during transfer. Pt positioned in chair with pillows. Pt  reports "no" to pain question and closing eyes once in chair appearing to attempt to rest. Pt with expressive deficits noted ( slurred speech)      Vision Additional Comments: deficit to full assess due to cognition   Perception     Praxis      Pertinent Vitals/Pain Pain Assessment: No/denies pain     Hand Dominance Right   Extremity/Trunk Assessment  Upper Extremity Assessment Upper Extremity Assessment: RUE deficits/detail;LUE deficits/detail RUE Deficits / Details: demonstrates trace grasp with OT but when MD entered pt demonstrates a 3- out 5 gross grasp. difficult to assess due to cognition RUE Sensation:  (responds to nail bed pressure) LUE Deficits / Details: 4 out 5 demonstrates grasp   Lower Extremity Assessment Lower Extremity Assessment: Defer to PT evaluation;LLE deficits/detail LLE Deficits / Details: foot supinated sitting on EOB    Cervical / Trunk Assessment Cervical / Trunk Assessment: Kyphotic   Communication Communication Communication: Expressive difficulties   Cognition Arousal/Alertness: Awake/alert Behavior During Therapy: WFL for tasks assessed/performed Overall Cognitive Status: Impaired/Different from baseline Area of Impairment: Following commands       Following Commands: Follows one step commands inconsistently       General Comments: Pt verbalized name but x3 no response to DOB question. pt with simple one word responses and request. "Water" "no" Pt very minimal responses to questions. Pt responds "ouch" with nail bed pressure on R side UE / LE   General Comments       Exercises       Shoulder Instructions      Home Living Family/patient expects to be discharged to:: Assisted living                             Home Equipment: Walker - 4 wheels          Prior Functioning/Environment Level of Independence: Independent with assistive device(s)             OT Diagnosis: Generalized weakness;Cognitive deficits   OT Problem List: Decreased strength;Decreased activity tolerance;Impaired balance (sitting and/or standing);Decreased cognition;Decreased safety awareness;Decreased knowledge of use of DME or AE;Decreased knowledge of precautions;Impaired UE functional use   OT Treatment/Interventions: Self-care/ADL training;Therapeutic exercise;Neuromuscular education;DME  and/or AE instruction;Therapeutic activities;Cognitive remediation/compensation;Patient/family education;Balance training    OT Goals(Current goals can be found in the care plan section) Acute Rehab OT Goals Patient Stated Goal: unable to state OT Goal Formulation: Patient unable to participate in goal setting Time For Goal Achievement: 03/03/15 Potential to Achieve Goals: Good  OT Frequency: Min 2X/week   Barriers to D/C: Decreased caregiver support  [facility]       Co-evaluation PT/OT/SLP Co-Evaluation/Treatment: Yes Reason for Co-Treatment: Complexity of the patient's impairments (multi-system involvement);For patient/therapist safety PT goals addressed during session: Mobility/safety with mobility;Balance OT goals addressed during session: ADL's and self-care      End of Session Equipment Utilized During Treatment: Gait belt Nurse Communication: Mobility status;Precautions  Activity Tolerance: Patient tolerated treatment well Patient left: in chair;with call bell/phone within reach;with chair alarm set   Time: 0800-0822 OT Time Calculation (min): 22 min Charges:  OT General Charges $OT Visit: 1 Procedure OT Evaluation $Initial OT Evaluation Tier I: 1 Procedure G-Codes:    Harolyn RutherfordJones, Santiana Glidden B 02/24/2015, 8:47 AM  Pager: [phone]

## 2015-02-24 NOTE — Evaluation (Signed)
Physical Therapy Evaluation Patient Details Name: Alice Swanson MRN: [id number] DOB: 1919-11-04 Today's Date: 02/24/2015   History of Present Illness  81 y.o. female with a PMHx of hyperlipidemia, HTN, TIA, cerebrovascular disease, and osteoarthritis who presents after being found to have trouble difficulty speaking and walking. MRI revealed patchy multi focal ischemic infarcts within the pons extending inferiorly towards the brainstem, with additional subcentimeter infarcts involving the right cerebellar hemisphere, and left PCA territory.  Clinical Impression  Patient demonstrates deficits in functional mobility as indicated below. Will need continued skilled PT to address deficits and maximize function. Will see as indicated and progress as tolerated. Will need SNF upon acute discharge.     Follow Up Recommendations SNF;Supervision/Assistance - 24 hour    Equipment Recommendations  Other (comment) (tbd)    Recommendations for Other Services       Precautions / Restrictions Precautions Precautions: Fall      Mobility  Bed Mobility Overal bed mobility: Needs Assistance Bed Mobility: Rolling;Sidelying to Sit Rolling: Max assist Sidelying to sit: Max assist       General bed mobility comments: Patient very lethargic upon coming to EOB, max assist to initiate movement and elevate to EOB  Transfers Overall transfer level: Needs assistance Equipment used: 2 person hand held assist (face to face with chuck pad and gait belt x2) Transfers: Sit to/from Stand Sit to Stand: Max assist;+2 physical assistance Stand pivot transfers: Max assist;+2 physical assistance       General transfer comment: Pt unable to power up into static standing and requires R LE blocked to transfer to 3n1. Pt unable to sustain static standing  Ambulation/Gait                Stairs            Wheelchair Mobility    Modified Rankin (Stroke Patients Only) Modified Rankin (Stroke Patients  Only) Pre-Morbid Rankin Score: Moderate disability Modified Rankin: Severe disability     Balance Overall balance assessment: Needs assistance Sitting-balance support: Single extremity supported Sitting balance-Leahy Scale: Poor Sitting balance - Comments: able to maintain balance for brief periods without assist, but could not self correct and demonstrates inability to maintain balance for functional period of time without assist. LOB posteriorly and to the left Postural control: Posterior lean;Left lateral lean (L lean when LUE not supporting in upright at EOB; posterior ) Standing balance support: During functional activity Standing balance-Leahy Scale: Zero                               Pertinent Vitals/Pain Pain Assessment: No/denies pain    Home Living Family/patient expects to be discharged to:: Assisted living               Home Equipment: Walker - 4 wheels      Prior Function Level of Independence: Independent with assistive device(s)               Hand Dominance   Dominant Hand: Right    Extremity/Trunk Assessment   Upper Extremity Assessment: RUE deficits/detail;LUE deficits/detail RUE Deficits / Details: demonstrates trace grasp with OT but when MD entered pt demonstrates a 3- out 5 gross grasp. difficult to assess due to cognition   RUE Sensation:  (responds to nail bed pressure) LUE Deficits / Details: 4 out 5 demonstrates grasp   Lower Extremity Assessment: RLE deficits/detail;LLE deficits/detail;Difficult to assess due to impaired cognition RLE Deficits / Details: RLE  2/5 strength gross motions, decreased dorsiflexion 1+/5 LLE Deficits / Details: foot supinated sitting on EOB   Cervical / Trunk Assessment: Kyphotic  Communication   Communication: Expressive difficulties  Cognition Arousal/Alertness: Awake/alert Behavior During Therapy: WFL for tasks assessed/performed Overall Cognitive Status: Impaired/Different from  baseline Area of Impairment: Following commands       Following Commands: Follows one step commands inconsistently       General Comments: Pt verbalized name but x3 no response to DOB question. pt with simple one word responses and request. "Water" "no" Pt very minimal responses to questions. Pt responds "ouch" with nail bed pressure on R side UE / LE    General Comments      Exercises        Assessment/Plan    PT Assessment Patient needs continued PT services  PT Diagnosis Difficulty walking;Generalized weakness;Hemiplegia dominant side;Altered mental status   PT Problem List Decreased strength;Decreased range of motion;Decreased activity tolerance;Decreased balance;Decreased mobility;Decreased coordination;Decreased cognition  PT Treatment Interventions DME instruction;Gait training;Functional mobility training;Therapeutic activities;Therapeutic exercise;Balance training;Patient/family education   PT Goals (Current goals can be found in the Care Plan section) Acute Rehab PT Goals Patient Stated Goal: none stated PT Goal Formulation: Patient unable to participate in goal setting Time For Goal Achievement: 03/10/15 Potential to Achieve Goals: Fair    Frequency Min 3X/week   Barriers to discharge        Co-evaluation PT/OT/SLP Co-Evaluation/Treatment: Yes Reason for Co-Treatment: Complexity of the patient's impairments (multi-system involvement);For patient/therapist safety PT goals addressed during session: Mobility/safety with mobility;Balance OT goals addressed during session: ADL's and self-care       End of Session Equipment Utilized During Treatment: Gait belt Activity Tolerance: Patient tolerated treatment well;Patient limited by fatigue Patient left: in chair;with call bell/phone within reach;with chair alarm set Nurse Communication: Mobility status         Time: [id number] PT Time Calculation (min) (ACUTE ONLY): 34 min   Charges:   PT  Evaluation $Initial PT Evaluation Tier I: 1 Procedure     PT G CodesFabio Asa:        [name] 02/24/2015, 10:08 AM [name], PT DPT  [phone]

## 2015-02-24 NOTE — Progress Notes (Signed)
[facility]                                                                              Patient Demographics  Alice Swanson, is a 81 y.o. female, DOB - 03-Sep-1920, ZOX:[id number]  Admit date - 02/23/2015   Admitting Physician Hillary Bow, DO  Outpatient Primary MD for the patient is GREEN, Lenon Curt, MD  LOS - 1   Chief Complaint  Patient presents with  . Stroke Symptoms     HPI on 02/23/2015 by Dr. Lyda Perone Alice Swanson is a 81 y.o. female h/o cerebrovascular disease with TIA and cerebellar infarction last November, patient presents to the ED with aphasia. Patient developed symptoms of abnormal gait, weakness, and aphasia 7 hours PTA at about 6:00 PM yesterday evening. She is typically able to ambulate on her own and speak fluently. Facility reports abnormal speech as well as urinary incontinence at shift change at midnight.  Interim history Patient did have an acute CVA on MRI. Echocardiogram still pending. PT and OT recommended. Patient failed her swallow eval and MBS. She will likely need a PEG tube. Suspected daughter regarding this. Neurology recommended aspirin and Plavix for 3 months followed by just Lasix alone once a patient is able to take pills. Also recommending keeping her blood pressure a little higher due to posterior circulation issues.  Assessment & Plan   Acute CVA, aphasia -Patient did have recent CVA November 2015 -CT head: No acute findings -MRI brain: Patchy multifocal ischemic infarcts in the pons extending inferiorly towards the brainstem, subcentimeter infarcts involving right cerebellar hemisphere, left PCA territory -Echocardiogram: Pending -Carotid doppler: 1-39% ICA stenosis, vertebral artery flow is antegrade -LDL 159, needs statin  -hemoglobin A1c 5.5 -PT/OT recommended SNF -Neurology consulted and appreciated, recommended aspirin and Plavix for 3 months then Plavix alone. Also recommended keeping patient's blood pressure a little higher  due to posterior circulation issues. -Continue aspirin rectally -Patient failed her modified barium swallow and is still nothing by mouth. Spoke to patient's daughter regarding PEG tube placement and code status.  Will change code status to DNR.  Daughter would like to speak with her sister before making a decision about peg.   Hypertension -Allow for permissive hypertension  Hyperlipidemia -Lipid panel: TC 240, TG 134, HDL 54, LDL 159 -Lipitor ordered, however, patient is still NPO after MBS  History of coronary artery disease -currently chest pain-free -troponin negative  Code Status: DNR (spoke with patient's daughter Alice Swanson who comfirmed)  Family Communication: None at bedside, daughter via phone  Disposition Plan:Admitted, will need SNF.  SW consulted.  Likely will need PEG.  Time Spent in minutes   30 minutes  Procedures  Carotid doppler  Consults   Neurology  DVT Prophylaxis   heparin  Lab Results  Component Value Date   PLT 179 02/23/2015    Medications  Scheduled Meds: . aspirin  300 mg Rectal Daily   Or  . aspirin  325 mg Oral Daily  . clopidogrel  75 mg Oral Daily  . dorzolamide  1 drop Both Eyes QHS  . erythromycin  1 application Both Eyes QHS  . heparin  5,000 Units Subcutaneous 3 times  per day  . timolol  1 drop Both Eyes QHS   Continuous Infusions: . sodium chloride 75 mL/hr at 02/24/15 0713   PRN Meds:.  Antibiotics     Anti-infectives    None        Subjective:   Alice Swanson seen and examined today.  Patient less interactive this morning.  Can answer simple questions with one word answers.  Denies pain, chest pain, shortness of breath, abdominal pain.  Would like water.    Objective:   Filed Vitals:   02/24/15 0152 02/24/15 0607 02/24/15 0951 02/24/15 1141  BP: 143/71 157/78 184/58 185/59  Pulse: 76 56 59 53  Temp: 98.5 F (36.9 C) 99.8 F (37.7 C) 98 F (36.7 C)   TempSrc: Oral Oral Oral   Resp: Height:       Weight:      SpO2: 92% 98% 98% 97%    Wt Readings from Last 3 Encounters:  02/23/15 57.4 kg (126 lb 8.7 oz)  02/05/15 60.782 kg (134 lb)  01/22/15 59.875 kg (132 lb)     Intake/Output Summary (Last 24 hours) at 02/24/15 1151 Last data filed at 02/23/15 1657  Gross per 24 hour  Intake      0 ml  Output      0 ml  Net      0 ml    Exam  General: Well developed, well nourished, no distress, somnolent  HEENT: NCAT, PERRLA, EOMI, Anicteic Sclera, mucous membranes moist.   Cardiovascular: S1 S2 auscultated, Irregular   Respiratory: Clear to auscultation bilaterally with equal chest rise  Abdomen: Soft, nontender, nondistended, + bowel sounds  Extremities: warm dry without cyanosis clubbing or edema  Neuro: More somnolent, AAO, weaker on the right side, decreased hand grip.  Data Review   Micro Results No results found for this or any previous visit (from the past 240 hour(s)).  Radiology Reports Ct Head Wo Contrast  02/23/2015   CLINICAL DATA:  Bilateral lower extremity weakness  EXAM: CT HEAD WITHOUT CONTRAST  TECHNIQUE: Contiguous axial images were obtained from the base of the skull through the vertex without intravenous contrast.  COMPARISON:  09/01/2014, 08/31/2014.  FINDINGS: There is no intracranial hemorrhage, mass or evidence of acute infarction. There is no extra-axial fluid collection. There is moderately severe generalized atrophy and hemispheric hypodensity consistent with chronic small vessel ischemic disease. There is prior lacunar infarction in the left caudate head. No acute bony abnormalities are evident. Incidentally noted right frontal sinus osteoma.  IMPRESSION: Moderately severe generalized atrophy and chronic small vessel ischemic disease. No acute findings.   Electronically Signed   By: Ellery Plunk M.D.   On: 02/23/2015 01:35   Mr Brain Wo Contrast  02/23/2015   CLINICAL DATA:  Initial evaluation for acute aphasia, dysarthria. Gait instability.   EXAM: MRI HEAD WITHOUT CONTRAST  MRA HEAD WITHOUT CONTRAST  TECHNIQUE: Multiplanar, multiecho pulse sequences of the brain and surrounding structures were obtained without intravenous contrast. Angiographic images of the head were obtained using MRA technique without contrast.  COMPARISON:  Prior CT from earlier the same day as well as previous MRI from 09/01/2014.  FINDINGS: MRI HEAD FINDINGS  Diffuse prominence of the CSF containing spaces is compatible with generalized cerebral atrophy. Extensive patchy and confluent T2/FLAIR hyperintensity within the periventricular and deep white matter both cerebral hemispheres again seen, most compatible with advanced chronic microvascular ischemic disease. Similar changes seen within the pons.  There are patchy  multi focal ischemic infarcts involving the central pons, with some extension inferiorly towards the upper brainstem (series 3, image 15). Additional multi focal subcentimeter ischemic infarcts involving the right cerebellar hemisphere (series 3, image 13). A in 7 mm mildly hyperintense focus of restricted diffusion within the posterior right middle cerebellar peduncle appears more subacute in nature (series 3, image 11). Additional patchy subcentimeter ischemic infarcts within the medial left occipital lobe as well as the mid and left aspect of the splenium in the left PCA territory (series 3, image 24 image 18, 24). There is question of small subacute ischemic infarct within the left cerebellar hemisphere as well (series 3, image 9), as well as the left thalamus (series 3, image 25). There is question of tiny associated petechial hemorrhage within the central pons (series 9, image 8). No hemorrhagic transformation. No other chronic or acute intracranial hemorrhage. Absent flow void within the distal vertebral arteries and proximal -mid basilar artery.  No mass lesion or midline shift.  No extra-axial fluid collection.  Craniocervical junction within normal limits.  Scattered multilevel degenerative changes present within the visualized upper cervical spine. Pituitary gland normal.  No acute abnormality about the orbits.  Scattered mild mucoperiosteal thickening present within the ethmoidal air cells and maxillary sinuses. No mastoid effusion. Inner ear structures normal.  Bone marrow signal intensity normal. Scalp soft tissues unremarkable.  MRA HEAD FINDINGS  ANTERIOR CIRCULATION:  Visualized distal cervical segments of the internal carotid arteries are patent with antegrade flow. The petrous, cavernous, and supra clinoid segments are patent bilaterally. A1 segments are patent but demonstrate multi focal atheromatous irregularity. Multi focal air atheromatous irregularity present within the anterior cerebral arteries is well which are opacified to their distal aspects. There are superimposed moderate to severe multi focal stenoses within the left A2 segment.  M1 segments irregular but patent. Focal moderate stenosis within the right M1 segment noted. Irregular somewhat more prevalent within the right M1 segment. Distal MCA branches well opacified.  POSTERIOR CIRCULATION:  Flow was seen within the distal vertebral arteries within the upper neck. There is essentially absent flow within the distal V4 segments bilaterally as they course into the cranial vault. Posterior inferior cerebral arteries not well opacified. There is little to no flow within the basilar artery as well. Posterior cerebral arteries not definitely visualized. Superior cerebellar arteries not visualized either. This is new relative to most recent MRA from 09/01/2014.  No aneurysm.  IMPRESSION: MRI HEAD IMPRESSION:  1. Patchy multi focal ischemic infarcts within the pons extending inferiorly towards the brainstem, with additional subcentimeter infarcts involving the right cerebellar hemisphere, and left PCA territory. There are probable more subacute infarcts within the right cerebellar hemisphere as well as  the left cerebellar hemisphere and left thalamus. No significant mass effect. 2. Advanced cerebral atrophy with chronic microvascular ischemic disease.  MRA HEAD IMPRESSION:  1. Essentially absent flow within the distal vertebral arteries as they course into the cranial vault with absent flow within the basilar artery and its branches. This is new relative to prior study. 2. Patent anterior circulation. There are moderate to severe multi focal atheromatous stenoses within the left A2 segment. Moderate short-segment stenosis within the mid right M1 segment. Multi focal atheromatous irregularity within the distal MCA branches. Results were called by telephone at the time of interpretation on 02/23/2015 at 6:30 am to Dr. Lyda PeroneJARED GARDNER , who verbally acknowledged these results.   Electronically Signed   By: Rise MuBenjamin  McClintock M.D.   On: 02/23/2015 06:42  Mr Maxine Glenn Head/brain Wo Cm  02/23/2015   CLINICAL DATA:  Initial evaluation for acute aphasia, dysarthria. Gait instability.  EXAM: MRI HEAD WITHOUT CONTRAST  MRA HEAD WITHOUT CONTRAST  TECHNIQUE: Multiplanar, multiecho pulse sequences of the brain and surrounding structures were obtained without intravenous contrast. Angiographic images of the head were obtained using MRA technique without contrast.  COMPARISON:  Prior CT from earlier the same day as well as previous MRI from 09/01/2014.  FINDINGS: MRI HEAD FINDINGS  Diffuse prominence of the CSF containing spaces is compatible with generalized cerebral atrophy. Extensive patchy and confluent T2/FLAIR hyperintensity within the periventricular and deep white matter both cerebral hemispheres again seen, most compatible with advanced chronic microvascular ischemic disease. Similar changes seen within the pons.  There are patchy multi focal ischemic infarcts involving the central pons, with some extension inferiorly towards the upper brainstem (series 3, image 15). Additional multi focal subcentimeter ischemic infarcts  involving the right cerebellar hemisphere (series 3, image 13). A in 7 mm mildly hyperintense focus of restricted diffusion within the posterior right middle cerebellar peduncle appears more subacute in nature (series 3, image 11). Additional patchy subcentimeter ischemic infarcts within the medial left occipital lobe as well as the mid and left aspect of the splenium in the left PCA territory (series 3, image 24 image 18, 24). There is question of small subacute ischemic infarct within the left cerebellar hemisphere as well (series 3, image 9), as well as the left thalamus (series 3, image 25). There is question of tiny associated petechial hemorrhage within the central pons (series 9, image 8). No hemorrhagic transformation. No other chronic or acute intracranial hemorrhage. Absent flow void within the distal vertebral arteries and proximal -mid basilar artery.  No mass lesion or midline shift.  No extra-axial fluid collection.  Craniocervical junction within normal limits. Scattered multilevel degenerative changes present within the visualized upper cervical spine. Pituitary gland normal.  No acute abnormality about the orbits.  Scattered mild mucoperiosteal thickening present within the ethmoidal air cells and maxillary sinuses. No mastoid effusion. Inner ear structures normal.  Bone marrow signal intensity normal. Scalp soft tissues unremarkable.  MRA HEAD FINDINGS  ANTERIOR CIRCULATION:  Visualized distal cervical segments of the internal carotid arteries are patent with antegrade flow. The petrous, cavernous, and supra clinoid segments are patent bilaterally. A1 segments are patent but demonstrate multi focal atheromatous irregularity. Multi focal air atheromatous irregularity present within the anterior cerebral arteries is well which are opacified to their distal aspects. There are superimposed moderate to severe multi focal stenoses within the left A2 segment.  M1 segments irregular but patent. Focal  moderate stenosis within the right M1 segment noted. Irregular somewhat more prevalent within the right M1 segment. Distal MCA branches well opacified.  POSTERIOR CIRCULATION:  Flow was seen within the distal vertebral arteries within the upper neck. There is essentially absent flow within the distal V4 segments bilaterally as they course into the cranial vault. Posterior inferior cerebral arteries not well opacified. There is little to no flow within the basilar artery as well. Posterior cerebral arteries not definitely visualized. Superior cerebellar arteries not visualized either. This is new relative to most recent MRA from 09/01/2014.  No aneurysm.  IMPRESSION: MRI HEAD IMPRESSION:  1. Patchy multi focal ischemic infarcts within the pons extending inferiorly towards the brainstem, with additional subcentimeter infarcts involving the right cerebellar hemisphere, and left PCA territory. There are probable more subacute infarcts within the right cerebellar hemisphere as well as the left cerebellar  hemisphere and left thalamus. No significant mass effect. 2. Advanced cerebral atrophy with chronic microvascular ischemic disease.  MRA HEAD IMPRESSION:  1. Essentially absent flow within the distal vertebral arteries as they course into the cranial vault with absent flow within the basilar artery and its branches. This is new relative to prior study. 2. Patent anterior circulation. There are moderate to severe multi focal atheromatous stenoses within the left A2 segment. Moderate short-segment stenosis within the mid right M1 segment. Multi focal atheromatous irregularity within the distal MCA branches. Results were called by telephone at the time of interpretation on 02/23/2015 at 6:30 am to Dr. Lyda PeroneJARED GARDNER , who verbally acknowledged these results.   Electronically Signed   By: Rise MuBenjamin  McClintock M.D.   On: 02/23/2015 06:42    CBC  Recent Labs Lab 02/23/15 0121 02/23/15 0125  WBC 10.0  --   HGB 14.1 15.0    HCT 42.3 44.0  PLT 179  --   MCV 93.4  --   MCH 31.1  --   MCHC 33.3  --   RDW 14.1  --   LYMPHSABS 2.3  --   MONOABS 0.7  --   EOSABS 0.2  --   BASOSABS 0.0  --     Chemistries   Recent Labs Lab 02/23/15 0121 02/23/15 0125  NA 139 143  K 3.6 3.6  CL 110 108  CO2 21*  --   GLUCOSE 127* 127*  BUN 19 21*  CREATININE 0.81 0.70  CALCIUM 8.9  --   AST 20  --   ALT 10*  --   ALKPHOS 80  --   BILITOT 1.0  --    ------------------------------------------------------------------------------------------------------------------ estimated creatinine clearance is 35.6 mL/min (by C-G formula based on Cr of 0.7). ------------------------------------------------------------------------------------------------------------------  Recent Labs  02/23/15 0630  HGBA1C 5.5   ------------------------------------------------------------------------------------------------------------------  Recent Labs  02/23/15 0630  CHOL 240*  HDL 54  LDLCALC 159*  TRIG 134  CHOLHDL 4.4   ------------------------------------------------------------------------------------------------------------------ No results for input(s): TSH, T4TOTAL, T3FREE, THYROIDAB in the last 72 hours.  Invalid input(s): FREET3 ------------------------------------------------------------------------------------------------------------------ No results for input(s): VITAMINB12, FOLATE, FERRITIN, TIBC, IRON, RETICCTPCT in the last 72 hours.  Coagulation profile  Recent Labs Lab 02/23/15 0121  INR 1.03    No results for input(s): DDIMER in the last 72 hours.  Cardiac Enzymes No results for input(s): CKMB, TROPONINI, MYOGLOBIN in the last 168 hours.  Invalid input(s): CK ------------------------------------------------------------------------------------------------------------------ Invalid input(s): POCBNP    Byran Bilotti D.O. on 02/24/2015 at 11:51 AM  Between 7am to 7pm - Pager -  [phone]  After 7pm go to [email or web address] - password [email or web address]  And look for the night coverage person covering for me after hours  [facility] Office  [phone]

## 2015-02-24 NOTE — Procedures (Deleted)
Objective Swallowing Evaluation:  (MBS)  Patient Details  Name: Alice Swanson MRN: [id number] Date of Birth: [birth year]  Today's Date: 02/24/2015 Time: SLP Start Time (ACUTE ONLY): 1015-SLP Stop Time (ACUTE ONLY): 1030 SLP Time Calculation (min) (ACUTE ONLY): 15 min  Past Medical History:  Past Medical History  Diagnosis Date  . Arthritis   . Depression   . Glaucoma     both eyes  . Hyperlipidemia   . Hypertension   . Cardiac arrhythmia due to congenital heart disease   . Sciatica   . Anxiety   . Allergic rhinitis   . Macular degeneration of both eyes   . TIA (transient ischemic attack) 08/31/14  . Bradycardia 08/31/14  . Heart block AV first degree 08/31/14  . Memory deficit 08/31/14  . Cerebral atrophy 08/31/14  . Cerebrovascular disease 08/31/14  . Cerebellar infarction 08/31/14  . Osteoarthritis 08/31/14  . Hypokalemia    Past Surgical History:  Past Surgical History  Procedure Laterality Date  . Tonsillectomy and adenoidectomy  1926  . Appendectomy  1932  . Abdominal hysterectomy  1962  . Cataract extraction w/ intraocular lens implant Right 12/2011    Dr. Pricilla Holmucker  . Cataract extraction w/ intraocular lens implant Left 02/2013    Dr. Dione BoozeGroat  . Colonoscopy  2005   HPI:  Other Pertinent Information: Alice Baltimoremily Krehbiel is an 81 y.o. female with a past medical history significant for HTN, hyperlipidemia, MI, TIA, ischemic cerebellar infarct 11/15, OA, brought in for further evaluation of language impairment. [facility] and at baseline ambulates with a walker. Her last known well is not really known, but apparently patient developed bilateral leg weakness and language impairment that motivated call to EMS due to concern for stroke. Patient said that she is aware of having trouble talking and "my right leg getting weaker" since 5/8. Daughter and patient both report recent decline in her health, definitely worse past 6 months. MRI shows patchy pontine and brainstem  infartcs, bilateral cerebellar infarcts.  MBS being completed today to determine appropriateness for po intake.  No Data Recorded  Assessment / Plan / Recommendation No flowsheet data found.    CHL IP TREATMENT RECOMMENDATION 02/24/2015  Treatment Recommendations Therapy as outlined in treatment plan below     CHL IP DIET RECOMMENDATION 02/24/2015  SLP Diet Recommendations Alternative means - temporary;NPO  Liquid Administration via (None)  Medication Administration Via alternative means  Compensations (None)  Postural Changes and/or Swallow Maneuvers (None)     CHL IP OTHER RECOMMENDATIONS 02/24/2015  Recommended Consults (None)  Oral Care Recommendations Oral care QID  Other Recommendations Remove water pitcher;Have oral suction available     CHL IP FOLLOW UP RECOMMENDATIONS 09/02/2014  Follow up Recommendations None     CHL IP FREQUENCY AND DURATION 02/24/2015  Speech Therapy Frequency (ACUTE ONLY) (None)  Treatment Duration 2 weeks     Pertinent Vitals/Pain VSS, no pain evident   SLP Swallow Goals No flowsheet data found.  No flowsheet data found.    CHL IP REASON FOR REFERRAL 02/24/2015  Reason for Referral Objectively evaluate swallowing function     CHL IP ORAL PHASE 02/24/2015  Lips (None)  Tongue (None)  Mucous membranes (None)  Nutritional status (None)  Other (None)  Oxygen therapy (None)  Oral Phase Impaired  Oral - Pudding Teaspoon (None)  Oral - Pudding Cup (None)  Oral - Honey Teaspoon (None)  Oral - Honey Cup (None)  Oral - Honey Syringe (None)  Oral - Nectar  Teaspoon (None)  Oral - Nectar Cup (None)  Oral - Nectar Straw (None)  Oral - Nectar Syringe (None)  Oral - Ice Chips (None)  Oral - Thin Teaspoon (None)  Oral - Thin Cup (None)  Oral - Thin Straw (None)  Oral - Thin Syringe (None)  Oral - Puree (None)  Oral - Mechanical Soft (None)  Oral - Regular (None)  Oral - Multi-consistency (None)  Oral - Pill (None)  Oral Phase - Comment (None)       CHL IP PHARYNGEAL PHASE 02/24/2015  Pharyngeal Phase Impaired  Pharyngeal - Pudding Teaspoon (None)  Penetration/Aspiration details (pudding teaspoon) (None)  Pharyngeal - Pudding Cup (None)  Penetration/Aspiration details (pudding cup) (None)  Pharyngeal - Honey Teaspoon (None)  Penetration/Aspiration details (honey teaspoon) (None)  Pharyngeal - Honey Cup (None)  Penetration/Aspiration details (honey cup) (None)  Pharyngeal - Honey Syringe (None)  Penetration/Aspiration details (honey syringe) (None)  Pharyngeal - Nectar Teaspoon (None)  Penetration/Aspiration details (nectar teaspoon) (None)  Pharyngeal - Nectar Cup (None)  Penetration/Aspiration details (nectar cup) (None)  Pharyngeal - Nectar Straw (None)  Penetration/Aspiration details (nectar straw) (None)  Pharyngeal - Nectar Syringe (None)  Penetration/Aspiration details (nectar syringe) (None)  Pharyngeal - Ice Chips (None)  Penetration/Aspiration details (ice chips) (None)  Pharyngeal - Thin Teaspoon (None)  Penetration/Aspiration details (thin teaspoon) (None)  Pharyngeal - Thin Cup (None)  Penetration/Aspiration details (thin cup) (None)  Pharyngeal - Thin Straw (None)  Penetration/Aspiration details (thin straw) (None)  Pharyngeal - Thin Syringe (None)  Penetration/Aspiration details (thin syringe') (None)  Pharyngeal - Puree (None)  Penetration/Aspiration details (puree) (None)  Pharyngeal - Mechanical Soft (None)  Penetration/Aspiration details (mechanical soft) (None)  Pharyngeal - Regular (None)  Penetration/Aspiration details (regular) (None)  Pharyngeal - Multi-consistency (None)  Penetration/Aspiration details (multi-consistency) (None)  Pharyngeal - Pill (None)  Penetration/Aspiration details (pill) (None)  Pharyngeal Comment (None)      CHL IP CERVICAL ESOPHAGEAL PHASE 02/24/2015  Cervical Esophageal Phase WFL  Pudding Teaspoon (None)  Pudding Cup (None)  Honey Teaspoon (None)  Honey Cup  (None)  Honey Straw (None)  Nectar Teaspoon (None)  Nectar Cup (None)  Nectar Straw (None)  Nectar Sippy Cup (None)  Thin Teaspoon (None)  Thin Cup (None)  Thin Straw (None)  Thin Sippy Cup (None)  Cervical Esophageal Comment (None)    CHL IP GO 09/02/2014  Functional Assessment Tool Used expressive communication clinical judgement  Functional Limitations Spoken language expressive  Swallow Current Status [phone](G8996) (None)  Swallow Goal Status (U0454(G8997) (None)  Swallow Discharge Status (U9811(G8998) (None)  Motor Speech Current Status (B1478(G8999) (None)  Motor Speech Goal Status (G9562(G9186) (None)  Motor Speech Goal Status (Z3086(G9158) (None)  Spoken Language Comprehension Current Status (V7846(G9159) (None)  Spoken Language Comprehension Goal Status (N6295(G9160) (None)  Spoken Language Comprehension Discharge Status [phone](G9161) (None)  Spoken Language Expression Current Status (K4401(G9162) CI  Spoken Language Expression Goal Status (U2725(G9163) CI  Spoken Language Expression Discharge Status (D6644(G9164) CI  Attention Current Status (I3474(G9165) (None)  Attention Goal Status (Q5956(G9166) (None)  Attention Discharge Status (L8756(G9167) (None)  Memory Current Status (E3329(G9168) (None)  Memory Goal Status (J1884(G9169) (None)  Memory Discharge Status (Z6606(G9170) (None)  Voice Current Status (T0160(G9171) (None)  Voice Goal Status (F0932(G9172) (None)  Voice Discharge Status (T5573(G9173) (None)  Other Speech-Language Pathology Functional Limitation (U2025(G9174) (None)  Other Speech-Language Pathology Functional Limitation Goal Status (K2706(G9175) (None)  Other Speech-Language Pathology Functional Limitation Discharge Status [phone]([phone]) (None)   Celia B. Bueche, MSP, CCC-SLP [phone]  Leigh Aurora 02/24/2015, 10:55 AM

## 2015-02-25 DIAGNOSIS — R1314 Dysphagia, pharyngoesophageal phase: Secondary | ICD-10-CM | POA: Insufficient documentation

## 2015-02-25 DIAGNOSIS — Z515 Encounter for palliative care: Secondary | ICD-10-CM

## 2015-02-25 LAB — CBC
HEMATOCRIT: 41.5 % (ref 36.0–46.0)
Hemoglobin: 13.5 g/dL (ref 12.0–15.0)
MCH: 30.2 pg (ref 26.0–34.0)
MCHC: 32.5 g/dL (ref 30.0–36.0)
MCV: 92.8 fL (ref 78.0–100.0)
Platelets: 152 10*3/uL (ref 150–400)
RBC: 4.47 MIL/uL (ref 3.87–5.11)
RDW: 13.9 % (ref 11.5–15.5)
WBC: 7 10*3/uL (ref 4.0–10.5)

## 2015-02-25 LAB — BASIC METABOLIC PANEL
Anion gap: 10 (ref 5–15)
BUN: 18 mg/dL (ref 6–20)
CO2: 18 mmol/L — ABNORMAL LOW (ref 22–32)
Calcium: 8.5 mg/dL — ABNORMAL LOW (ref 8.9–10.3)
Chloride: 112 mmol/L — ABNORMAL HIGH (ref 101–111)
Creatinine, Ser: 0.67 mg/dL (ref 0.44–1.00)
GFR calc Af Amer: >60 mL/min (ref >60)
GFR calc non Af Amer: >60 mL/min (ref >60)
GLUCOSE: 80 mg/dL (ref 70–99)
POTASSIUM: 3 mmol/L — AB (ref 3.5–5.1)
Sodium: 140 mmol/L (ref 135–145)

## 2015-02-25 MED ORDER — ACETAMINOPHEN 325 MG PO TABS: 650.0000 mg | ORAL_TABLET | ORAL | Status: DC | PRN

## 2015-02-25 MED ORDER — POTASSIUM CHLORIDE 10 MEQ/100ML IV SOLN
10.0000 meq | INTRAVENOUS | Status: AC
  Administered 2015-02-25 (×4): 10 meq via INTRAVENOUS
  Filled 2015-02-25 (×4): qty 100

## 2015-02-25 MED ORDER — ACETAMINOPHEN 650 MG RE SUPP
650.0000 mg | RECTAL | Status: DC | PRN
  Administered 2015-02-25: 650 mg via RECTAL
  Filled 2015-02-25: qty 1

## 2015-02-25 NOTE — Progress Notes (Signed)
Paged MD regarding pts manual BP of 212/80.  10mg  Hydralazine given.  Pt symptomatic with a headache.  Order for Tylenol received.

## 2015-02-25 NOTE — Clinical Social Work Note (Signed)
Clinical Social Worker attempted to contact pt's dtr, Willette PaJulie Brown and left a message in reference to discharge planning [facility] SN[facility].   Patient is resident of [facility].  FL-2 on chart for MD signature.  Derenda FennelBashira Victorhugo Preis, MSW, LCSWA [phone] 02/25/2015 2:42 PM

## 2015-02-25 NOTE — Progress Notes (Signed)
Speech Language Pathology Treatment: Dysphagia;Cognitive-Linquistic  Patient Details Name: Alice Swanson MRN: [id number] DOB: [birth year] Today's Date: 02/25/2015 Time: [id number] SLP Time Calculation (min) (ACUTE ONLY): 27 min  Assessment / Plan / Recommendation Clinical Impression  Pt shows improvements with receptive/expressive language abilities as compared to evaluation on previous date, however continues to exhibit significant difficulty with swallowing. PO trials consisted of small amounts (<1 tsp) of nectar thick liquid with suspected delayed swallow with Mod cues from SLP, resulting in immediate and delayed coughing.   She required Mod-Max cues for following one-step commands throughout structured tasks, and was oriented to person independently. Binary choices were provided for orientation to location and city. Pt's daughter showed pictures of family members, who pt identified after given models. Expressive language continues to be affected by phonemic paraphasias, word-finding difficulty, and delayed initiation. Will continue to follow.   HPI Other Pertinent Information: Alice Swanson is an 81 y.o. female with a past medical history significant for HTN, hyperlipidemia, MI, TIA, ischemic cerebellar infarct 11/15, OA, brought in for further evaluation of language impairment. [facility] and at baseline ambulates with a walker. Her last known well is not really known, but apparently patient developed bilateral leg weakness and language impairment that motivated call to EMS due to concern for stroke. Patient said that she is aware of having trouble talking and "my right leg getting weaker" since 5/8. Daughter and patient both report recent decline in her health, definitely worse past 6 months. MRI shows patchy pontine and brainstem infartcs, bilateral cerebellar infarcts.    Pertinent Vitals Pain Assessment: No/denies pain  SLP Plan  Continue with current plan of care     Recommendations Diet recommendations: NPO Medication Administration: Via alternative means       Oral Care Recommendations: Oral care QID Follow up Recommendations: 24 hour supervision/assistance;Skilled Nursing facility Plan: Continue with current plan of care    Maxcine HamLaura Paiewonsky, M.A. CCC-SLP [phone]  Maxcine Hamaiewonsky, Mischelle Reeg 02/25/2015, 9:39 AM

## 2015-02-25 NOTE — Consult Note (Signed)
Patient Alice Swanson      DOB: 08-18-1920      NIO:[id number]     Consult Note from the Palliative Medicine Team at [facility] Requested by:  Dr Tyrell Antonio     PCP: Estill Dooms, MD [facility]     Phone [phone]  Assessment/Recommendations: 81 yo female with CVA resulting in aphasia, dysphagia. Palliative consult for GOC.   1.  Code Status: DNR  2. GOC:  Met today with daughter Almyra Free. Almyra Free states that prior to this event, had been having rapid functional and cognitive decline over the past 2 months at assisted living.  She was also starting to fall more.  Almyra Free has heard from Neurology and done research online about strokes. Knows that her mom has several factors that will make it difficult for her to recover including her age/declining baseline and some worsening in speech since admitted.  Also knows it can take several months to see potential recovery.  She struggles with making decision because her mom has always been indecisive. We talked to some of issues with PEG tubes and how this will not totally eliminate risk of aspiration, especially if not having long term improvement in swallowing.  We also talked about options for comfort feeding and comfort care going forward.  It is difficult for her to weigh QOL factors as she has been able to do much less of what she enjoys (especially after past 2 months).  However, seeing her great grandchildren is probably what gives her most joy currently.  Very worrisome that she may not be able to have meaningful interaction with them in future.  Almyra Free ways these decisions heavily and plans to talk with her sister in [place] about these options.  I have given her our contact information should she or her sister have additional questions.    3. Symptom Management:   1. Dysphagia- speech following. Spoke to these issus as above.  Almyra Free to talk with her sister tonight and hopefully come to decisions on PEG.     4. Psychosocial/Spiritual: From [place] originally. 2 daughters.  Recently moved to [place] to be closer to daughter Almyra Free.  Enjoys her great grandchildren, poetry, reading/writing.      Brief HPI: 81 yo female with PMHx of CVA/TIA, HTN, HLD who was admitted on 5/9 with aphasia, abnormal gait, weakness of bilateral lower ext. Workup here revealed acute CVA. This is second CVA in past 6 months.  At baseline was ambulatory with walker and spoke fluently. Now bedbound and nonverbal in acute post CVA setting.  Family having discussions with primary team about potential for PEG tube.  Palliative consulted to assist with goals of care. Patient shakes head no to pain, n/v, dyspnea.     PMH:  Past Medical History  Diagnosis Date  . Arthritis   . Depression   . Glaucoma     both eyes  . Hyperlipidemia   . Hypertension   . Cardiac arrhythmia due to congenital heart disease   . Sciatica   . Anxiety   . Allergic rhinitis   . Macular degeneration of both eyes   . TIA (transient ischemic attack) 08/31/14  . Bradycardia 08/31/14  . Heart block AV first degree 08/31/14  . Memory deficit 08/31/14  . Cerebral atrophy 08/31/14  . Cerebrovascular disease 08/31/14  . Cerebellar infarction 08/31/14  . Osteoarthritis 08/31/14  . Hypokalemia      PSH: Past Surgical History  Procedure Laterality Date  .  Tonsillectomy and adenoidectomy  1926  . Appendectomy  1932  . Abdominal hysterectomy  1962  . Cataract extraction w/ intraocular lens implant Right 12/2011    Dr. Berline Lopes  . Cataract extraction w/ intraocular lens implant Left 02/2013    Dr. Katy Fitch  . Colonoscopy  2005   I have reviewed the [place] and SH and  If appropriate update it with new information. Allergies  Allergen Reactions  . Penicillins Other (See Comments)    On MAR   Scheduled Meds: . aspirin  300 mg Rectal Daily   Or  . aspirin  325 mg Oral Daily  . clopidogrel  75 mg Oral Daily  . dorzolamide  1 drop Both Eyes QHS  .  erythromycin  1 application Both Eyes QHS  . heparin  5,000 Units Subcutaneous 3 times per day  . potassium chloride  10 mEq Intravenous Q1 Hr x 4  . timolol  1 drop Both Eyes QHS   Continuous Infusions: . sodium chloride 75 mL/hr at 02/24/15 2201   PRN Meds:.acetaminophen, hydrALAZINE    BP 177/75 mmHg  Pulse 60  Temp(Src) 98.8 F (37.1 C) (Oral)  Resp 16  Ht 5' (1.524 m)  Wt 57.4 kg (126 lb 8.7 oz)  BMI 24.71 kg/m2  SpO2 100%   PPS: 20   Intake/Output Summary (Last 24 hours) at 02/25/15 1340 Last data filed at 02/24/15 1900  Gross per 24 hour  Intake  487.5 ml  Output      0 ml  Net  487.5 ml  Physical Exam:  General: Alert, NAD HEENT:  [place], mmm Neck: supple CVS: RRR Ext: warm Neuro: unable to verbalize. Follows commands  Labs: CBC    Component Value Date/Time   WBC 7.0 02/25/2015 0406   WBC 7.0 01/20/2015   RBC 4.47 02/25/2015 0406   HGB 13.5 02/25/2015 0406   HCT 41.5 02/25/2015 0406   PLT 152 02/25/2015 0406   MCV 92.8 02/25/2015 0406   MCH 30.2 02/25/2015 0406   MCHC 32.5 02/25/2015 0406   RDW 13.9 02/25/2015 0406   LYMPHSABS 2.3 02/23/2015 0121   MONOABS 0.7 02/23/2015 0121   EOSABS 0.2 02/23/2015 0121   BASOSABS 0.0 02/23/2015 0121    BMET    Component Value Date/Time   NA 140 02/25/2015 0406   NA 142 01/20/2015   K 3.0* 02/25/2015 0406   CL 112* 02/25/2015 0406   CO2 18* 02/25/2015 0406   GLUCOSE 80 02/25/2015 0406   BUN 18 02/25/2015 0406   BUN 22* 01/20/2015   CREATININE 0.67 02/25/2015 0406   CREATININE 0.9 01/20/2015   CALCIUM 8.5* 02/25/2015 0406   GFRNONAA >60 02/25/2015 0406   GFRAA >60 02/25/2015 0406    CMP     Component Value Date/Time   NA 140 02/25/2015 0406   NA 142 01/20/2015   K 3.0* 02/25/2015 0406   CL 112* 02/25/2015 0406   CO2 18* 02/25/2015 0406   GLUCOSE 80 02/25/2015 0406   BUN 18 02/25/2015 0406   BUN 22* 01/20/2015   CREATININE 0.67 02/25/2015 0406   CREATININE 0.9 01/20/2015   CALCIUM 8.5*  02/25/2015 0406   PROT 6.8 02/23/2015 0121   ALBUMIN 3.5 02/23/2015 0121   AST 20 02/23/2015 0121   ALT 10* 02/23/2015 0121   ALKPHOS 80 02/23/2015 0121   BILITOT 1.0 02/23/2015 0121   GFRNONAA >60 02/25/2015 0406   GFRAA >60 02/25/2015 0406    5/9 IMPRESSION: MRI HEAD IMPRESSION:  1. Patchy multi  focal ischemic infarcts within the pons extending inferiorly towards the brainstem, with additional subcentimeter infarcts involving the right cerebellar hemisphere, and left PCA territory. There are probable more subacute infarcts within the right cerebellar hemisphere as well as the left cerebellar hemisphere and left thalamus. No significant mass effect. 2. Advanced cerebral atrophy with chronic microvascular ischemic disease.  MRA HEAD IMPRESSION:  1. Essentially absent flow within the distal vertebral arteries as they course into the cranial vault with absent flow within the basilar artery and its branches. This is new relative to prior study. 2. Patent anterior circulation. There are moderate to severe multi focal atheromatous stenoses within the left A2 segment. Moderate short-segment stenosis within the mid right M1 segment. Multi focal atheromatous irregularity within the distal MCA branches.    Total Time: 55 minutes Greater than 50%  of this time was spent counseling and coordinating care related to the above assessment and plan.  Doran Clay D.O. Palliative Medicine Team at [facility]  Pager: [phone] Team Phone: [phone]

## 2015-02-25 NOTE — Progress Notes (Signed)
[facility]                                                                              Patient Demographics  Alice Swanson, is a 81 y.o. female, DOB - 25-Nov-1919, WJX:[id number]  Admit date - 02/23/2015   Admitting Physician Hillary BowJared M Gardner, DO  Outpatient Primary MD for the patient is GREEN, Lenon CurtARTHUR G, MD  LOS - 2   Chief Complaint  Patient presents with  . Stroke Symptoms     HPI on 02/23/2015 by Dr. Lyda PeroneJared Gardner Alice Swanson is a 81 y.o. female h/o cerebrovascular disease with TIA and cerebellar infarction last November, patient presents to the ED with aphasia. Patient developed symptoms of abnormal gait, weakness, and aphasia 7 hours PTA at about 6:00 PM yesterday evening. She is typically able to ambulate on her own and speak fluently. Facility reports abnormal speech as well as urinary incontinence at shift change at midnight.  Interim history Patient did have an acute CVA on MRI. Echocardiogram still pending. PT and OT recommended. Patient failed her swallow eval and MBS. She will likely need a PEG tube. Suspected daughter regarding this. Neurology recommended aspirin and Plavix for 3 months followed by just Lasix alone once a patient is able to take pills. Also recommending keeping her blood pressure a little higher due to posterior circulation issues.  Assessment & Plan   Acute CVA, aphasia -Patient did have recent CVA November 2015 -CT head: No acute findings -MRI brain: Patchy multifocal ischemic infarcts in the pons extending inferiorly towards the brainstem, subcentimeter infarcts involving right cerebellar hemisphere, left PCA territory -Echocardiogram: Pending -Carotid doppler: 1-39% ICA stenosis, vertebral artery flow is antegrade -LDL 159, needs statin  -hemoglobin A1c 5.5 -PT/OT recommended SNF -Neurology consulted and appreciated, recommended aspirin and Plavix for 3 months then Plavix alone. Also recommended keeping patient's blood pressure a little higher  due to posterior circulation issues. -Continue aspirin rectally -Patient failed her modified barium swallow and is still nothing by mouth. Daughter has not make a decision regarding Peg tube. I spoke with Dr Roda ShuttersXu, trail with Ng tube wont change management, patient will need if family wishes Peg tube. I will consult palliative for goals of care.   Hypertension -Allow for permissive hypertension  Hyperlipidemia -Lipid panel: TC 240, TG 134, HDL 54, LDL 159 -Lipitor ordered, however, patient is still NPO after MBS -need statins.   History of coronary artery disease -currently chest pain-free -troponin negative  Code Status: DNR (spoke with patient's daughter Willette PaJulie Brown who comfirmed)  Family Communication:  daughter at bedside.   Disposition Plan:Admitted, will need SNF.   Time Spent in minutes   30 minutes  Procedures  Carotid doppler  Consults   Neurology  DVT Prophylaxis   heparin  Lab Results  Component Value Date   PLT 152 02/25/2015    Medications  Scheduled Meds: . aspirin  300 mg Rectal Daily   Or  . aspirin  325 mg Oral Daily  . clopidogrel  75 mg Oral Daily  . dorzolamide  1 drop Both Eyes QHS  . erythromycin  1 application Both Eyes QHS  . heparin  5,000 Units Subcutaneous 3 times per  day  . potassium chloride  10 mEq Intravenous Q1 Hr x 4  . timolol  1 drop Both Eyes QHS   Continuous Infusions: . sodium chloride 75 mL/hr at 02/24/15 2201   PRN Meds:.  Antibiotics     Anti-infectives    None        Subjective:   Alice Swanson seen and examined today.  She is alert this morning, say yes moving head.  Try to speak, speech dysarthric.   Objective:   Filed Vitals:   02/25/15 0258 02/25/15 0340 02/25/15 0603 02/25/15 1012  BP: 202/74 168/72 165/60 154/58  Pulse:   53 61  Temp:   97.5 F (36.4 C) 98.5 F (36.9 C)  TempSrc:   Axillary Axillary  Resp:   16 16  Height:      Weight:      SpO2:   98% 97%    Wt Readings from Last 3  Encounters:  02/23/15 57.4 kg (126 lb 8.7 oz)  02/05/15 60.782 kg (134 lb)  01/22/15 59.875 kg (132 lb)     Intake/Output Summary (Last 24 hours) at 02/25/15 1232 Last data filed at 02/24/15 1900  Gross per 24 hour  Intake  487.5 ml  Output      0 ml  Net  487.5 ml    Exam  General: Well developed, well nourished, no distress, alert, dysarthric, aphasic.   HEENT: NCAT, PERRLA, EOMI, Anicteic Sclera, mucous membranes moist.   Cardiovascular: S1 S2 auscultated, Irregular   Respiratory: Clear to auscultation bilaterally with equal chest rise  Abdomen: Soft, nontender, nondistended, + bowel sounds  Extremities: warm dry without cyanosis clubbing or edema  Neuro:  AAO, weaker on the right side, decreased hand grip.  Data Review   Micro Results No results found for this or any previous visit (from the past 240 hour(s)).  Radiology Reports Ct Head Wo Contrast  02/23/2015   CLINICAL DATA:  Bilateral lower extremity weakness  EXAM: CT HEAD WITHOUT CONTRAST  TECHNIQUE: Contiguous axial images were obtained from the base of the skull through the vertex without intravenous contrast.  COMPARISON:  09/01/2014, 08/31/2014.  FINDINGS: There is no intracranial hemorrhage, mass or evidence of acute infarction. There is no extra-axial fluid collection. There is moderately severe generalized atrophy and hemispheric hypodensity consistent with chronic small vessel ischemic disease. There is prior lacunar infarction in the left caudate head. No acute bony abnormalities are evident. Incidentally noted right frontal sinus osteoma.  IMPRESSION: Moderately severe generalized atrophy and chronic small vessel ischemic disease. No acute findings.   Electronically Signed   By: Ellery Plunk M.D.   On: 02/23/2015 01:35   Mr Brain Wo Contrast  02/23/2015   CLINICAL DATA:  Initial evaluation for acute aphasia, dysarthria. Gait instability.  EXAM: MRI HEAD WITHOUT CONTRAST  MRA HEAD WITHOUT CONTRAST   TECHNIQUE: Multiplanar, multiecho pulse sequences of the brain and surrounding structures were obtained without intravenous contrast. Angiographic images of the head were obtained using MRA technique without contrast.  COMPARISON:  Prior CT from earlier the same day as well as previous MRI from 09/01/2014.  FINDINGS: MRI HEAD FINDINGS  Diffuse prominence of the CSF containing spaces is compatible with generalized cerebral atrophy. Extensive patchy and confluent T2/FLAIR hyperintensity within the periventricular and deep white matter both cerebral hemispheres again seen, most compatible with advanced chronic microvascular ischemic disease. Similar changes seen within the pons.  There are patchy multi focal ischemic infarcts involving the central pons, with some extension inferiorly  towards the upper brainstem (series 3, image 15). Additional multi focal subcentimeter ischemic infarcts involving the right cerebellar hemisphere (series 3, image 13). A in 7 mm mildly hyperintense focus of restricted diffusion within the posterior right middle cerebellar peduncle appears more subacute in nature (series 3, image 11). Additional patchy subcentimeter ischemic infarcts within the medial left occipital lobe as well as the mid and left aspect of the splenium in the left PCA territory (series 3, image 24 image 18, 24). There is question of small subacute ischemic infarct within the left cerebellar hemisphere as well (series 3, image 9), as well as the left thalamus (series 3, image 25). There is question of tiny associated petechial hemorrhage within the central pons (series 9, image 8). No hemorrhagic transformation. No other chronic or acute intracranial hemorrhage. Absent flow void within the distal vertebral arteries and proximal -mid basilar artery.  No mass lesion or midline shift.  No extra-axial fluid collection.  Craniocervical junction within normal limits. Scattered multilevel degenerative changes present within the  visualized upper cervical spine. Pituitary gland normal.  No acute abnormality about the orbits.  Scattered mild mucoperiosteal thickening present within the ethmoidal air cells and maxillary sinuses. No mastoid effusion. Inner ear structures normal.  Bone marrow signal intensity normal. Scalp soft tissues unremarkable.  MRA HEAD FINDINGS  ANTERIOR CIRCULATION:  Visualized distal cervical segments of the internal carotid arteries are patent with antegrade flow. The petrous, cavernous, and supra clinoid segments are patent bilaterally. A1 segments are patent but demonstrate multi focal atheromatous irregularity. Multi focal air atheromatous irregularity present within the anterior cerebral arteries is well which are opacified to their distal aspects. There are superimposed moderate to severe multi focal stenoses within the left A2 segment.  M1 segments irregular but patent. Focal moderate stenosis within the right M1 segment noted. Irregular somewhat more prevalent within the right M1 segment. Distal MCA branches well opacified.  POSTERIOR CIRCULATION:  Flow was seen within the distal vertebral arteries within the upper neck. There is essentially absent flow within the distal V4 segments bilaterally as they course into the cranial vault. Posterior inferior cerebral arteries not well opacified. There is little to no flow within the basilar artery as well. Posterior cerebral arteries not definitely visualized. Superior cerebellar arteries not visualized either. This is new relative to most recent MRA from 09/01/2014.  No aneurysm.  IMPRESSION: MRI HEAD IMPRESSION:  1. Patchy multi focal ischemic infarcts within the pons extending inferiorly towards the brainstem, with additional subcentimeter infarcts involving the right cerebellar hemisphere, and left PCA territory. There are probable more subacute infarcts within the right cerebellar hemisphere as well as the left cerebellar hemisphere and left thalamus. No  significant mass effect. 2. Advanced cerebral atrophy with chronic microvascular ischemic disease.  MRA HEAD IMPRESSION:  1. Essentially absent flow within the distal vertebral arteries as they course into the cranial vault with absent flow within the basilar artery and its branches. This is new relative to prior study. 2. Patent anterior circulation. There are moderate to severe multi focal atheromatous stenoses within the left A2 segment. Moderate short-segment stenosis within the mid right M1 segment. Multi focal atheromatous irregularity within the distal MCA branches. Results were called by telephone at the time of interpretation on 02/23/2015 at 6:30 am to Dr. Lyda PeroneJARED GARDNER , who verbally acknowledged these results.   Electronically Signed   By: Rise MuBenjamin  McClintock M.D.   On: 02/23/2015 06:42   Mr Maxine GlennMra Head/brain Wo Cm  02/23/2015   CLINICAL  DATA:  Initial evaluation for acute aphasia, dysarthria. Gait instability.  EXAM: MRI HEAD WITHOUT CONTRAST  MRA HEAD WITHOUT CONTRAST  TECHNIQUE: Multiplanar, multiecho pulse sequences of the brain and surrounding structures were obtained without intravenous contrast. Angiographic images of the head were obtained using MRA technique without contrast.  COMPARISON:  Prior CT from earlier the same day as well as previous MRI from 09/01/2014.  FINDINGS: MRI HEAD FINDINGS  Diffuse prominence of the CSF containing spaces is compatible with generalized cerebral atrophy. Extensive patchy and confluent T2/FLAIR hyperintensity within the periventricular and deep white matter both cerebral hemispheres again seen, most compatible with advanced chronic microvascular ischemic disease. Similar changes seen within the pons.  There are patchy multi focal ischemic infarcts involving the central pons, with some extension inferiorly towards the upper brainstem (series 3, image 15). Additional multi focal subcentimeter ischemic infarcts involving the right cerebellar hemisphere (series 3,  image 13). A in 7 mm mildly hyperintense focus of restricted diffusion within the posterior right middle cerebellar peduncle appears more subacute in nature (series 3, image 11). Additional patchy subcentimeter ischemic infarcts within the medial left occipital lobe as well as the mid and left aspect of the splenium in the left PCA territory (series 3, image 24 image 18, 24). There is question of small subacute ischemic infarct within the left cerebellar hemisphere as well (series 3, image 9), as well as the left thalamus (series 3, image 25). There is question of tiny associated petechial hemorrhage within the central pons (series 9, image 8). No hemorrhagic transformation. No other chronic or acute intracranial hemorrhage. Absent flow void within the distal vertebral arteries and proximal -mid basilar artery.  No mass lesion or midline shift.  No extra-axial fluid collection.  Craniocervical junction within normal limits. Scattered multilevel degenerative changes present within the visualized upper cervical spine. Pituitary gland normal.  No acute abnormality about the orbits.  Scattered mild mucoperiosteal thickening present within the ethmoidal air cells and maxillary sinuses. No mastoid effusion. Inner ear structures normal.  Bone marrow signal intensity normal. Scalp soft tissues unremarkable.  MRA HEAD FINDINGS  ANTERIOR CIRCULATION:  Visualized distal cervical segments of the internal carotid arteries are patent with antegrade flow. The petrous, cavernous, and supra clinoid segments are patent bilaterally. A1 segments are patent but demonstrate multi focal atheromatous irregularity. Multi focal air atheromatous irregularity present within the anterior cerebral arteries is well which are opacified to their distal aspects. There are superimposed moderate to severe multi focal stenoses within the left A2 segment.  M1 segments irregular but patent. Focal moderate stenosis within the right M1 segment noted.  Irregular somewhat more prevalent within the right M1 segment. Distal MCA branches well opacified.  POSTERIOR CIRCULATION:  Flow was seen within the distal vertebral arteries within the upper neck. There is essentially absent flow within the distal V4 segments bilaterally as they course into the cranial vault. Posterior inferior cerebral arteries not well opacified. There is little to no flow within the basilar artery as well. Posterior cerebral arteries not definitely visualized. Superior cerebellar arteries not visualized either. This is new relative to most recent MRA from 09/01/2014.  No aneurysm.  IMPRESSION: MRI HEAD IMPRESSION:  1. Patchy multi focal ischemic infarcts within the pons extending inferiorly towards the brainstem, with additional subcentimeter infarcts involving the right cerebellar hemisphere, and left PCA territory. There are probable more subacute infarcts within the right cerebellar hemisphere as well as the left cerebellar hemisphere and left thalamus. No significant mass effect. 2. Advanced  cerebral atrophy with chronic microvascular ischemic disease.  MRA HEAD IMPRESSION:  1. Essentially absent flow within the distal vertebral arteries as they course into the cranial vault with absent flow within the basilar artery and its branches. This is new relative to prior study. 2. Patent anterior circulation. There are moderate to severe multi focal atheromatous stenoses within the left A2 segment. Moderate short-segment stenosis within the mid right M1 segment. Multi focal atheromatous irregularity within the distal MCA branches. Results were called by telephone at the time of interpretation on 02/23/2015 at 6:30 am to Dr. Lyda Perone , who verbally acknowledged these results.   Electronically Signed   By: Rise Mu M.D.   On: 02/23/2015 06:42    CBC  Recent Labs Lab 02/23/15 0121 02/23/15 0125 02/25/15 0406  WBC 10.0  --  7.0  HGB 14.1 15.0 13.5  HCT 42.3 44.0 41.5  PLT  179  --  152  MCV 93.4  --  92.8  MCH 31.1  --  30.2  MCHC 33.3  --  32.5  RDW 14.1  --  13.9  LYMPHSABS 2.3  --   --   MONOABS 0.7  --   --   EOSABS 0.2  --   --   BASOSABS 0.0  --   --     Chemistries   Recent Labs Lab 02/23/15 0121 02/23/15 0125 02/25/15 0406  NA 139 143 140  K 3.6 3.6 3.0*  CL 110 108 112*  CO2 21*  --  18*  GLUCOSE 127* 127* 80  BUN 19 21* 18  CREATININE 0.81 0.70 0.67  CALCIUM 8.9  --  8.5*  AST 20  --   --   ALT 10*  --   --   ALKPHOS 80  --   --   BILITOT 1.0  --   --    ------------------------------------------------------------------------------------------------------------------ estimated creatinine clearance is 35.6 mL/min (by C-G formula based on Cr of 0.67). ------------------------------------------------------------------------------------------------------------------  Recent Labs  02/23/15 0630  HGBA1C 5.5   ------------------------------------------------------------------------------------------------------------------  Recent Labs  02/23/15 0630  CHOL 240*  HDL 54  LDLCALC 159*  TRIG 134  CHOLHDL 4.4   ------------------------------------------------------------------------------------------------------------------ No results for input(s): TSH, T4TOTAL, T3FREE, THYROIDAB in the last 72 hours.  Invalid input(s): FREET3 ------------------------------------------------------------------------------------------------------------------ No results for input(s): VITAMINB12, FOLATE, FERRITIN, TIBC, IRON, RETICCTPCT in the last 72 hours.  Coagulation profile  Recent Labs Lab 02/23/15 0121  INR 1.03    No results for input(s): DDIMER in the last 72 hours.  Cardiac Enzymes No results for input(s): CKMB, TROPONINI, MYOGLOBIN in the last 168 hours.  Invalid input(s): CK ------------------------------------------------------------------------------------------------------------------ Invalid input(s):  POCBNP    Adom Schoeneck A D.O. on 02/25/2015 at 12:32 PM  Between 7am to 7pm - Pager - [phone]  After 7pm go to [email or web address] - password [email or web address]  And look for the night coverage person covering for me after hours  [facility] Office  [phone]

## 2015-02-25 NOTE — Progress Notes (Signed)
Initial Nutrition Assessment  DOCUMENTATION CODES:  Not applicable  INTERVENTION:  Tube feeding- Recommend the following TF regimen: Jevity 1.2 @ 20 ml/hr via NGT and increase by 10 ml every 4 hours to goal rate of 45 ml/hr.   30 ml Prostat once daily.    Tube feeding regimen provides 1396 kcal (100% of needs), 75 grams of protein, and 875 ml of H2O.    NUTRITION DIAGNOSIS:  Inadequate oral intake related to dysphagia as evidenced by NPO status.   GOAL:  Patient will meet greater than or equal to 90% of their needs   MONITOR:  Diet advancement, Labs, Weight trends  REASON FOR ASSESSMENT:  Low Braden    ASSESSMENT: 81 y.o. female h/o cerebrovascular disease with TIA and cerebellar infarction last November, patient presents to the ED with aphasia. Patient developed symptoms of abnormal gait, weakness, and aphasia 7 hours PTA.  Pt's daughter at bedside provided history. She reports that pt usually weighs 126 lbs and eats well with 3 meals daily. Pt did not have any issues chewing or swallowing prior to admission. Pt appears well nourished. She was re-assessed by SLP again this morning and remains NPO. Per MD note, awaiting family decision on PEG.   Labs: low potassium, low calcium  Height:  Ht Readings from Last 1 Encounters:  02/23/15 5' (1.524 m)    Weight:  Wt Readings from Last 1 Encounters:  02/23/15 126 lb 8.7 oz (57.4 kg)    Ideal Body Weight:  45.5 kg  Wt Readings from Last 10 Encounters:  02/23/15 126 lb 8.7 oz (57.4 kg)  02/05/15 134 lb (60.782 kg)  01/22/15 132 lb (59.875 kg)  10/30/14 131 lb (59.421 kg)  09/04/14 131 lb (59.421 kg)  08/31/14 131 lb 9.8 oz (59.7 kg)  01/09/14 130 lb (58.968 kg)  08/29/13 129 lb (58.514 kg)  06/06/13 125 lb (56.7 kg)  05/02/13 121 lb (54.885 kg)    BMI:  Body mass index is 24.71 kg/(m^2).  Estimated Nutritional Needs:  Kcal:  1350-1500  Protein:  70-80 grams  Fluid:  >/= 1.5 L/day  Skin:  Reviewed,  no issues  Diet Order:   NPO  EDUCATION NEEDS:  No education needs identified at this time   Intake/Output Summary (Last 24 hours) at 02/25/15 1406 Last data filed at 02/24/15 1900  Gross per 24 hour  Intake  487.5 ml  Output      0 ml  Net  487.5 ml    Last BM:  5/8   Ian Malkineanne Barnett RD, LDN Inpatient Clinical Dietitian Pager: [phone] After Hours Pager: [phone]

## 2015-02-26 ENCOUNTER — Ambulatory Visit (HOSPITAL_COMMUNITY): Payer: Medicare Other

## 2015-02-26 DIAGNOSIS — I6789 Other cerebrovascular disease: Secondary | ICD-10-CM

## 2015-02-26 LAB — BASIC METABOLIC PANEL
Anion gap: 12 (ref 5–15)
BUN: 18 mg/dL (ref 6–20)
CO2: 16 mmol/L — ABNORMAL LOW (ref 22–32)
Calcium: 8.7 mg/dL — ABNORMAL LOW (ref 8.9–10.3)
Chloride: 112 mmol/L — ABNORMAL HIGH (ref 101–111)
Creatinine, Ser: 0.78 mg/dL (ref 0.44–1.00)
GFR calc non Af Amer: >60 mL/min (ref >60)
Glucose, Bld: 76 mg/dL (ref 65–99)
POTASSIUM: 3.7 mmol/L (ref 3.5–5.1)
SODIUM: 140 mmol/L (ref 135–145)

## 2015-02-26 MED ORDER — CHLORHEXIDINE GLUCONATE 0.12 % MT SOLN
15.0000 mL | Freq: Two times a day (BID) | OROMUCOSAL | Status: DC
  Administered 2015-02-26 – 2015-02-27 (×3): 15 mL via OROMUCOSAL
  Filled 2015-02-26 (×3): qty 15

## 2015-02-26 MED ORDER — CETYLPYRIDINIUM CHLORIDE 0.05 % MT LIQD
7.0000 mL | Freq: Two times a day (BID) | OROMUCOSAL | Status: DC
  Administered 2015-02-26 (×2): 7 mL via OROMUCOSAL

## 2015-02-26 NOTE — Clinical Social Work Note (Signed)
FL-2 and DNR form placed on chart for MD signature.   Derenda FennelBashira Mukhtar Shams, MSW, LCSWA [phone] 02/26/2015 4:10 PM

## 2015-02-26 NOTE — Progress Notes (Signed)
Physical Therapy Treatment Patient Details Name: Alice Swanson MRN: [id number] DOB: 1920-06-29 Today's Date: 02/26/2015    History of Present Illness 81 y.o. female with a PMHx of hyperlipidemia, HTN, TIA, cerebrovascular disease, and osteoarthritis who presents after being found to have trouble difficulty speaking and walking. MRI revealed patchy multi focal ischemic infarcts within the pons extending inferiorly towards the brainstem, with additional subcentimeter infarcts involving the right cerebellar hemisphere, and left PCA territory.    PT Comments    Pt attempting to help more with bed mobility when given increased time.  Requires +2 for transfer. Pt able to answer a few questions verbally today with "yes" and sometimes with nodding/shaking head.  Asking in garbled sentence, "Getting up to chair?" and pointing to it while seated EOB.  Other times, she does not seem to answer.  Cont to recommend SNF.  Follow Up Recommendations  SNF;Supervision/Assistance - 24 hour     Equipment Recommendations  None recommended by PT    Recommendations for Other Services       Precautions / Restrictions Precautions Precautions: Fall Restrictions Weight Bearing Restrictions: No    Mobility  Bed Mobility Overal bed mobility: Needs Assistance Bed Mobility: Supine to Sit     Supine to sit: Mod assist;+2 for physical assistance     General bed mobility comments: Pt did move legs some to A with coming to EOB, but needed A with trunk and bed pads to get hips turned.  Transfers Overall transfer level: Needs assistance Equipment used: None Transfers: Sit to/from [employer] CorporationStand;Stand Pivot Transfers Sit to Stand: Max assist Stand pivot transfers: Max assist;+2 physical assistance       General transfer comment: Pt able to stand with MAX A of 1 with face to face transfer, but with SPT, pt not moving legs and second person A getting hips turned in front of recliner.  No buckeling.  Ambulation/Gait                  Stairs            Wheelchair Mobility    Modified Rankin (Stroke Patients Only) Modified Rankin (Stroke Patients Only) Pre-Morbid Rankin Score: Moderate disability Modified Rankin: Severe disability     Balance Overall balance assessment: Needs assistance Sitting-balance support: Feet supported;Feet unsupported;Bilateral upper extremity supported Sitting balance-Leahy Scale: Poor Sitting balance - Comments: Poor balance with feet unsupported going L and posteriorlly.  When re-positioned with feet on the floor, balance fluctuated with periods of holding self upright and then slowly going posteriorly. Postural control: Left lateral lean;Posterior lean Standing balance support: During functional activity Standing balance-Leahy Scale: Zero Standing balance comment: Pt unable to maintain standing without MAX A and little effort given.                    Cognition Arousal/Alertness: Awake/alert Behavior During Therapy: WFL for tasks assessed/performed Overall Cognitive Status: Impaired/Different from baseline Area of Impairment: Following commands       Following Commands: Follows one step commands inconsistently;Follows one step commands with increased time       General Comments: Pt able to answer a few questions with 1 wird verbal answer and other times unable to answer even with increased time.  She was able to nod or shake head to answer other questions.    Exercises      General Comments General comments (skin integrity, edema, etc.): Pt attempting to help.  Requires extra time, but able to respond to some commands with  the increased time.      Pertinent Vitals/Pain Pain Assessment: No/denies pain Faces Pain Scale: No hurt    Home Living                      Prior Function            PT Goals (current goals can now be found in the care plan section) Acute Rehab PT Goals Patient Stated Goal: none stated PT Goal  Formulation: Patient unable to participate in goal setting Time For Goal Achievement: 03/10/15 Potential to Achieve Goals: Fair Progress towards PT goals: Progressing toward goals    Frequency  Min 3X/week    PT Plan Current plan remains appropriate    Co-evaluation             End of Session Equipment Utilized During Treatment: Gait belt Activity Tolerance: Patient tolerated treatment well Patient left: in chair;with call bell/phone within reach;with chair alarm set;with family/visitor present     Time: [id number] PT Time Calculation (min) (ACUTE ONLY): 21 min  Charges:  $Therapeutic Activity: 8-22 mins                    G Codes:      Alice Swanson 02/26/2015, 11:03 AM

## 2015-02-26 NOTE — Clinical Social Work Note (Signed)
Clinical Social Work Assessment  Patient Details  Name: Alice Swanson MRN: [id number] Date of Birth: 1919/11/10  Date of referral:  02/26/15               Reason for consult:  End of Life/Hospice, Discharge Planning                Permission sought to share information with:  Case Manager, Facility Medical sales representativeContact Representative, Family Supports Permission granted to share information::  Yes, Verbal Permission Granted  Name::      [name](Julie Brown )  Agency::   (N/A)  Relationship::   (Daughter )  Contact Information:   [phone]  Housing/Transportation Living arrangements for the past 2 months:  Assisted Living Facility Source of Information:  Adult Children Patient Interpreter Needed:  None Criminal Activity/Legal Involvement Pertinent to Current Situation/Hospitalization:  No - Comment as needed Significant Relationships:  Adult Children Lives with:  Facility Resident Do you feel safe going back to the place where you live?  Yes Need for family participation in patient care:  Yes (Comment)  Care giving concerns:  No care giving concerns reported at this time.    Social Worker assessment / plan:  Visual merchandiserClinical Social Worker spoke with pt's dtr, Willette PaJulie Brown via telephone in reference to post-acute placement at SNF. CSW introduced CSW role and SNF process. Pt's dtr stated [facility] for the past several years and would like for pt to return if appropriate. CSW explained the difference of SNF vs ALF and that pt requires a higher level of care. PT is currently recommending SNF placement. CSW has faxed updated FL-[facility] and [facility] and facility has accepted pt as a skilled nursing resident.   CSW received consult in reference to pt now requiring hospice care and [facility]. [facility] reported facility is able to accommodate pt's needs with hospice care and  plans to meet with family to follow up with goals of care. Facility admissions coordinator, Darden PalmerKathy Osman also reported they would contact hospice facility. CSW has faxed palliative care evaluation including goals of care information. Pt's dtr reported she is agreeable to pt [facility]. CSW has notified MD. CSW currently has NOT received consult for residential hospice placement. No further concerns reported by pt's dtr, Raynelle FanningJulie. CSW will continue to follow pt and pt's family for continued support and to facilitate pt's discharge needs.   Employment status:  Retired Database administratornsurance information:  Managed Medicare PT Recommendations:  Skilled Nursing Facility Information / Referral to community resources:  Skilled Nursing Facility  Patient/Family's Response to care:  Pt lying in bed disoriented. Pt's dtr, reported she is overwhelmed however has agreed to [facility]'s [facility] care. Pt's dtr pleasant and appreciated social work intervention.   Patient/Family's Understanding of and Emotional Response to Diagnosis, Current Treatment, and Prognosis:  Pt's dtr expressed understanding of pt's prognosis, goals of care and life expectancy of 14 days or less. Pt's dtr accepting and stated she is overwhelmed and would discuss discharge plans further with her sister today. CSW remains available as needed.  Emotional Assessment Appearance:  Appears stated age Attitude/Demeanor/Rapport:  Unable to Assess Affect (typically observed):  Unable to Assess Orientation:  Oriented to Self Alcohol / Substance use:  Never Used Psych involvement (Current and /or in the community):  No (Comment)  Discharge Needs  Concerns to be addressed:  No discharge needs identified Readmission within the last 30 days:  No Current discharge risk:  None Barriers to Discharge:  No Barriers Identified   Derenda FennelBashira Sahir Tolson, MSW, LCSWA [phone] 02/26/2015 4:10 PM

## 2015-02-26 NOTE — Progress Notes (Signed)
Daily Progress Note   Patient Name: Alice Swanson       Date: 02/26/2015 DOB: 11/05/1919  Age: 81 y.o. MRN#: [id number] Attending Physician: Alba CoryBelkys A Regalado, MD Primary Care Physician: Kimber RelicGREEN, ARTHUR G, MD Admit Date: 02/23/2015  Reason for Consultation/Follow-up: Establishing goals of care and Other  Subjective: Appears comfortable. Seems to comprehend what is said: was able to smile appropriately and laugh at my jokes Interval Events: Repeat swallow eval: remains unsafe to eat/swallow. No other events Length of Stay: 3 days  Current Medications: Scheduled Meds:   antiseptic oral rinse  7 mL Mouth Rinse q12n4p   aspirin  300 mg Rectal Daily   Or   aspirin  325 mg Oral Daily   chlorhexidine  15 mL Mouth Rinse BID   clopidogrel  75 mg Oral Daily   dorzolamide  1 drop Both Eyes QHS   erythromycin  1 application Both Eyes QHS   heparin  5,000 Units Subcutaneous 3 times per day   timolol  1 drop Both Eyes QHS    Continuous Infusions:  sodium chloride 75 mL/hr at 02/25/15 2203    PRN Meds: acetaminophen, hydrALAZINE  Palliative Performance Scale: 10%     Vital Signs: BP 200/61 mmHg   Pulse 58   Temp(Src) 97.8 F (36.6 C) (Oral)   Resp 18   Ht 5' (1.524 m)   Wt 57.4 kg (126 lb 8.7 oz)   BMI 24.71 kg/m2   SpO2 97% SpO2: SpO2: 97 % O2 Device: O2 Device: Not Delivered O2 Flow Rate:    Intake/output summary: No intake or output data in the 24 hours ending 02/26/15 1055 LBM:   Baseline Weight: Weight: 57.4 kg (126 lb 8.7 oz) Most recent weight: Weight: 57.4 kg (126 lb 8.7 oz)  Physical Exam: Gen'l- well preserved elderly woman in no distress HEENT - mild erythema eyelids, C&S clear, Pupils equal, EOMI Cor- 2+ radial pulse, RRR Pulm - normal respirations Neuro - awake, seems alert but with expressive aphasia, moves u[pper extremities. She has a slight facial droop.              Additional Data Reviewed: Recent Labs     02/25/15  0406  02/26/15  0818  WBC   7.0   --   HGB  13.5   --   PLT  152   --   NA  140  140  BUN  18  18  CREATININE  0.67  0.78   BMP Latest Ref Rng 02/26/2015 02/25/2015 02/23/2015  Glucose 65 - 99 mg/dL 76 80 782(N127(H)  BUN 6 - 20 mg/dL 18 18 56(O21(H)  Creatinine 0.44 - 1.00 mg/dL 1.300.78 8.650.67 7.840.70  Sodium 135 - 145 mmol/L 140 140 143  Potassium 3.5 - 5.1 mmol/L 3.7 3.0(L) 3.6  Chloride 101 - 111 mmol/L 112(H) 112(H) 108  CO2 22 - 32 mmol/L 16(L) 18(L) -  Calcium 8.9 - 10.3 mg/dL 6.9(G8.7(L) 2.9(B8.5(L) -   Total protein 6.8 02/23/15     Problem List:  Patient Active Problem List   Diagnosis Date Noted   Dysphagia, pharyngoesophageal phase    Palliative care encounter    Aphasia 02/23/2015   Acute CVA (cerebrovascular accident)    Essential hypertension    Hyperlipidemia    Cerebral infarction due to thrombosis of basilar artery    Dysarthria    Blepharitis of both eyes 10/30/2014   Sinusitis, chronic 10/30/2014   Cerebral atrophy    Hypokalemia 09/03/2014   Bradycardia 09/01/2014  TIA (transient ischemic attack) 08/31/2014   Cerebellar infarction 08/31/2014   Cerebrovascular disease 08/31/2014   Memory deficits 01/12/2014   Constipation 01/12/2014   Abnormality of gait 08/29/2013   Sciatic pain 05/05/2013   Hemorrhoids, external 05/05/2013   Hypertension 08/10/2012   Depression 08/10/2012   Anxiety 08/10/2012   Hypercholesteremia 08/10/2012   Allergic rhinitis 08/10/2012     Palliative Care Assessment & Plan    Code Status:  DNR/DNI, no artificial nutrition or hydration  Goals of Care:  Comfort care.  Desire for further Chaplaincy support:no  3. Symptom Management:  Comfortable with no issues to address  4. Palliative Prophylaxis:  Stool Softner: not indicated  5. Prognosis: 2-4 weeks  5. Discharge Planning: Home with Home Health  [facility]   Care plan was discussed with Daughter: prolonged discussion about options regarding  artificial hydration and nutrition and the anticipated consequences of these decisions. At the conclusion of the meeting the decision is for comfort care and Hospice. Referral placed for case manager consult to see if [facility] is feasible. If not - residential hospice. All meds reviewed for need. Comfort care orders are in place.   Thank you for allowing the Palliative Medicine Team to assist in the care of this patient.   Time In: 1055 Time Out: 1152 Total Time 57 min Prolonged Time Billed  no     Greater than 50%  of this time was spent counseling and coordinating care related to the above assessment and plan.   [No Provider Link Found]  02/26/2015, 10:55 AM  Please contact Palliative Medicine Team phone at [phone] for questions and concerns.

## 2015-02-26 NOTE — Progress Notes (Signed)
[facility]                                                                              Patient Demographics  Alice Swanson, is a 81 y.o. female, DOB - [birth year], ZOX:[id number]  Admit date - 02/23/2015   Admitting Physician Hillary Bow, DO  Outpatient Primary MD for the patient is GREEN, Lenon Curt, MD  LOS - 3   Chief Complaint  Patient presents with  . Stroke Symptoms     HPI on 02/23/2015 by Dr. Lyda Perone Alice Swanson is a 81 y.o. female h/o cerebrovascular disease with TIA and cerebellar infarction last November, patient presents to the ED with aphasia. Patient developed symptoms of abnormal gait, weakness, and aphasia 7 hours PTA at about 6:00 PM yesterday evening. She is typically able to ambulate on her own and speak fluently. Facility reports abnormal speech as well as urinary incontinence at shift change at midnight.  Interim history Patient did have an acute CVA on MRI. Echocardiogram still pending. PT and OT recommended. Patient failed her swallow eval and MBS. She will likely need a PEG tube. Suspected daughter regarding this. Neurology recommended aspirin and Plavix for 3 months followed by just Lasix alone once a patient is able to take pills. Also recommending keeping her blood pressure a little higher due to posterior circulation issues.  Assessment & Plan   Acute CVA, aphasia -Patient did have recent CVA November 2015 -CT head: No acute findings -MRI brain: Patchy multifocal ischemic infarcts in the pons extending inferiorly towards the brainstem, subcentimeter infarcts involving right cerebellar hemisphere, left PCA territory -Echocardiogram: Pending -Carotid doppler: 1-39% ICA stenosis, vertebral artery flow is antegrade -LDL 159, family whishes comfort care. Patient not toleration orals.  -hemoglobin A1c 5.5 -PT/OT recommended SNF -Neurology consulted and appreciated, recommended aspirin and Plavix for 3 months then Plavix alone. Also recommended  keeping patient's blood pressure a little higher due to posterior circulation issues. -Continue aspirin rectally -family has decide to proceed with comfort care. No Peg tube.  Continue with IV fluids until transfer to facility.   Hypertension -Allow for permissive hypertension  Hyperlipidemia -Lipid panel: TC 240, TG 134, HDL 54, LDL 159 -Lipitor ordered, however, patient is still NPO after MBS -need statins.   History of coronary artery disease -currently chest pain-free -troponin negative  Metabolic acidosis. Continue with UV fluids. Daughter agree with IV fluids while in the hospital. Will discontinue fluids at discharge/   Code Status: DNR (spoke with patient's daughter [name] who comfirmed)  Family Communication:  daughter at bedside.   Disposition Plan:transfer to SNF 5-13. Continue with IV fluids for 24 hours.   Time Spent in minutes   30 minutes  Procedures  Carotid doppler  Consults   Neurology  DVT Prophylaxis   heparin  Lab Results  Component Value Date   PLT 152 02/25/2015    Medications  Scheduled Meds: . antiseptic oral rinse  7 mL Mouth Rinse q12n4p  . aspirin  300 mg Rectal Daily   Or  . aspirin  325 mg Oral Daily  . chlorhexidine  15 mL Mouth Rinse BID  . dorzolamide  1 drop Both Eyes QHS  .  erythromycin  1 application Both Eyes QHS  . heparin  5,000 Units Subcutaneous 3 times per day  . timolol  1 drop Both Eyes QHS   Continuous Infusions: . sodium chloride 100 mL/hr at 02/26/15 1347   PRN Meds:.  Antibiotics     Anti-infectives    None        Subjective:   Alice Swanson seen and examined today.  Patient is alert, aphasic, smile.   Objective:   Filed Vitals:   02/26/15 0156 02/26/15 0617 02/26/15 0944 02/26/15 1411  BP: 171/61 193/74 200/61 160/69  Pulse: 61 66 58 62  Temp: 97.9 F (36.6 C) 97.5 F (36.4 C) 97.8 F (36.6 C) 97.8 F (36.6 C)  TempSrc: Oral Oral Oral Oral  Resp: 18 16 18 20   Height:      Weight:       SpO2: 99% 99% 97% 98%    Wt Readings from Last 3 Encounters:  02/23/15 57.4 kg (126 lb 8.7 oz)  02/05/15 60.782 kg (134 lb)  01/22/15 59.875 kg (132 lb)     Intake/Output Summary (Last 24 hours) at 02/26/15 1510 Last data filed at 02/26/15 1347  Gross per 24 hour  Intake 3208.75 ml  Output      0 ml  Net 3208.75 ml    Exam  General:  no distress, alert, dysarthric, aphasic.   HEENT: NCAT, PERRLA, EOMI, Anicteic Sclera, mucous membranes moist.   Cardiovascular: S1 S2 auscultated, Irregular   Respiratory: Clear to auscultation bilaterally with equal chest rise  Abdomen: Soft, nontender, nondistended, + bowel sounds  Extremities: warm dry without cyanosis clubbing or edema  Neuro:  AAO, weaker on the right side, decreased hand grip.  Data Review   Micro Results No results found for this or any previous visit (from the past 240 hour(s)).  Radiology Reports Ct Head Wo Contrast  02/23/2015   CLINICAL DATA:  Bilateral lower extremity weakness  EXAM: CT HEAD WITHOUT CONTRAST  TECHNIQUE: Contiguous axial images were obtained from the base of the skull through the vertex without intravenous contrast.  COMPARISON:  09/01/2014, 08/31/2014.  FINDINGS: There is no intracranial hemorrhage, mass or evidence of acute infarction. There is no extra-axial fluid collection. There is moderately severe generalized atrophy and hemispheric hypodensity consistent with chronic small vessel ischemic disease. There is prior lacunar infarction in the left caudate head. No acute bony abnormalities are evident. Incidentally noted right frontal sinus osteoma.  IMPRESSION: Moderately severe generalized atrophy and chronic small vessel ischemic disease. No acute findings.   Electronically Signed   By: Ellery Plunkaniel R Mitchell M.D.   On: 02/23/2015 01:35   Mr Brain Wo Contrast  02/23/2015   CLINICAL DATA:  Initial evaluation for acute aphasia, dysarthria. Gait instability.  EXAM: MRI HEAD WITHOUT CONTRAST  MRA  HEAD WITHOUT CONTRAST  TECHNIQUE: Multiplanar, multiecho pulse sequences of the brain and surrounding structures were obtained without intravenous contrast. Angiographic images of the head were obtained using MRA technique without contrast.  COMPARISON:  Prior CT from earlier the same day as well as previous MRI from 09/01/2014.  FINDINGS: MRI HEAD FINDINGS  Diffuse prominence of the CSF containing spaces is compatible with generalized cerebral atrophy. Extensive patchy and confluent T2/FLAIR hyperintensity within the periventricular and deep white matter both cerebral hemispheres again seen, most compatible with advanced chronic microvascular ischemic disease. Similar changes seen within the pons.  There are patchy multi focal ischemic infarcts involving the central pons, with some extension inferiorly towards the upper brainstem (  series 3, image 15). Additional multi focal subcentimeter ischemic infarcts involving the right cerebellar hemisphere (series 3, image 13). A in 7 mm mildly hyperintense focus of restricted diffusion within the posterior right middle cerebellar peduncle appears more subacute in nature (series 3, image 11). Additional patchy subcentimeter ischemic infarcts within the medial left occipital lobe as well as the mid and left aspect of the splenium in the left PCA territory (series 3, image 24 image 18, 24). There is question of small subacute ischemic infarct within the left cerebellar hemisphere as well (series 3, image 9), as well as the left thalamus (series 3, image 25). There is question of tiny associated petechial hemorrhage within the central pons (series 9, image 8). No hemorrhagic transformation. No other chronic or acute intracranial hemorrhage. Absent flow void within the distal vertebral arteries and proximal -mid basilar artery.  No mass lesion or midline shift.  No extra-axial fluid collection.  Craniocervical junction within normal limits. Scattered multilevel degenerative  changes present within the visualized upper cervical spine. Pituitary gland normal.  No acute abnormality about the orbits.  Scattered mild mucoperiosteal thickening present within the ethmoidal air cells and maxillary sinuses. No mastoid effusion. Inner ear structures normal.  Bone marrow signal intensity normal. Scalp soft tissues unremarkable.  MRA HEAD FINDINGS  ANTERIOR CIRCULATION:  Visualized distal cervical segments of the internal carotid arteries are patent with antegrade flow. The petrous, cavernous, and supra clinoid segments are patent bilaterally. A1 segments are patent but demonstrate multi focal atheromatous irregularity. Multi focal air atheromatous irregularity present within the anterior cerebral arteries is well which are opacified to their distal aspects. There are superimposed moderate to severe multi focal stenoses within the left A2 segment.  M1 segments irregular but patent. Focal moderate stenosis within the right M1 segment noted. Irregular somewhat more prevalent within the right M1 segment. Distal MCA branches well opacified.  POSTERIOR CIRCULATION:  Flow was seen within the distal vertebral arteries within the upper neck. There is essentially absent flow within the distal V4 segments bilaterally as they course into the cranial vault. Posterior inferior cerebral arteries not well opacified. There is little to no flow within the basilar artery as well. Posterior cerebral arteries not definitely visualized. Superior cerebellar arteries not visualized either. This is new relative to most recent MRA from 09/01/2014.  No aneurysm.  IMPRESSION: MRI HEAD IMPRESSION:  1. Patchy multi focal ischemic infarcts within the pons extending inferiorly towards the brainstem, with additional subcentimeter infarcts involving the right cerebellar hemisphere, and left PCA territory. There are probable more subacute infarcts within the right cerebellar hemisphere as well as the left cerebellar hemisphere and  left thalamus. No significant mass effect. 2. Advanced cerebral atrophy with chronic microvascular ischemic disease.  MRA HEAD IMPRESSION:  1. Essentially absent flow within the distal vertebral arteries as they course into the cranial vault with absent flow within the basilar artery and its branches. This is new relative to prior study. 2. Patent anterior circulation. There are moderate to severe multi focal atheromatous stenoses within the left A2 segment. Moderate short-segment stenosis within the mid right M1 segment. Multi focal atheromatous irregularity within the distal MCA branches. Results were called by telephone at the time of interpretation on 02/23/2015 at 6:30 am to Dr. Lyda Perone , who verbally acknowledged these results.   Electronically Signed   By: Rise Mu M.D.   On: 02/23/2015 06:42   Mr Maxine Glenn Head/brain Wo Cm  02/23/2015   CLINICAL DATA:  Initial evaluation  for acute aphasia, dysarthria. Gait instability.  EXAM: MRI HEAD WITHOUT CONTRAST  MRA HEAD WITHOUT CONTRAST  TECHNIQUE: Multiplanar, multiecho pulse sequences of the brain and surrounding structures were obtained without intravenous contrast. Angiographic images of the head were obtained using MRA technique without contrast.  COMPARISON:  Prior CT from earlier the same day as well as previous MRI from 09/01/2014.  FINDINGS: MRI HEAD FINDINGS  Diffuse prominence of the CSF containing spaces is compatible with generalized cerebral atrophy. Extensive patchy and confluent T2/FLAIR hyperintensity within the periventricular and deep white matter both cerebral hemispheres again seen, most compatible with advanced chronic microvascular ischemic disease. Similar changes seen within the pons.  There are patchy multi focal ischemic infarcts involving the central pons, with some extension inferiorly towards the upper brainstem (series 3, image 15). Additional multi focal subcentimeter ischemic infarcts involving the right cerebellar  hemisphere (series 3, image 13). A in 7 mm mildly hyperintense focus of restricted diffusion within the posterior right middle cerebellar peduncle appears more subacute in nature (series 3, image 11). Additional patchy subcentimeter ischemic infarcts within the medial left occipital lobe as well as the mid and left aspect of the splenium in the left PCA territory (series 3, image 24 image 18, 24). There is question of small subacute ischemic infarct within the left cerebellar hemisphere as well (series 3, image 9), as well as the left thalamus (series 3, image 25). There is question of tiny associated petechial hemorrhage within the central pons (series 9, image 8). No hemorrhagic transformation. No other chronic or acute intracranial hemorrhage. Absent flow void within the distal vertebral arteries and proximal -mid basilar artery.  No mass lesion or midline shift.  No extra-axial fluid collection.  Craniocervical junction within normal limits. Scattered multilevel degenerative changes present within the visualized upper cervical spine. Pituitary gland normal.  No acute abnormality about the orbits.  Scattered mild mucoperiosteal thickening present within the ethmoidal air cells and maxillary sinuses. No mastoid effusion. Inner ear structures normal.  Bone marrow signal intensity normal. Scalp soft tissues unremarkable.  MRA HEAD FINDINGS  ANTERIOR CIRCULATION:  Visualized distal cervical segments of the internal carotid arteries are patent with antegrade flow. The petrous, cavernous, and supra clinoid segments are patent bilaterally. A1 segments are patent but demonstrate multi focal atheromatous irregularity. Multi focal air atheromatous irregularity present within the anterior cerebral arteries is well which are opacified to their distal aspects. There are superimposed moderate to severe multi focal stenoses within the left A2 segment.  M1 segments irregular but patent. Focal moderate stenosis within the right  M1 segment noted. Irregular somewhat more prevalent within the right M1 segment. Distal MCA branches well opacified.  POSTERIOR CIRCULATION:  Flow was seen within the distal vertebral arteries within the upper neck. There is essentially absent flow within the distal V4 segments bilaterally as they course into the cranial vault. Posterior inferior cerebral arteries not well opacified. There is little to no flow within the basilar artery as well. Posterior cerebral arteries not definitely visualized. Superior cerebellar arteries not visualized either. This is new relative to most recent MRA from 09/01/2014.  No aneurysm.  IMPRESSION: MRI HEAD IMPRESSION:  1. Patchy multi focal ischemic infarcts within the pons extending inferiorly towards the brainstem, with additional subcentimeter infarcts involving the right cerebellar hemisphere, and left PCA territory. There are probable more subacute infarcts within the right cerebellar hemisphere as well as the left cerebellar hemisphere and left thalamus. No significant mass effect. 2. Advanced cerebral atrophy with chronic  microvascular ischemic disease.  MRA HEAD IMPRESSION:  1. Essentially absent flow within the distal vertebral arteries as they course into the cranial vault with absent flow within the basilar artery and its branches. This is new relative to prior study. 2. Patent anterior circulation. There are moderate to severe multi focal atheromatous stenoses within the left A2 segment. Moderate short-segment stenosis within the mid right M1 segment. Multi focal atheromatous irregularity within the distal MCA branches. Results were called by telephone at the time of interpretation on 02/23/2015 at 6:30 am to Dr. Lyda Perone , who verbally acknowledged these results.   Electronically Signed   By: Rise Mu M.D.   On: 02/23/2015 06:42    CBC  Recent Labs Lab 02/23/15 0121 02/23/15 0125 02/25/15 0406  WBC 10.0  --  7.0  HGB 14.1 15.0 13.5  HCT 42.3  44.0 41.5  PLT 179  --  152  MCV 93.4  --  92.8  MCH 31.1  --  30.2  MCHC 33.3  --  32.5  RDW 14.1  --  13.9  LYMPHSABS 2.3  --   --   MONOABS 0.7  --   --   EOSABS 0.2  --   --   BASOSABS 0.0  --   --     Chemistries   Recent Labs Lab 02/23/15 0121 02/23/15 0125 02/25/15 0406 02/26/15 0818  NA 139 143 140 140  K 3.6 3.6 3.0* 3.7  CL 110 108 112* 112*  CO2 21*  --  18* 16*  GLUCOSE 127* 127* 80 76  BUN 19 21* 18 18  CREATININE 0.81 0.70 0.67 0.78  CALCIUM 8.9  --  8.5* 8.7*  AST 20  --   --   --   ALT 10*  --   --   --   ALKPHOS 80  --   --   --   BILITOT 1.0  --   --   --    ------------------------------------------------------------------------------------------------------------------ estimated creatinine clearance is 35.6 mL/min (by C-G formula based on Cr of 0.78). ------------------------------------------------------------------------------------------------------------------ No results for input(s): HGBA1C in the last 72 hours. ------------------------------------------------------------------------------------------------------------------ No results for input(s): CHOL, HDL, LDLCALC, TRIG, CHOLHDL, LDLDIRECT in the last 72 hours. ------------------------------------------------------------------------------------------------------------------ No results for input(s): TSH, T4TOTAL, T3FREE, THYROIDAB in the last 72 hours.  Invalid input(s): FREET3 ------------------------------------------------------------------------------------------------------------------ No results for input(s): VITAMINB12, FOLATE, FERRITIN, TIBC, IRON, RETICCTPCT in the last 72 hours.  Coagulation profile  Recent Labs Lab 02/23/15 0121  INR 1.03    No results for input(s): DDIMER in the last 72 hours.  Cardiac Enzymes No results for input(s): CKMB, TROPONINI, MYOGLOBIN in the last 168 hours.  Invalid input(s):  CK ------------------------------------------------------------------------------------------------------------------ Invalid input(s): POCBNP    Melessia Kaus A MD. on 02/26/2015 at 3:10 PM  Between 7am to 7pm - Pager - [phone]  After 7pm go to [email or web address] - password [email or web address]  And look for the night coverage person covering for me after hours  [facility] Office  [phone]

## 2015-02-26 NOTE — Progress Notes (Signed)
  Echocardiogram 2D Echocardiogram has been performed.  Janalyn HarderWest, Jaxton Casale R 02/26/2015, 12:25 PM

## 2015-02-26 NOTE — Progress Notes (Signed)
Speech Language Pathology Treatment: Dysphagia;Cognitive-Linquistic  Patient Details Name: Alice Swanson Sarafian MRN: [id number] DOB: 06/10/1920 Today's Date: 02/26/2015 Time: 0823-0900 SLP Time Calculation (min) (ACUTE ONLY): 37 min  Assessment / Plan / Recommendation Clinical Impression  PT demonstrating improved expressive communication = stating "bathroom" during session and with moderate verbal/visual cueing verbalizing "urinate" over call system.  With moderate cues, pt able to improve speech clarity for word level to intelligible output.  Yes/no responses appropriate today.    Pt was not oriented to situation with y/n response.  Pt is easily distracted by other stimuli.  Toward end of session after using bedside commode, she demonstrated increased difficulties with articulation of her name.  Verbal/articulatory cues helpful for pt to repeat her name with 80% accuracy.  Use of call bell reviewed with pt early in session but she did not recall proper usage by the end despite cues.  ? fatigue factor.   Oral care provided after which SLP presented 1/2 tsp applesauce and 1/2 tsp water.  Pt with delay in oral transiting due to weakness/motor planning up to 1 minute despite max verbal/visual/tactile cues.  Delayed swallow of puree followed by delayed weak cough.  Pt with faster initiation of 1/2 tsp water - suspect due to premature spillage into pharynx.    Note decisions pending re: PEG vs comfort feeding care plan.  SLP recommends to continue npo with adequate oral care, agree that dysphagia will likely not improve to functional level in this acute stay.    Will continue to follow for maximal communication and swallow abilities.      HPI Other Pertinent Information: Alice Swanson Fluegel is an 81 y.o. female with a past medical history significant for HTN, hyperlipidemia, MI, TIA, ischemic cerebellar infarct 11/15, OA, brought in for further evaluation of language impairment. [facility] and at  baseline ambulates with a walker. Her last known well is not really known, but apparently patient developed bilateral leg weakness and language impairment that motivated call to EMS due to concern for stroke. Patient said that she is aware of having trouble talking and "my right leg getting weaker" since 5/8. Daughter and patient both report recent decline in her health, definitely worse past 6 months. MRI shows patchy pontine and brainstem infartcs, bilateral cerebellar infarcts.    Pertinent Vitals Pain Assessment: No/denies pain  SLP Plan  Continue with current plan of care    Recommendations Diet recommendations: NPO Medication Administration: Via alternative means              Oral Care Recommendations: Oral care QID Follow up Recommendations: 24 hour supervision/assistance;Skilled Nursing facility Plan: Continue with current plan of care    GO     Donavan Burnetamara Rainn Bullinger, MS [facility] SLP [phone]

## 2015-02-26 NOTE — Clinical Social Work Placement (Signed)
   CLINICAL SOCIAL WORK PLACEMENT  NOTE  Date:  02/26/2015  Patient Details  Name: Alice Swanson MRN: [id number] Date of Birth: 06-13-1920  Clinical Social Work is seeking post-discharge placement for this patient at the Skilled  Nursing Facility level of care (*CSW will initial, date and re-position this form in  chart as items are completed):  Yes   Patient/family provided with [facility] Clinical Social Work Department's list of facilities offering this level of care within the geographic area requested by the patient (or if unable, by the patient's family).  Yes   Patient/family informed of their freedom to choose among providers that offer the needed level of care, that participate in Medicare, Medicaid or managed care program needed by the patient, have an available bed and are willing to accept the patient.  Yes   Patient/family informed of [facility]'s ownership interest in [facility] and [facility], as well as of the fact that they are under no obligation to receive care at these facilities.  PASRR submitted to EDS on 02/26/15     PASRR number received on       Existing PASRR number confirmed on 02/26/15     FL2 transmitted to all facilities in geographic area requested by pt/family on 02/26/15     FL2 transmitted to all facilities within larger geographic area on       Patient informed that his/her managed care company has contracts with or will negotiate with certain facilities, including the following:   (YES)     Yes   Patient/family informed of bed offers received.  Patient chooses bed at  [facility] )     Physician recommends and patient chooses bed at      Patient to be transferred to  [facility] ) on 02/27/15.  Patient to be transferred to facility by  [facility] )     Patient family notified on 02/27/15 of transfer.  Name of family member notified:   (Pt's dtr, Willette PaJulie Brown)     PHYSICIAN Please sign DNR, Please sign FL2      Additional Comment:    _______________________________________________ Vaughan BrownerNixon, Romelo Sciandra A, LCSW 02/26/2015, 4:13 PM

## 2015-02-27 MED ORDER — MORPHINE SULFATE 10 MG/5ML PO SOLN: 2.5000 mg | ORAL | Status: AC | PRN

## 2015-02-27 MED ORDER — BISACODYL 10 MG RE SUPP: 10.0000 mg | Freq: Every day | RECTAL | Status: DC | PRN

## 2015-02-27 MED ORDER — PANTOPRAZOLE SODIUM 40 MG IV SOLR
40.0000 mg | INTRAVENOUS | Status: DC
  Administered 2015-02-27: 40 mg via INTRAVENOUS
  Filled 2015-02-27: qty 40

## 2015-02-27 MED ORDER — RANITIDINE HCL 150 MG/10ML PO SYRP: 150.0000 mg | ORAL_SOLUTION | Freq: Two times a day (BID) | ORAL | Status: AC

## 2015-02-27 MED ORDER — MORPHINE SULFATE (CONCENTRATE) 10 MG/0.5ML PO SOLN: 2.5000 mg | ORAL | Status: DC | PRN

## 2015-02-27 MED ORDER — MORPHINE SULFATE 2 MG/ML IJ SOLN
1.0000 mg | INTRAMUSCULAR | Status: DC | PRN
  Administered 2015-02-27: 1 mg via INTRAVENOUS
  Filled 2015-02-27: qty 1

## 2015-02-27 MED ORDER — BISACODYL 10 MG RE SUPP: 10.0000 mg | Freq: Every day | RECTAL | Status: AC | PRN

## 2015-02-27 MED ORDER — MORPHINE SULFATE 10 MG/5ML PO SOLN: 2.5000 mg | ORAL | Status: DC | PRN

## 2015-02-27 NOTE — Discharge Summary (Addendum)
Physician Discharge Summary  Alice Swanson JYN:[id number] DOB: 1920-03-16 DOA: 02/23/2015  PCP: Kimber Relic, MD  Admit date: 02/23/2015 Discharge date: 02/27/2015  Time spent: 35 minutes  Recommendations for Outpatient Follow-up:  Needs to be follow by hospice.  Patient is now comfort care.   Discharge Diagnoses:    Aphasia   Cerebrovascular disease   Acute CVA (cerebrovascular accident)   Essential hypertension   Hyperlipidemia   Cerebral infarction due to thrombosis of basilar artery   Dysarthria   Dysphagia, pharyngoesophageal phase   Palliative care encounter   Discharge Condition: stable  Diet recommendation: Comfort feeding  Filed Weights   02/23/15 0440  Weight: 57.4 kg (126 lb 8.7 oz)    History of present illness:  Alice Swanson is a 81 y.o. female h/o cerebrovascular disease with TIA and cerebellar infarction last November, patient presents to the ED with aphasia. Patient developed symptoms of abnormal gait, weakness, and aphasia 7 hours PTA at about 6:00 PM yesterday evening. She is typically able to ambulate on her own and speak fluently. Facility reports abnormal speech as well as urinary incontinence at shift change at midnight.  Interim history Patient did have an acute CVA on MRI. Echocardiogram still pending. PT and OT recommended. Patient failed her swallow eval and MBS. She will likely need a PEG tube. Suspected daughter regarding this. Neurology recommended aspirin and Plavix for 3 months followed by just Lasix alone once a patient is able to take pills. Also recommending keeping her blood pressure a little higher due to posterior circulation issues.  Hospital Course:  Acute CVA, aphasia -Patient did have recent CVA November 2015 -CT head: No acute findings -MRI brain: Patchy multifocal ischemic infarcts in the pons extending inferiorly towards the brainstem, subcentimeter infarcts involving right cerebellar hemisphere, left PCA territory -Echocardiogram:  Pending -Carotid doppler: 1-39% ICA stenosis, vertebral artery flow is antegrade -LDL 159, needs statin  -hemoglobin A1c 5.5 -PT/OT recommended SNF -Neurology consulted and appreciated, recommended aspirin and Plavix for 3 months then Plavix alone. Also recommended keeping patient's blood pressure a little higher due to posterior circulation issues. -Continue aspirin. Family has decide for no Peg tube. Comfort care.   Palliative care;  Patient appears in pain. Will per daughter patient point abdomen. Will give IV morphine. Will order laxative. Discharge on ranitidine liquid.   Will provide liquid morphine at discharge  Hypertension -resume medications.   Hyperlipidemia -Lipid panel: TC 240, TG 134, HDL 54, LDL 159 -Lipitor ordered, however, patient is still NPO after MBS Comfort care now.   History of coronary artery disease -currently chest pain-free -troponin negative  Procedures:  ECHO;   Consultations:  Neurology  Palliative  Discharge Exam: Filed Vitals:   02/27/15 0702  BP: 185/58  Pulse: 66  Temp: 97.7 F (36.5 C)  Resp: 20    General: Alert , aphasic Cardiovascular: S 1, S 2 RRR Respiratory: CTA  Discharge Instructions   Discharge Instructions    Ambulatory referral to Neurology    Complete by:  As directed   Pt will follow up with Dr. Roda Shutters at [facility] in about 2 months. Thanks.     Increase activity slowly    Complete by:  As directed           Current Discharge Medication List    START taking these medications   Details  bisacodyl (DULCOLAX) 10 MG suppository Place 1 suppository (10 mg total) rectally daily as needed for moderate constipation. Qty: 12 suppository, Refills: 0  morphine 10 MG/5ML solution Take 1.3 mLs (2.6 mg total) by mouth every 4 (four) hours as needed for severe pain. Qty: 15 mL, Refills: 0      CONTINUE these medications which have NOT CHANGED   Details  dorzolamide (TRUSOPT) 2 % ophthalmic solution Use one drop in  both eyes at bedtime Refills: 98    losartan (COZAAR) 50 MG tablet TAKE 1 TABLET EVERY DAY TO CONTROL BLOOD PRESSURE Qty: 30 tablet, Refills: 3    timolol (TIMOPTIC) 0.5 % ophthalmic solution One drop in both eyes at bedtime Refills: 98    aspirin 325 MG tablet Take 1 tablet (325 mg total) by mouth daily.    polyethylene glycol (MIRALAX / GLYCOLAX) packet Take 17 g by mouth as needed.      STOP taking these medications     FLUoxetine (PROZAC) 20 MG tablet      Multiple Vitamins-Minerals (ICAPS) CAPS      potassium chloride SA (K-DUR,KLOR-CON) 20 MEQ tablet      erythromycin ophthalmic ointment      fluticasone (FLONASE) 50 MCG/ACT nasal spray      loratadine (CLARITIN) 10 MG tablet      mirtazapine (REMERON) 7.5 MG tablet        Allergies  Allergen Reactions  . Penicillins Other (See Comments)    On MAR   Follow-up Information    Follow up with Xu,Jindong, MD. Schedule an appointment as soon as possible for a visit in 2 months.   Specialty:  Neurology   Why:  stroke clinic   Contact information:   [address] [place] [id number] [phone]        The results of significant diagnostics from this hospitalization (including imaging, microbiology, ancillary and laboratory) are listed below for reference.    Significant Diagnostic Studies: Ct Head Wo Contrast  02/23/2015   CLINICAL DATA:  Bilateral lower extremity weakness  EXAM: CT HEAD WITHOUT CONTRAST  TECHNIQUE: Contiguous axial images were obtained from the base of the skull through the vertex without intravenous contrast.  COMPARISON:  09/01/2014, 08/31/2014.  FINDINGS: There is no intracranial hemorrhage, mass or evidence of acute infarction. There is no extra-axial fluid collection. There is moderately severe generalized atrophy and hemispheric hypodensity consistent with chronic small vessel ischemic disease. There is prior lacunar infarction in the left caudate head. No acute bony  abnormalities are evident. Incidentally noted right frontal sinus osteoma.  IMPRESSION: Moderately severe generalized atrophy and chronic small vessel ischemic disease. No acute findings.   Electronically Signed   By: Ellery Plunkaniel R Mitchell M.D.   On: 02/23/2015 01:35   Mr Brain Wo Contrast  02/23/2015   CLINICAL DATA:  Initial evaluation for acute aphasia, dysarthria. Gait instability.  EXAM: MRI HEAD WITHOUT CONTRAST  MRA HEAD WITHOUT CONTRAST  TECHNIQUE: Multiplanar, multiecho pulse sequences of the brain and surrounding structures were obtained without intravenous contrast. Angiographic images of the head were obtained using MRA technique without contrast.  COMPARISON:  Prior CT from earlier the same day as well as previous MRI from 09/01/2014.  FINDINGS: MRI HEAD FINDINGS  Diffuse prominence of the CSF containing spaces is compatible with generalized cerebral atrophy. Extensive patchy and confluent T2/FLAIR hyperintensity within the periventricular and deep white matter both cerebral hemispheres again seen, most compatible with advanced chronic microvascular ischemic disease. Similar changes seen within the pons.  There are patchy multi focal ischemic infarcts involving the central pons, with some extension inferiorly towards the upper brainstem (series 3, image  15). Additional multi focal subcentimeter ischemic infarcts involving the right cerebellar hemisphere (series 3, image 13). A in 7 mm mildly hyperintense focus of restricted diffusion within the posterior right middle cerebellar peduncle appears more subacute in nature (series 3, image 11). Additional patchy subcentimeter ischemic infarcts within the medial left occipital lobe as well as the mid and left aspect of the splenium in the left PCA territory (series 3, image 24 image 18, 24). There is question of small subacute ischemic infarct within the left cerebellar hemisphere as well (series 3, image 9), as well as the left thalamus (series 3, image 25).  There is question of tiny associated petechial hemorrhage within the central pons (series 9, image 8). No hemorrhagic transformation. No other chronic or acute intracranial hemorrhage. Absent flow void within the distal vertebral arteries and proximal -mid basilar artery.  No mass lesion or midline shift.  No extra-axial fluid collection.  Craniocervical junction within normal limits. Scattered multilevel degenerative changes present within the visualized upper cervical spine. Pituitary gland normal.  No acute abnormality about the orbits.  Scattered mild mucoperiosteal thickening present within the ethmoidal air cells and maxillary sinuses. No mastoid effusion. Inner ear structures normal.  Bone marrow signal intensity normal. Scalp soft tissues unremarkable.  MRA HEAD FINDINGS  ANTERIOR CIRCULATION:  Visualized distal cervical segments of the internal carotid arteries are patent with antegrade flow. The petrous, cavernous, and supra clinoid segments are patent bilaterally. A1 segments are patent but demonstrate multi focal atheromatous irregularity. Multi focal air atheromatous irregularity present within the anterior cerebral arteries is well which are opacified to their distal aspects. There are superimposed moderate to severe multi focal stenoses within the left A2 segment.  M1 segments irregular but patent. Focal moderate stenosis within the right M1 segment noted. Irregular somewhat more prevalent within the right M1 segment. Distal MCA branches well opacified.  POSTERIOR CIRCULATION:  Flow was seen within the distal vertebral arteries within the upper neck. There is essentially absent flow within the distal V4 segments bilaterally as they course into the cranial vault. Posterior inferior cerebral arteries not well opacified. There is little to no flow within the basilar artery as well. Posterior cerebral arteries not definitely visualized. Superior cerebellar arteries not visualized either. This is new  relative to most recent MRA from 09/01/2014.  No aneurysm.  IMPRESSION: MRI HEAD IMPRESSION:  1. Patchy multi focal ischemic infarcts within the pons extending inferiorly towards the brainstem, with additional subcentimeter infarcts involving the right cerebellar hemisphere, and left PCA territory. There are probable more subacute infarcts within the right cerebellar hemisphere as well as the left cerebellar hemisphere and left thalamus. No significant mass effect. 2. Advanced cerebral atrophy with chronic microvascular ischemic disease.  MRA HEAD IMPRESSION:  1. Essentially absent flow within the distal vertebral arteries as they course into the cranial vault with absent flow within the basilar artery and its branches. This is new relative to prior study. 2. Patent anterior circulation. There are moderate to severe multi focal atheromatous stenoses within the left A2 segment. Moderate short-segment stenosis within the mid right M1 segment. Multi focal atheromatous irregularity within the distal MCA branches. Results were called by telephone at the time of interpretation on 02/23/2015 at 6:30 am to Dr. Lyda Perone , who verbally acknowledged these results.   Electronically Signed   By: Rise Mu M.D.   On: 02/23/2015 06:42   Mr Maxine Glenn Head/brain Wo Cm  02/23/2015   CLINICAL DATA:  Initial evaluation for acute aphasia,  dysarthria. Gait instability.  EXAM: MRI HEAD WITHOUT CONTRAST  MRA HEAD WITHOUT CONTRAST  TECHNIQUE: Multiplanar, multiecho pulse sequences of the brain and surrounding structures were obtained without intravenous contrast. Angiographic images of the head were obtained using MRA technique without contrast.  COMPARISON:  Prior CT from earlier the same day as well as previous MRI from 09/01/2014.  FINDINGS: MRI HEAD FINDINGS  Diffuse prominence of the CSF containing spaces is compatible with generalized cerebral atrophy. Extensive patchy and confluent T2/FLAIR hyperintensity within the  periventricular and deep white matter both cerebral hemispheres again seen, most compatible with advanced chronic microvascular ischemic disease. Similar changes seen within the pons.  There are patchy multi focal ischemic infarcts involving the central pons, with some extension inferiorly towards the upper brainstem (series 3, image 15). Additional multi focal subcentimeter ischemic infarcts involving the right cerebellar hemisphere (series 3, image 13). A in 7 mm mildly hyperintense focus of restricted diffusion within the posterior right middle cerebellar peduncle appears more subacute in nature (series 3, image 11). Additional patchy subcentimeter ischemic infarcts within the medial left occipital lobe as well as the mid and left aspect of the splenium in the left PCA territory (series 3, image 24 image 18, 24). There is question of small subacute ischemic infarct within the left cerebellar hemisphere as well (series 3, image 9), as well as the left thalamus (series 3, image 25). There is question of tiny associated petechial hemorrhage within the central pons (series 9, image 8). No hemorrhagic transformation. No other chronic or acute intracranial hemorrhage. Absent flow void within the distal vertebral arteries and proximal -mid basilar artery.  No mass lesion or midline shift.  No extra-axial fluid collection.  Craniocervical junction within normal limits. Scattered multilevel degenerative changes present within the visualized upper cervical spine. Pituitary gland normal.  No acute abnormality about the orbits.  Scattered mild mucoperiosteal thickening present within the ethmoidal air cells and maxillary sinuses. No mastoid effusion. Inner ear structures normal.  Bone marrow signal intensity normal. Scalp soft tissues unremarkable.  MRA HEAD FINDINGS  ANTERIOR CIRCULATION:  Visualized distal cervical segments of the internal carotid arteries are patent with antegrade flow. The petrous, cavernous, and supra  clinoid segments are patent bilaterally. A1 segments are patent but demonstrate multi focal atheromatous irregularity. Multi focal air atheromatous irregularity present within the anterior cerebral arteries is well which are opacified to their distal aspects. There are superimposed moderate to severe multi focal stenoses within the left A2 segment.  M1 segments irregular but patent. Focal moderate stenosis within the right M1 segment noted. Irregular somewhat more prevalent within the right M1 segment. Distal MCA branches well opacified.  POSTERIOR CIRCULATION:  Flow was seen within the distal vertebral arteries within the upper neck. There is essentially absent flow within the distal V4 segments bilaterally as they course into the cranial vault. Posterior inferior cerebral arteries not well opacified. There is little to no flow within the basilar artery as well. Posterior cerebral arteries not definitely visualized. Superior cerebellar arteries not visualized either. This is new relative to most recent MRA from 09/01/2014.  No aneurysm.  IMPRESSION: MRI HEAD IMPRESSION:  1. Patchy multi focal ischemic infarcts within the pons extending inferiorly towards the brainstem, with additional subcentimeter infarcts involving the right cerebellar hemisphere, and left PCA territory. There are probable more subacute infarcts within the right cerebellar hemisphere as well as the left cerebellar hemisphere and left thalamus. No significant mass effect. 2. Advanced cerebral atrophy with chronic microvascular ischemic disease.  MRA HEAD IMPRESSION:  1. Essentially absent flow within the distal vertebral arteries as they course into the cranial vault with absent flow within the basilar artery and its branches. This is new relative to prior study. 2. Patent anterior circulation. There are moderate to severe multi focal atheromatous stenoses within the left A2 segment. Moderate short-segment stenosis within the mid right M1 segment.  Multi focal atheromatous irregularity within the distal MCA branches. Results were called by telephone at the time of interpretation on 02/23/2015 at 6:30 am to Dr. Lyda Perone , who verbally acknowledged these results.   Electronically Signed   By: Rise Mu M.D.   On: 02/23/2015 06:42    Microbiology: No results found for this or any previous visit (from the past 240 hour(s)).   Labs: Basic Metabolic Panel:  Recent Labs Lab 02/23/15 0121 02/23/15 0125 02/25/15 0406 02/26/15 0818  NA 139 143 140 140  K 3.6 3.6 3.0* 3.7  CL 110 108 112* 112*  CO2 21*  --  18* 16*  GLUCOSE 127* 127* 80 76  BUN 19 21* 18 18  CREATININE 0.81 0.70 0.67 0.78  CALCIUM 8.9  --  8.5* 8.7*   Liver Function Tests:  Recent Labs Lab 02/23/15 0121  AST 20  ALT 10*  ALKPHOS 80  BILITOT 1.0  PROT 6.8  ALBUMIN 3.5   No results for input(s): LIPASE, AMYLASE in the last 168 hours. No results for input(s): AMMONIA in the last 168 hours. CBC:  Recent Labs Lab 02/23/15 0121 02/23/15 0125 02/25/15 0406  WBC 10.0  --  7.0  NEUTROABS 6.8  --   --   HGB 14.1 15.0 13.5  HCT 42.3 44.0 41.5  MCV 93.4  --  92.8  PLT 179  --  152   Cardiac Enzymes: No results for input(s): CKTOTAL, CKMB, CKMBINDEX, TROPONINI in the last 168 hours. BNP: BNP (last 3 results) No results for input(s): BNP in the last 8760 hours.  ProBNP (last 3 results) No results for input(s): PROBNP in the last 8760 hours.  CBG: No results for input(s): GLUCAP in the last 168 hours.     SignedHartley Barefoot A  [facility] 02/27/2015, 10:04 AM

## 2015-02-27 NOTE — Clinical Social Work Note (Signed)
Clinical Social Worker met with pt's dtr, Almyra Free present at bedside to confirm discharge plans for SNF, [place] with hospice care.    Pt's dtr agreeable with pt returning to [facility] as a SNF resident with hospice care. Pt's dtr further reported she does not know how much her mother understood from the palliative care meeting. Pt's dtr stated "I don't want her to be afraid of dying. I want her comfortable and peaceful". CSW processed and validated pt's dtr's feelings and concerns. Pt's dtr further stated her sister would be traveling from [place] on Monday.   CSW will continue to follow pt and pt's family for continued support and facilitate pt's discharge needs once medically stable.  FL-2 and DNR form on chart for MD signature.  Glendon Axe, MSW, LCSWA [phone] 02/27/2015 10:04 AM

## 2015-02-27 NOTE — Clinical Social Work Note (Addendum)
Clinical Social Worker facilitated patient discharge including contacting patient family and facility to confirm patient discharge plans.  Clinical information faxed to facility and family agreeable with plan.  CSW arranged ambulance transport via [facility] to [facility] with hospice care services.  RN to call report prior to discharge.  DC packet prepared and on chart for transport.  Clinical Social Worker will sign off for now as social work intervention is no longer needed. Please consult us again if new need arises.  Derenda FennelBashira Alyana Kreiter, MSW, LCSWA [phone] 02/27/2015 10:51 AM

## 2015-02-27 NOTE — Progress Notes (Signed)
OT NOTE  Pt noted to be comfort measures with d/c to SNF with hospice care. Ot to sign off acutely.   Mateo FlowJones, Brynn   OTR/L Pager: [phone] Office: [phone] .

## 2015-02-27 NOTE — Progress Notes (Signed)
Attempted to call for report to [facility] but noone answered. Will call back. Family aware.   Rupa Lagan,RN

## 2015-02-27 NOTE — Progress Notes (Signed)
Daily Progress Note   Patient Name: Alice Swanson       Date: 02/27/2015 DOB: [birth year]  Age: 81 y.o. MRN#: [id number] Attending Physician: Alba CoryBelkys A Regalado, MD Primary Care Physician: Kimber RelicGREEN, ARTHUR G, MD Admit Date: 02/23/2015  Reason for Consultation/Follow-up: Disposition, Establishing goals of care and Terminal care  Subjective: Mrs. Alice NimsVAil had some right leg pain with adequate relief with roxanol. She is frustrated that she cannot speak. Interval Events: No events Length of Stay: 4 days  Current Medications: Scheduled Meds:   antiseptic oral rinse  7 mL Mouth Rinse q12n4p   aspirin  300 mg Rectal Daily   Or   aspirin  325 mg Oral Daily   chlorhexidine  15 mL Mouth Rinse BID   dorzolamide  1 drop Both Eyes QHS   erythromycin  1 application Both Eyes QHS   heparin  5,000 Units Subcutaneous 3 times per day   pantoprazole (PROTONIX) IV  40 mg Intravenous Q24H   timolol  1 drop Both Eyes QHS    Continuous Infusions:  sodium chloride 100 mL/hr at 02/27/15 0039    PRN Meds: acetaminophen, bisacodyl, hydrALAZINE, morphine injection, morphine CONCENTRATE  Palliative Performance Scale: 10%     Vital Signs: BP 175/56 mmHg   Pulse 66   Temp(Src) 97.9 F (36.6 C) (Oral)   Resp 20   Ht 5' (1.524 m)   Wt 57.4 kg (126 lb 8.7 oz)   BMI 24.71 kg/m2   SpO2 97% SpO2: SpO2: 97 % O2 Device: O2 Device: Not Delivered O2 Flow Rate:    Intake/output summary:  Intake/Output Summary (Last 24 hours) at 02/27/15 1156 Last data filed at 02/26/15 1900  Gross per 24 hour  Intake 3730.42 ml  Output      0 ml  Net 3730.42 ml   LBM:   Baseline Weight: Weight: 57.4 kg (126 lb 8.7 oz) Most recent weight: Weight: 57.4 kg (126 lb 8.7 oz)  Physical Exam: gen'l Elderly white woman in no distress at this time Neuro - dense expressive aphasia, decreased mobility R UE and little LE movement.              Additional Data Reviewed: Recent Labs     02/25/15  0406  02/26/15  0818  WBC   7.0   --   HGB  13.5   --   PLT  152   --   NA  140  140  BUN  18  18  CREATININE  0.67  0.78     Problem List:  Patient Active Problem List   Diagnosis Date Noted   Dysphagia, pharyngoesophageal phase    Palliative care encounter    Aphasia 02/23/2015   Acute CVA (cerebrovascular accident)    Essential hypertension    Hyperlipidemia    Cerebral infarction due to thrombosis of basilar artery    Dysarthria    Blepharitis of both eyes 10/30/2014   Sinusitis, chronic 10/30/2014   Cerebral atrophy    Hypokalemia 09/03/2014   Bradycardia 09/01/2014   TIA (transient ischemic attack) 08/31/2014   Cerebellar infarction 08/31/2014   Cerebrovascular disease 08/31/2014   Memory deficits 01/12/2014   Constipation 01/12/2014   Abnormality of gait 08/29/2013   Sciatic pain 05/05/2013   Hemorrhoids, external 05/05/2013   Hypertension 08/10/2012   Depression 08/10/2012   Anxiety 08/10/2012   Hypercholesteremia 08/10/2012   Allergic rhinitis 08/10/2012     Palliative Care Assessment & Plan    Code Status:  DNR  Goals of Care:  Terminal comfort care  Desire for further Chaplaincy support:no  3. Symptom Management:  Limited amount of pain controlled.  4. Palliative Prophylaxis:  Stool Softner: NA  5. Prognosis: < 2 weeks  5. Discharge Planning: Skilled Nursing Facility with Hospice   Care plan was discussed with daughter. All questions answered. No change in Plan. Reviewed and executed MOST, dtr to take original to SNF, copy for records to SNF and to be scanned.  Thank you for allowing the Palliative Medicine Team to assist in the care of this patient.   Time In: 1155 Time Out: 1225 Total Time 30 Prolonged Time Billed  no     Greater than 50%  of this time was spent counseling and coordinating care related to the above assessment and plan.   Illene RegulusMichael Norins, MD Palliative care team   02/27/2015, 11:56 AM  Please contact Palliative  Medicine Team phone at [phone] for questions and concerns.

## 2015-03-02 ENCOUNTER — Non-Acute Institutional Stay (SKILLED_NURSING_FACILITY): Payer: Medicare Other | Admitting: Nurse Practitioner

## 2015-03-02 ENCOUNTER — Encounter: Payer: Self-pay | Admitting: Nurse Practitioner

## 2015-03-02 DIAGNOSIS — R1314 Dysphagia, pharyngoesophageal phase: Secondary | ICD-10-CM

## 2015-03-02 DIAGNOSIS — E876 Hypokalemia: Secondary | ICD-10-CM

## 2015-03-02 DIAGNOSIS — R413 Other amnesia: Secondary | ICD-10-CM | POA: Diagnosis not present

## 2015-03-02 DIAGNOSIS — F419 Anxiety disorder, unspecified: Secondary | ICD-10-CM | POA: Diagnosis not present

## 2015-03-02 DIAGNOSIS — I639 Cerebral infarction, unspecified: Secondary | ICD-10-CM | POA: Diagnosis not present

## 2015-03-02 DIAGNOSIS — F329 Major depressive disorder, single episode, unspecified: Secondary | ICD-10-CM

## 2015-03-02 DIAGNOSIS — K5900 Constipation, unspecified: Secondary | ICD-10-CM

## 2015-03-02 DIAGNOSIS — I10 Essential (primary) hypertension: Secondary | ICD-10-CM | POA: Diagnosis not present

## 2015-03-02 DIAGNOSIS — F32A Depression, unspecified: Secondary | ICD-10-CM

## 2015-03-02 NOTE — Assessment & Plan Note (Signed)
Comfort measures only

## 2015-03-02 NOTE — Assessment & Plan Note (Signed)
-  Patient did have recent CVA November 2015 -CT head: No acute findings -MRI brain: Patchy multifocal ischemic infarcts in the pons extending inferiorly towards the brainstem, subcentimeter infarcts involving right cerebellar hemisphere, left PCA territory -Carotid doppler: 1-39% ICA stenosis, vertebral artery flow is antegrade -LDL 159, family whishes comfort care. Patient not toleration orals.  -hemoglobin A1c 5.5 -diet as tolerated-ice chips and nectar thick liquid. Hospice service and comfort measures.  -PT/OT recommended SNF -Neurology consulted and appreciated, recommended aspirin and Plavix for 3 months then Plavix alone. Also recommended keeping patient's blood pressure a little higher due to posterior circulation issues. -Continue aspirin rectally -family has decide to proceed with comfort care. No Peg tube.  Continue with IV fluids until transfer to facility.

## 2015-03-02 NOTE — Assessment & Plan Note (Signed)
Hx of Prozac 20mg , 02/05/15 c/o too sleepy-off Mirtazapine. Now off antidepressants.

## 2015-03-02 NOTE — Assessment & Plan Note (Signed)
Off Prozac and Mirtazapine.

## 2015-03-02 NOTE — Assessment & Plan Note (Signed)
Continue to decline since the acute CVA

## 2015-03-02 NOTE — Assessment & Plan Note (Signed)
Diet modification as tolerated.

## 2015-03-02 NOTE — Progress Notes (Signed)
Patient ID: Alice Swanson, female   DOB: 04-30-1920, 81 y.o.   MRN: [id number]   Code Status: DNR  Allergies  Allergen Reactions  . Penicillins Other (See Comments)    On [facility]    Chief Complaint  Patient presents with  . Acute Visit    end of life care  . Medical Management of Chronic Issues    HPI: Patient is a 81 y.o. female seen in the SNF at [facility] today for end of life care.     Problem List Items Addressed This Visit    Depression (Chronic)    Hx of Prozac , 02/05/15 c/o too sleepy-off Mirtazapine. Now off antidepressants.       Anxiety (Chronic)    Off Prozac and Mirtazapine.       Memory deficits (Chronic)    Continue to decline since the acute CVA      Hypokalemia (Chronic)    Comfort measures only.       Hypertension    Comfort measures only.       Constipation    Suppository prn.       Acute CVA (cerebrovascular accident) - Primary    -Patient did have recent CVA November 2015 -CT head: No acute findings -MRI brain: Patchy multifocal ischemic infarcts in the pons extending inferiorly towards the brainstem, subcentimeter infarcts involving right cerebellar hemisphere, left PCA territory -Carotid doppler: 1-39% ICA stenosis, vertebral artery flow is antegrade -LDL 159, family whishes comfort care. Patient not toleration orals.  -hemoglobin A1c 5.5 -diet as tolerated-ice chips and nectar thick liquid. Hospice service and comfort measures.  -PT/OT recommended SNF -Neurology consulted and appreciated, recommended aspirin and Plavix for 3 months then Plavix alone. Also recommended keeping patient's blood pressure a little higher due to posterior circulation issues. -Continue aspirin rectally -family has decide to proceed with comfort care. No Peg tube.  Continue with IV fluids until transfer to facility.        Dysphagia, pharyngoesophageal phase    Diet modification as tolerated.          Review of Systems:  Review of Systems    Constitutional: Positive for activity change. Negative for fever, chills and diaphoresis.       Generalized and left sided weakness  HENT: Positive for hearing loss. Negative for congestion, ear discharge, ear pain, nosebleeds, sore throat and tinnitus.   Eyes: Negative for discharge and redness.  Respiratory: Negative for cough, shortness of breath, wheezing and stridor.   Cardiovascular: Positive for leg swelling. Negative for chest pain and palpitations.       Trace in ankles.   Gastrointestinal: Negative for nausea, vomiting, abdominal pain, diarrhea, constipation and blood in stool.  Endocrine: Negative for polydipsia.  Genitourinary: Positive for frequency. Negative for dysuria, urgency, hematuria and flank pain.  Musculoskeletal: Positive for gait problem. Negative for myalgias, back pain and neck pain.       Ambulates with walker. Slow and wobbly. falling  Skin: Negative for rash.  Allergic/Immunologic: Negative for environmental allergies.  Neurological: Positive for weakness. Negative for dizziness, tremors, seizures and headaches.       Left sided weakness.   Hematological: Does not bruise/bleed easily.  Psychiatric/Behavioral: Negative for suicidal ideas and hallucinations. The patient is nervous/anxious.        Sad facial looks.       Past Medical History  Diagnosis Date  . Arthritis   . Depression   . Glaucoma     both eyes  .  Hyperlipidemia   . Hypertension   . Cardiac arrhythmia due to congenital heart disease   . Sciatica   . Anxiety   . Allergic rhinitis   . Macular degeneration of both eyes   . TIA (transient ischemic attack) 08/31/14  . Bradycardia 08/31/14  . Heart block AV first degree 08/31/14  . Memory deficit 08/31/14  . Cerebral atrophy 08/31/14  . Cerebrovascular disease 08/31/14  . Cerebellar infarction 08/31/14  . Osteoarthritis 08/31/14  . Hypokalemia    Past Surgical History  Procedure Laterality Date  . Tonsillectomy and adenoidectomy   1926  . Appendectomy  1932  . Abdominal hysterectomy  1962  . Cataract extraction w/ intraocular lens implant Right 12/2011    Dr. Pricilla Holm  . Cataract extraction w/ intraocular lens implant Left 02/2013    Dr. Dione Booze  . Colonoscopy  2005   Social History:   reports that she has never smoked. She has never used smokeless tobacco. She reports that she does not drink alcohol or use illicit drugs.  Family History  Problem Relation Age of Onset  . Heart disease Mother     MI  . Stroke Father     Medications: Patient's Medications  New Prescriptions   No medications on file  Previous Medications   ASPIRIN 325 MG TABLET    Take 1 tablet (325 mg total) by mouth daily.   BISACODYL (DULCOLAX) 10 MG SUPPOSITORY    Place 1 suppository (10 mg total) rectally daily as needed for moderate constipation.   DORZOLAMIDE (TRUSOPT) 2 % OPHTHALMIC SOLUTION    Use one drop in both eyes at bedtime   LOSARTAN (COZAAR) 50 MG TABLET    TAKE 1 TABLET EVERY DAY TO CONTROL BLOOD PRESSURE   MORPHINE 10 MG/5ML SOLUTION    Take 1.3 mLs (2.6 mg total) by mouth every 4 (four) hours as needed for severe pain.   POLYETHYLENE GLYCOL (MIRALAX / GLYCOLAX) PACKET    Take 17 g by mouth as needed.   RANITIDINE (ZANTAC) 150 MG/10ML SYRUP    Take 10 mLs (150 mg total) by mouth 2 (two) times daily.   TIMOLOL (TIMOPTIC) 0.5 % OPHTHALMIC SOLUTION    One drop in both eyes at bedtime  Modified Medications   No medications on file  Discontinued Medications   No medications on file     Physical Exam: Physical Exam  Constitutional: She is oriented to person, place, and time. She appears well-developed and well-nourished. No distress.  HENT:  Head: Normocephalic and atraumatic.  Right Ear: External ear normal.  Left Ear: External ear normal.  Nose: Nose normal.  Mouth/Throat: Oropharynx is clear and moist. No oropharyngeal exudate.  Eyes: EOM are normal. Pupils are equal, round, and reactive to light. Right eye exhibits no  discharge. Left eye exhibits no discharge. Right conjunctiva is injected. No scleral icterus.  Neck: Normal range of motion. Neck supple. No JVD present. No tracheal deviation present. No thyromegaly present.  Cardiovascular: Normal rate, regular rhythm, normal heart sounds and intact distal pulses.   No murmur heard. Pulmonary/Chest: Effort normal and breath sounds normal. No stridor. No respiratory distress. She has no wheezes. She has no rales. She exhibits no tenderness.  Abdominal: Soft. Bowel sounds are normal. She exhibits no distension. There is no tenderness. There is no rebound and no guarding.  Genitourinary: Vagina normal. No vaginal discharge found.  External hemorrhoids-stable.   Musculoskeletal: Normal range of motion. She exhibits no edema or tenderness.  Lymphadenopathy:  She has no cervical adenopathy.  Neurological: She is alert and oriented to person, place, and time. She displays abnormal reflex. No cranial nerve deficit. She exhibits abnormal muscle tone. Coordination abnormal.  Left sided weakness.   Skin: Skin is warm and dry. No rash noted. She is not diaphoretic. No erythema. No pallor.  Psychiatric: Her mood appears anxious. Her speech is not rapid and/or pressured, not delayed, not tangential and not slurred. She is aggressive. She is not agitated, not hyperactive, not slowed, not withdrawn, not actively hallucinating and not combative. Thought content is not paranoid and not delusional. Cognition and memory are not impaired. She expresses impulsivity. She does not express inappropriate judgment. She exhibits a depressed mood. She expresses no homicidal and no suicidal ideation. She expresses no suicidal plans and no homicidal plans. She is communicative. She exhibits normal recent memory and normal remote memory. She is attentive.    Filed Vitals:   03/02/15 1632  BP: 130/70  Pulse: 98  Temp: 99.6 F (37.6 C)  TempSrc: Tympanic  Resp: 18      Labs  reviewed: Basic Metabolic Panel:  Recent Labs  14/78/2911/19/15  02/23/15 0121 02/23/15 0125 02/25/15 0406 02/26/15 0818  NA 142  < > 139 143 140 140  K 4.0  < > 3.6 3.6 3.0* 3.7  CL  --   --  110 108 112* 112*  CO2  --   --  21*  --  18* 16*  GLUCOSE  --   --  127* 127* 80 76  BUN 19  < > 19 21* 18 18  CREATININE  --   < > 0.81 0.70 0.67 0.78  CALCIUM  --   --  8.9  --  8.5* 8.7*  TSH 1.94  --   --   --   --   --   < > = values in this interval not displayed. Liver Function Tests:  Recent Labs  08/31/14 1404 09/04/14 01/20/15 02/23/15 0121  AST 17 19 15 20   ALT 11 13 8  10*  ALKPHOS 93 82 74 80  BILITOT 0.3  --   --  1.0  PROT 7.3  --   --  6.8  ALBUMIN 3.2*  --   --  3.5   No results for input(s): LIPASE, AMYLASE in the last 8760 hours. No results for input(s): AMMONIA in the last 8760 hours. CBC:  Recent Labs  08/31/14 1404  01/20/15 02/23/15 0121 02/23/15 0125 02/25/15 0406  WBC 7.8  --  7.0 10.0  --  7.0  NEUTROABS 4.4  --   --  6.8  --   --   HGB 13.4  < > 13.6 14.1 15.0 13.5  HCT 41.1  < > 41 42.3 44.0 41.5  MCV 92.6  --   --  93.4  --  92.8  PLT 217  --  177 179  --  152  < > = values in this interval not displayed. Lipid Panel:  Recent Labs  09/01/14 0445 02/23/15 0630  CHOL 198 240*  HDL 46 54  LDLCALC 132* 159*  TRIG 101 134  CHOLHDL 4.3 4.4   Anemia Panel: No results for input(s): FOLATE, IRON, VITAMINB12 in the last 8760 hours.  Past Procedures:   08/31/14 CT head w/o contrast:  IMPRESSION: 1. No acute intracranial abnormality. 2. Cerebral atrophy and small vessel ischemic change. 3. Sinus disease.  09/01/14 MRI brain, MRA head  IMPRESSION: No acute infarction. Extensive chronic small vessel  ischemic changes throughout the brain as outlined above.  No major vessel intracranial occlusion. Focal stenoses within the A1 and M1 segments on the right. 50% stenosis of the right M1 segment for place the patient at risk of right MCA  territory infarction. Diffuse atherosclerotic narrowing and irregularity within the major posterior circulation branch vessels.  09/01/14 echocardiogram: EF 55-60%  Assessment/Plan Acute CVA (cerebrovascular accident) -Patient did have recent CVA November 2015 -CT head: No acute findings -MRI brain: Patchy multifocal ischemic infarcts in the pons extending inferiorly towards the brainstem, subcentimeter infarcts involving right cerebellar hemisphere, left PCA territory -Carotid doppler: 1-39% ICA stenosis, vertebral artery flow is antegrade -LDL 159, family whishes comfort care. Patient not toleration orals.  -hemoglobin A1c 5.5 -diet as tolerated-ice chips and nectar thick liquid. Hospice service and comfort measures.  -PT/OT recommended SNF -Neurology consulted and appreciated, recommended aspirin and Plavix for 3 months then Plavix alone. Also recommended keeping patient's blood pressure a little higher due to posterior circulation issues. -Continue aspirin rectally -family has decide to proceed with comfort care. No Peg tube.  Continue with IV fluids until transfer to facility.     Depression Hx of Prozac 20mg , 02/05/15 c/o too sleepy-off Mirtazapine. Now off antidepressants.    Anxiety Off Prozac and Mirtazapine.    Memory deficits Continue to decline since the acute CVA   Hypokalemia Comfort measures only.    Hypertension Comfort measures only.    Constipation Suppository prn.    Dysphagia, pharyngoesophageal phase Diet modification as tolerated.      Family/ Staff Communication: observe the patient.   Goals of Care: SNF Hospice Service.   Labs/tests ordered: none

## 2015-03-02 NOTE — Assessment & Plan Note (Signed)
Suppository prn.

## 2015-03-09 ENCOUNTER — Encounter: Payer: Self-pay | Admitting: Internal Medicine

## 2015-03-09 ENCOUNTER — Non-Acute Institutional Stay (SKILLED_NURSING_FACILITY): Payer: Medicare Other | Admitting: Internal Medicine

## 2015-03-09 DIAGNOSIS — I10 Essential (primary) hypertension: Secondary | ICD-10-CM

## 2015-03-09 DIAGNOSIS — E785 Hyperlipidemia, unspecified: Secondary | ICD-10-CM

## 2015-03-09 DIAGNOSIS — I679 Cerebrovascular disease, unspecified: Secondary | ICD-10-CM | POA: Diagnosis not present

## 2015-03-09 DIAGNOSIS — R1314 Dysphagia, pharyngoesophageal phase: Secondary | ICD-10-CM

## 2015-03-09 DIAGNOSIS — R4701 Aphasia: Secondary | ICD-10-CM | POA: Diagnosis not present

## 2015-03-09 DIAGNOSIS — I639 Cerebral infarction, unspecified: Secondary | ICD-10-CM | POA: Diagnosis not present

## 2015-03-09 DIAGNOSIS — R413 Other amnesia: Secondary | ICD-10-CM | POA: Diagnosis not present

## 2015-03-09 DIAGNOSIS — R471 Dysarthria and anarthria: Secondary | ICD-10-CM

## 2015-03-09 NOTE — Progress Notes (Signed)
Patient ID: Alice Swanson, female   DOB: [birth year], 81 y.o.   MRN: [id number]    HISTORY AND PHYSICAL  Location:  [facility] Room Number: 109 Place of Service: SNF (31)   Extended Emergency Contact Information Primary Emergency Contact: [place] of [place] Phone: [phone] Mobile Phone: [phone] Relation: Daughter  Advanced Directive information Does patient have an advance directive?: Yes, Type of Advance Directive: Living will (Patient is nonverbal and has dysarthria. Family member speaking on her behalf directed her conditions to be comfort care measures only. She is not to have PEG or prolonged artificial feeding area), Does patient want to make changes to advanced directive?: No - Patient declined  Chief Complaint  Patient presents with  . New Admit To SNF    Following hospitalization    HPI:  Patient was admitted to skilled nursing facility at the memory care level on 02/27/2015. She was hospitalized 02/23/2015 through 02/27/2015 for an acute CVA. Patient had an acute CVA November 2015 and was recovering from that. On 02/23/2015, she had dysarthria and aphasia. She was sent to the emergency room. MRI showed multifocal ischemic infarcts in the pons, cerebrovascular disease, and cerebral atrophy. Further evaluation disclosed dysarthria and pharyngoesophageal dysphagia. Expressive aphasia is felt most likely to be a bBroca's aphasia.  Other tests included carotid duplex and an echocardiogram these tests were basically unremarkable.  While hospitalized, she had palliative and hospice consult. Family electedt no artificial feeding and comfort care only.  She has multiple other active medical conditions listed below.  Hypertension remains under control.  She has a known hyperlipidemia.  While in the hospital, progress notes indicate that she had some abdominal pain.  She has become incontinent of both bladder and bowels.      Past Medical History  Diagnosis Date  . Arthritis   . Depression   . Glaucoma     both eyes  . Hyperlipidemia   . Hypertension   . Cardiac arrhythmia due to congenital heart disease   . Sciatica   . Anxiety   . Allergic rhinitis   . Macular degeneration of both eyes   . TIA (transient ischemic attack) 08/31/14  . Bradycardia 08/31/14  . Heart block AV first degree 08/31/14  . Memory deficit 08/31/14  . Cerebral atrophy 08/31/14  . Cerebrovascular disease 08/31/14  . Cerebellar infarction 08/31/14, 02/23/15  . Osteoarthritis 08/31/14  . Hypokalemia   . Dysphagia, pharyngoesophageal phase   . Dysarthria     Past Surgical History  Procedure Laterality Date  . Tonsillectomy and adenoidectomy  1926  . Appendectomy  1932  . Abdominal hysterectomy  1962  . Cataract extraction w/ intraocular lens implant Right 12/2011    Dr. Berline Lopes  . Cataract extraction w/ intraocular lens implant Left 02/2013    Dr. Katy Fitch  . Colonoscopy  2005    Patient Care Team: Estill Dooms, MD as PCP - General (Internal Medicine) Cascade Clent Jacks, MD as Consulting Physician (Ophthalmology) Man Mast Rhunette Croft, NP as Nurse Practitioner (Nurse Practitioner) Rosalin Hawking, MD as Consulting Physician (Neurology)  History   Social History  . Marital Status: Divorced    Spouse Name: N/A  . Number of Children: N/A  . Years of Education: N/A   Occupational History  . Retired Pharmacist, hospital    Social History Main Topics  . Smoking status: Never Smoker   . Smokeless tobacco: Never Used  . Alcohol Use: No  . Drug Use:  No  . Sexual Activity: No   Other Topics Concern  . Not on file   Social History Narrative   Lives at [facility] since 02/2013, moved to [place] 01/28/15. Move to SNF/memory care on 02/27/2015 following an acute CVA.   Divorced   Never smoked   Exercise: none   Previously walked with walker but since her stroke 02/23/2015 has not been walking.                     reports that she has never smoked. She has never used smokeless tobacco. She reports that she does not drink alcohol or use illicit drugs.  Family History  Problem Relation Age of Onset  . Heart disease Mother     MI  . Stroke Father    Family Status  Relation Status Death Age  . Mother Deceased 61  . Father Deceased 78  . Sister Alive   . Daughter Alive   . Daughter Alive     Immunization History  Administered Date(s) Administered  . Influenza Whole 07/17/2012, 07/31/2013  . Influenza-Unspecified 08/04/2014  . PPD Test 01/04/2013  . Pneumococcal Polysaccharide-23 01/04/2013  . Tetanus 01/04/2013    Allergies  Allergen Reactions  . Penicillins Other (See Comments)    On MAR    Medications: Patient's Medications  New Prescriptions   No medications on file  Previous Medications   ASPIRIN 325 MG TABLET    Take 1 tablet (325 mg total) by mouth daily.   BISACODYL (DULCOLAX) 10 MG SUPPOSITORY    Place 1 suppository (10 mg total) rectally daily as needed for moderate constipation.   DORZOLAMIDE (TRUSOPT) 2 % OPHTHALMIC SOLUTION    Use one drop in both eyes at bedtime   LOSARTAN (COZAAR) 50 MG TABLET    TAKE 1 TABLET EVERY DAY TO CONTROL BLOOD PRESSURE   MORPHINE 10 MG/5ML SOLUTION    Take 1.3 mLs (2.6 mg total) by mouth every 4 (four) hours as needed for severe pain.   POLYETHYLENE GLYCOL (MIRALAX / GLYCOLAX) PACKET    Take 17 g by mouth as needed.   RANITIDINE (ZANTAC) 150 MG/10ML SYRUP    Take 10 mLs (150 mg total) by mouth 2 (two) times daily.   TIMOLOL (TIMOPTIC) 0.5 % OPHTHALMIC SOLUTION    One drop in both eyes at bedtime  Modified Medications   No medications on file  Discontinued Medications   No medications on file    Review of Systems  Constitutional: Positive for activity change and fatigue. Negative for fever, chills and diaphoresis.  HENT: Positive for hearing loss. Negative for congestion and ear pain.   Eyes: Positive for visual disturbance.        History of glaucoma and macular degeneration. She sees Dr. Katy Fitch.  Respiratory: Negative.   Cardiovascular: Positive for leg swelling (trace in ankles). Negative for chest pain and palpitations.  Gastrointestinal:       Fecal incontinence  Endocrine: Negative.   Genitourinary: Positive for frequency.       Urine incontinence  Musculoskeletal: Positive for back pain. Negative for myalgias, joint swelling, gait problem, neck pain and neck stiffness.       History of sciatica with involvement of the right leg.  Allergic/Immunologic: Negative.   Neurological:       She was previously evaluated in [place] for episodes of dizziness. She does not recall diagnosis. She is unsure what tests were done, but thinks they included cardiac tests and brain scan.  She continues to have dizzy episodes, but they're not as severe as in the past. She has never had syncope or seizure.  Hematological: Negative.   Psychiatric/Behavioral:       Severe depression which is improving.    Filed Vitals:   03/09/15 1439  BP: 140/60  Pulse: 60  Temp: 98.4 F (36.9 C)  Resp: 16  SpO2: 93%   There is no weight on file to calculate BMI.  Physical Exam  Constitutional: She appears well-developed and well-nourished. No distress.  HENT:  Head: Normocephalic and atraumatic.  Right Ear: External ear normal.  Left Ear: External ear normal.  Nose: Nose normal.  Mouth/Throat: No oropharyngeal exudate.  Eyes: Conjunctivae and EOM are normal. Pupils are equal, round, and reactive to light. Right eye exhibits no discharge. Left eye exhibits no discharge. No scleral icterus.  Neck: Normal range of motion. Neck supple. No JVD present. No tracheal deviation present. No thyromegaly present.  Cardiovascular: Normal rate, regular rhythm, normal heart sounds and intact distal pulses.   No murmur heard. Pulmonary/Chest: Effort normal and breath sounds normal. No stridor. No respiratory distress. She has no wheezes. She has no  rales. She exhibits no tenderness.  Abdominal: Soft. Bowel sounds are normal. She exhibits no distension. There is no tenderness. There is no rebound and no guarding.  Genitourinary: Vagina normal. Guaiac negative stool. No vaginal discharge found.  External hemorrhoids-stable.   Musculoskeletal: Normal range of motion. She exhibits edema (trace in ankles). She exhibits no tenderness.  Lymphadenopathy:    She has no cervical adenopathy.  Neurological: She displays normal reflexes. No cranial nerve deficit. She exhibits normal muscle tone. Coordination abnormal.  01/13/14 MMSE. Passed clock drawing. Drowsy and nonverbal. Weaker on the left side.  Skin: Skin is warm and dry. No rash noted. She is not diaphoretic. No erythema. No pallor.  Senile ecchymoses  Psychiatric: She has a normal mood and affect. Her affect is not angry, not labile and not inappropriate. Her speech is not rapid and/or pressured, not delayed, not tangential and not slurred. She is not agitated, not aggressive, not hyperactive, not slowed, not withdrawn, not actively hallucinating and not combative. Thought content is not paranoid and not delusional. Cognition and memory are impaired. She does not express impulsivity or inappropriate judgment. She expresses no homicidal and no suicidal ideation. She expresses no suicidal plans and no homicidal plans. She is communicative. She exhibits abnormal recent memory. She exhibits normal remote memory.  sad Facial expression She is attentive.     Labs reviewed: Admission on 02/23/2015, Discharged on 02/27/2015  Component Date Value Ref Range Status  . Sodium 02/23/2015 143  135 - 145 mmol/L Final  . Potassium 02/23/2015 3.6  3.5 - 5.1 mmol/L Final  . Chloride 02/23/2015 108  101 - 111 mmol/L Final  . BUN 02/23/2015 21* 6 - 20 mg/dL Final  . Creatinine, Ser 02/23/2015 0.70  0.44 - 1.00 mg/dL Final  . Glucose, Bld 02/23/2015 127* 70 - 99 mg/dL Final  . Calcium, Ion 02/23/2015 1.15   1.13 - 1.30 mmol/L Final  . TCO2 02/23/2015 18  0 - 100 mmol/L Final  . Hemoglobin 02/23/2015 15.0  12.0 - 15.0 g/dL Final  . HCT 02/23/2015 44.0  36.0 - 46.0 % Final  . Alcohol, Ethyl (B) 02/23/2015 <5  <5 mg/dL Final   Comment:        LOWEST DETECTABLE LIMIT FOR SERUM ALCOHOL IS 11 mg/dL FOR MEDICAL PURPOSES ONLY   . Prothrombin Time  02/23/2015 13.7  11.6 - 15.2 seconds Final  . INR 02/23/2015 1.03  0.00 - 1.49 Final  . aPTT 02/23/2015 28  24 - 37 seconds Final  . WBC 02/23/2015 10.0  4.0 - 10.5 K/uL Final   WHITE COUNT CONFIRMED ON SMEAR  . RBC 02/23/2015 4.53  3.87 - 5.11 MIL/uL Final  . Hemoglobin 02/23/2015 14.1  12.0 - 15.0 g/dL Final  . HCT 02/23/2015 42.3  36.0 - 46.0 % Final  . MCV 02/23/2015 93.4  78.0 - 100.0 fL Final  . MCH 02/23/2015 31.1  26.0 - 34.0 pg Final  . MCHC 02/23/2015 33.3  30.0 - 36.0 g/dL Final  . RDW 02/23/2015 14.1  11.5 - 15.5 % Final  . Platelets 02/23/2015 179  150 - 400 K/uL Final  . Neutrophils Relative % 02/23/2015 68  43 - 77 % Final  . Lymphocytes Relative 02/23/2015 23  12 - 46 % Final  . Monocytes Relative 02/23/2015 7  3 - 12 % Final  . Eosinophils Relative 02/23/2015 2  0 - 5 % Final  . Basophils Relative 02/23/2015 0  0 - 1 % Final  . Neutro Abs 02/23/2015 6.8  1.7 - 7.7 K/uL Final  . Lymphs Abs 02/23/2015 2.3  0.7 - 4.0 K/uL Final  . Monocytes Absolute 02/23/2015 0.7  0.1 - 1.0 K/uL Final  . Eosinophils Absolute 02/23/2015 0.2  0.0 - 0.7 K/uL Final  . Basophils Absolute 02/23/2015 0.0  0.0 - 0.1 K/uL Final  . RBC Morphology 02/23/2015 ELLIPTOCYTES   Final  . Sodium 02/23/2015 139  135 - 145 mmol/L Final  . Potassium 02/23/2015 3.6  3.5 - 5.1 mmol/L Final  . Chloride 02/23/2015 110  101 - 111 mmol/L Final  . CO2 02/23/2015 21* 22 - 32 mmol/L Final  . Glucose, Bld 02/23/2015 127* 70 - 99 mg/dL Final  . BUN 02/23/2015 19  6 - 20 mg/dL Final  . Creatinine, Ser 02/23/2015 0.81  0.44 - 1.00 mg/dL Final  . Calcium 02/23/2015 8.9  8.9 - 10.3  mg/dL Final  . Total Protein 02/23/2015 6.8  6.5 - 8.1 g/dL Final  . Albumin 02/23/2015 3.5  3.5 - 5.0 g/dL Final  . AST 02/23/2015 20  15 - 41 U/L Final  . ALT 02/23/2015 10* 14 - 54 U/L Final  . Alkaline Phosphatase 02/23/2015 80  38 - 126 U/L Final  . Total Bilirubin 02/23/2015 1.0  0.3 - 1.2 mg/dL Final  . GFR calc non Af Amer 02/23/2015 >60  >60 mL/min Final  . GFR calc Af Amer 02/23/2015 >60  >60 mL/min Final   Comment: (NOTE) The eGFR has been calculated using the CKD EPI equation. This calculation has not been validated in all clinical situations. eGFR's persistently <60 mL/min signify possible Chronic Kidney Disease.   . Anion gap 02/23/2015 8  5 - 15 Final  . Troponin i, poc 02/23/2015 0.01  0.00 - 0.08 ng/mL Final  . Comment 3 02/23/2015          Final   Comment: Due to the release kinetics of cTnI, a negative result within the first hours of the onset of symptoms does not rule out myocardial infarction with certainty. If myocardial infarction is still suspected, repeat the test at appropriate intervals.   . Opiates 02/23/2015 NONE DETECTED  NONE DETECTED Final  . Cocaine 02/23/2015 NONE DETECTED  NONE DETECTED Final  . Benzodiazepines 02/23/2015 NONE DETECTED  NONE DETECTED Final  . Amphetamines 02/23/2015 NONE DETECTED  NONE DETECTED Final  . Tetrahydrocannabinol 02/23/2015 NONE DETECTED  NONE DETECTED Final  . Barbiturates 02/23/2015 NONE DETECTED  NONE DETECTED Final   Comment:        DRUG SCREEN FOR MEDICAL PURPOSES ONLY.  IF CONFIRMATION IS NEEDED FOR ANY PURPOSE, NOTIFY LAB WITHIN 5 DAYS.        LOWEST DETECTABLE LIMITS FOR URINE DRUG SCREEN Drug Class       Cutoff (ng/mL) Amphetamine      1000 Barbiturate      200 Benzodiazepine   016 Tricyclics       010 Opiates          300 Cocaine          300 THC              50   . Color, Urine 02/23/2015 YELLOW  YELLOW Final  . APPearance 02/23/2015 CLEAR  CLEAR Final  . Specific Gravity, Urine 02/23/2015  1.016  1.005 - 1.030 Final  . pH 02/23/2015 6.0  5.0 - 8.0 Final  . Glucose, UA 02/23/2015 NEGATIVE  NEGATIVE mg/dL Final  . Hgb urine dipstick 02/23/2015 MODERATE* NEGATIVE Final  . Bilirubin Urine 02/23/2015 NEGATIVE  NEGATIVE Final  . Ketones, ur 02/23/2015 NEGATIVE  NEGATIVE mg/dL Final  . Protein, ur 02/23/2015 30* NEGATIVE mg/dL Final  . Urobilinogen, UA 02/23/2015 0.2  0.0 - 1.0 mg/dL Final  . Nitrite 02/23/2015 NEGATIVE  NEGATIVE Final  . Leukocytes, UA 02/23/2015 NEGATIVE  NEGATIVE Final  . RBC / HPF 02/23/2015 7-10  <3 RBC/hpf Final  . Hgb A1c MFr Bld 02/23/2015 5.5  4.8 - 5.6 % Final   Comment: (NOTE)         Pre-diabetes: 5.7 - 6.4         Diabetes: >6.4         Glycemic control for adults with diabetes: <7.0   . Mean Plasma Glucose 02/23/2015 111   Final   Comment: (NOTE) Performed At: [facility] [place], [place] [id number] Lindon Romp MD [id number]   . Cholesterol 02/23/2015 240* 0 - 200 mg/dL Final  . Triglycerides 02/23/2015 134  <150 mg/dL Final  . HDL 02/23/2015 54  >40 mg/dL Final  . Total CHOL/HDL Ratio 02/23/2015 4.4   Final  . VLDL 02/23/2015 27  0 - 40 mg/dL Final  . LDL Cholesterol 02/23/2015 159* 0 - 99 mg/dL Final   Comment:        Total Cholesterol/HDL:CHD Risk Coronary Heart Disease Risk Table                     Men   Women  1/2 Average Risk   3.4   3.3  Average Risk       5.0   4.4  2 X Average Risk   9.6   7.1  3 X Average Risk  23.4   11.0        Use the calculated Patient Ratio above and the CHD Risk Table to determine the patient's CHD Risk.        ATP III CLASSIFICATION (LDL):  <100     mg/dL   Optimal  100-129  mg/dL   Near or Above                    Optimal  130-159  mg/dL   Borderline  160-189  mg/dL   High  >190     mg/dL   Very High   .  WBC 02/25/2015 7.0  4.0 - 10.5 K/uL Final  . RBC 02/25/2015 4.47  3.87 - 5.11 MIL/uL Final  . Hemoglobin 02/25/2015 13.5  12.0 - 15.0 g/dL Final  . HCT  02/25/2015 41.5  36.0 - 46.0 % Final  . MCV 02/25/2015 92.8  78.0 - 100.0 fL Final  . MCH 02/25/2015 30.2  26.0 - 34.0 pg Final  . MCHC 02/25/2015 32.5  30.0 - 36.0 g/dL Final  . RDW 02/25/2015 13.9  11.5 - 15.5 % Final  . Platelets 02/25/2015 152  150 - 400 K/uL Final  . Sodium 02/25/2015 140  135 - 145 mmol/L Final  . Potassium 02/25/2015 3.0* 3.5 - 5.1 mmol/L Final  . Chloride 02/25/2015 112* 101 - 111 mmol/L Final  . CO2 02/25/2015 18* 22 - 32 mmol/L Final  . Glucose, Bld 02/25/2015 80  70 - 99 mg/dL Final  . BUN 02/25/2015 18  6 - 20 mg/dL Final  . Creatinine, Ser 02/25/2015 0.67  0.44 - 1.00 mg/dL Final  . Calcium 02/25/2015 8.5* 8.9 - 10.3 mg/dL Final  . GFR calc non Af Amer 02/25/2015 >60  >60 mL/min Final  . GFR calc Af Amer 02/25/2015 >60  >60 mL/min Final   Comment: (NOTE) The eGFR has been calculated using the CKD EPI equation. This calculation has not been validated in all clinical situations. eGFR's persistently <60 mL/min signify possible Chronic Kidney Disease.   . Anion gap 02/25/2015 10  5 - 15 Final  . Sodium 02/26/2015 140  135 - 145 mmol/L Final  . Potassium 02/26/2015 3.7  3.5 - 5.1 mmol/L Final  . Chloride 02/26/2015 112* 101 - 111 mmol/L Final  . CO2 02/26/2015 16* 22 - 32 mmol/L Final  . Glucose, Bld 02/26/2015 76  65 - 99 mg/dL Final  . BUN 02/26/2015 18  6 - 20 mg/dL Final  . Creatinine, Ser 02/26/2015 0.78  0.44 - 1.00 mg/dL Final  . Calcium 02/26/2015 8.7* 8.9 - 10.3 mg/dL Final  . GFR calc non Af Amer 02/26/2015 >60  >60 mL/min Final  . GFR calc Af Amer 02/26/2015 >60  >60 mL/min Final   Comment: (NOTE) The eGFR has been calculated using the CKD EPI equation. This calculation has not been validated in all clinical situations. eGFR's persistently <60 mL/min signify possible Chronic Kidney Disease.   . Anion gap 02/26/2015 12  5 - 15 Final  Nursing Home on 01/22/2015  Component Date Value Ref Range Status  . Hemoglobin 01/20/2015 13.6  12.0 - 16.0  g/dL Final  . HCT 01/20/2015 41  36 - 46 % Final  . Platelets 01/20/2015 177  150 - 399 K/L Final  . WBC 01/20/2015 7.0   Final  . Glucose 01/20/2015 129   Final  . BUN 01/20/2015 22* 4 - 21 mg/dL Final  . Creatinine 01/20/2015 0.9  0.5 - 1.1 mg/dL Final  . Potassium 01/20/2015 3.3* 3.4 - 5.3 mmol/L Final  . Sodium 01/20/2015 142  137 - 147 mmol/L Final  . Alkaline Phosphatase 01/20/2015 74  25 - 125 U/L Final  . ALT 01/20/2015 8  7 - 35 U/L Final  . AST 01/20/2015 15  13 - 35 U/L Final  . Bilirubin, Total 01/20/2015 0.4   Final    Ct Head Wo Contrast  02/23/2015   CLINICAL DATA:  Bilateral lower extremity weakness  EXAM: CT HEAD WITHOUT CONTRAST  TECHNIQUE: Contiguous axial images were obtained from the base of the skull through the vertex without intravenous  contrast.  COMPARISON:  09/01/2014, 08/31/2014.  FINDINGS: There is no intracranial hemorrhage, mass or evidence of acute infarction. There is no extra-axial fluid collection. There is moderately severe generalized atrophy and hemispheric hypodensity consistent with chronic small vessel ischemic disease. There is prior lacunar infarction in the left caudate head. No acute bony abnormalities are evident. Incidentally noted right frontal sinus osteoma.  IMPRESSION: Moderately severe generalized atrophy and chronic small vessel ischemic disease. No acute findings.   Electronically Signed   By: Andreas Newport M.D.   On: 02/23/2015 01:35   Mr Brain Wo Contrast  02/23/2015   CLINICAL DATA:  Initial evaluation for acute aphasia, dysarthria. Gait instability.  EXAM: MRI HEAD WITHOUT CONTRAST  MRA HEAD WITHOUT CONTRAST  TECHNIQUE: Multiplanar, multiecho pulse sequences of the brain and surrounding structures were obtained without intravenous contrast. Angiographic images of the head were obtained using MRA technique without contrast.  COMPARISON:  Prior CT from earlier the same day as well as previous MRI from 09/01/2014.  FINDINGS: MRI HEAD  FINDINGS  Diffuse prominence of the CSF containing spaces is compatible with generalized cerebral atrophy. Extensive patchy and confluent T2/FLAIR hyperintensity within the periventricular and deep white matter both cerebral hemispheres again seen, most compatible with advanced chronic microvascular ischemic disease. Similar changes seen within the pons.  There are patchy multi focal ischemic infarcts involving the central pons, with some extension inferiorly towards the upper brainstem (series 3, image 15). Additional multi focal subcentimeter ischemic infarcts involving the right cerebellar hemisphere (series 3, image 13). A in 7 mm mildly hyperintense focus of restricted diffusion within the posterior right middle cerebellar peduncle appears more subacute in nature (series 3, image 11). Additional patchy subcentimeter ischemic infarcts within the medial left occipital lobe as well as the mid and left aspect of the splenium in the left PCA territory (series 3, image 24 image 18, 24). There is question of small subacute ischemic infarct within the left cerebellar hemisphere as well (series 3, image 9), as well as the left thalamus (series 3, image 25). There is question of tiny associated petechial hemorrhage within the central pons (series 9, image 8). No hemorrhagic transformation. No other chronic or acute intracranial hemorrhage. Absent flow void within the distal vertebral arteries and proximal -mid basilar artery.  No mass lesion or midline shift.  No extra-axial fluid collection.  Craniocervical junction within normal limits. Scattered multilevel degenerative changes present within the visualized upper cervical spine. Pituitary gland normal.  No acute abnormality about the orbits.  Scattered mild mucoperiosteal thickening present within the ethmoidal air cells and maxillary sinuses. No mastoid effusion. Inner ear structures normal.  Bone marrow signal intensity normal. Scalp soft tissues unremarkable.  MRA  HEAD FINDINGS  ANTERIOR CIRCULATION:  Visualized distal cervical segments of the internal carotid arteries are patent with antegrade flow. The petrous, cavernous, and supra clinoid segments are patent bilaterally. A1 segments are patent but demonstrate multi focal atheromatous irregularity. Multi focal air atheromatous irregularity present within the anterior cerebral arteries is well which are opacified to their distal aspects. There are superimposed moderate to severe multi focal stenoses within the left A2 segment.  M1 segments irregular but patent. Focal moderate stenosis within the right M1 segment noted. Irregular somewhat more prevalent within the right M1 segment. Distal MCA branches well opacified.  POSTERIOR CIRCULATION:  Flow was seen within the distal vertebral arteries within the upper neck. There is essentially absent flow within the distal V4 segments bilaterally as they course into the cranial  vault. Posterior inferior cerebral arteries not well opacified. There is little to no flow within the basilar artery as well. Posterior cerebral arteries not definitely visualized. Superior cerebellar arteries not visualized either. This is new relative to most recent MRA from 09/01/2014.  No aneurysm.  IMPRESSION: MRI HEAD IMPRESSION:  1. Patchy multi focal ischemic infarcts within the pons extending inferiorly towards the brainstem, with additional subcentimeter infarcts involving the right cerebellar hemisphere, and left PCA territory. There are probable more subacute infarcts within the right cerebellar hemisphere as well as the left cerebellar hemisphere and left thalamus. No significant mass effect. 2. Advanced cerebral atrophy with chronic microvascular ischemic disease.  MRA HEAD IMPRESSION:  1. Essentially absent flow within the distal vertebral arteries as they course into the cranial vault with absent flow within the basilar artery and its branches. This is new relative to prior study. 2. Patent  anterior circulation. There are moderate to severe multi focal atheromatous stenoses within the left A2 segment. Moderate short-segment stenosis within the mid right M1 segment. Multi focal atheromatous irregularity within the distal MCA branches. Results were called by telephone at the time of interpretation on 02/23/2015 at 6:30 am to Dr. Jennette Kettle , who verbally acknowledged these results.   Electronically Signed   By: Jeannine Boga M.D.   On: 02/23/2015 06:42   Mr Jodene Nam Head/brain Wo Cm  02/23/2015   CLINICAL DATA:  Initial evaluation for acute aphasia, dysarthria. Gait instability.  EXAM: MRI HEAD WITHOUT CONTRAST  MRA HEAD WITHOUT CONTRAST  TECHNIQUE: Multiplanar, multiecho pulse sequences of the brain and surrounding structures were obtained without intravenous contrast. Angiographic images of the head were obtained using MRA technique without contrast.  COMPARISON:  Prior CT from earlier the same day as well as previous MRI from 09/01/2014.  FINDINGS: MRI HEAD FINDINGS  Diffuse prominence of the CSF containing spaces is compatible with generalized cerebral atrophy. Extensive patchy and confluent T2/FLAIR hyperintensity within the periventricular and deep white matter both cerebral hemispheres again seen, most compatible with advanced chronic microvascular ischemic disease. Similar changes seen within the pons.  There are patchy multi focal ischemic infarcts involving the central pons, with some extension inferiorly towards the upper brainstem (series 3, image 15). Additional multi focal subcentimeter ischemic infarcts involving the right cerebellar hemisphere (series 3, image 13). A in 7 mm mildly hyperintense focus of restricted diffusion within the posterior right middle cerebellar peduncle appears more subacute in nature (series 3, image 11). Additional patchy subcentimeter ischemic infarcts within the medial left occipital lobe as well as the mid and left aspect of the splenium in the left PCA  territory (series 3, image 24 image 18, 24). There is question of small subacute ischemic infarct within the left cerebellar hemisphere as well (series 3, image 9), as well as the left thalamus (series 3, image 25). There is question of tiny associated petechial hemorrhage within the central pons (series 9, image 8). No hemorrhagic transformation. No other chronic or acute intracranial hemorrhage. Absent flow void within the distal vertebral arteries and proximal -mid basilar artery.  No mass lesion or midline shift.  No extra-axial fluid collection.  Craniocervical junction within normal limits. Scattered multilevel degenerative changes present within the visualized upper cervical spine. Pituitary gland normal.  No acute abnormality about the orbits.  Scattered mild mucoperiosteal thickening present within the ethmoidal air cells and maxillary sinuses. No mastoid effusion. Inner ear structures normal.  Bone marrow signal intensity normal. Scalp soft tissues unremarkable.  MRA HEAD FINDINGS  ANTERIOR CIRCULATION:  Visualized distal cervical segments of the internal carotid arteries are patent with antegrade flow. The petrous, cavernous, and supra clinoid segments are patent bilaterally. A1 segments are patent but demonstrate multi focal atheromatous irregularity. Multi focal air atheromatous irregularity present within the anterior cerebral arteries is well which are opacified to their distal aspects. There are superimposed moderate to severe multi focal stenoses within the left A2 segment.  M1 segments irregular but patent. Focal moderate stenosis within the right M1 segment noted. Irregular somewhat more prevalent within the right M1 segment. Distal MCA branches well opacified.  POSTERIOR CIRCULATION:  Flow was seen within the distal vertebral arteries within the upper neck. There is essentially absent flow within the distal V4 segments bilaterally as they course into the cranial vault. Posterior inferior cerebral  arteries not well opacified. There is little to no flow within the basilar artery as well. Posterior cerebral arteries not definitely visualized. Superior cerebellar arteries not visualized either. This is new relative to most recent MRA from 09/01/2014.  No aneurysm.  IMPRESSION: MRI HEAD IMPRESSION:  1. Patchy multi focal ischemic infarcts within the pons extending inferiorly towards the brainstem, with additional subcentimeter infarcts involving the right cerebellar hemisphere, and left PCA territory. There are probable more subacute infarcts within the right cerebellar hemisphere as well as the left cerebellar hemisphere and left thalamus. No significant mass effect. 2. Advanced cerebral atrophy with chronic microvascular ischemic disease.  MRA HEAD IMPRESSION:  1. Essentially absent flow within the distal vertebral arteries as they course into the cranial vault with absent flow within the basilar artery and its branches. This is new relative to prior study. 2. Patent anterior circulation. There are moderate to severe multi focal atheromatous stenoses within the left A2 segment. Moderate short-segment stenosis within the mid right M1 segment. Multi focal atheromatous irregularity within the distal MCA branches. Results were called by telephone at the time of interpretation on 02/23/2015 at 6:30 am to Dr. Jennette Kettle , who verbally acknowledged these results.   Electronically Signed   By: Jeannine Boga M.D.   On: 02/23/2015 06:42     Assessment/Plan  1. Acute CVA (cerebrovascular accident) Patient is unlikely to do well following this latest CVA. This is the second CVA in 6 months. She is withdrawn and not speaking today. She has not been walking since this latest CVA.  2. Cerebrovascular disease Multiple stroke areas in the pons  3. Dysphagia, pharyngoesophageal phase Interferes with adequate oral intake. She is on less than 50% intake.  4. Dysarthria Interferes with speech  5.  Aphasia Interferes with expressing herself  6. Essential hypertension Controlled  7. Hyperlipidemia At this late point, there is little to be done about this. It does remain slightly high.  8. Memory deficits Adding to her confusion.

## 2015-03-18 DEATH — deceased

## 2015-10-08 ENCOUNTER — Ambulatory Visit (INDEPENDENT_AMBULATORY_CARE_PROVIDER_SITE_OTHER): Payer: Medicare Other | Admitting: Ophthalmology

## 2016-10-25 IMAGING — CT CT HEAD W/O CM
2 series · 16 of 30 positions shown, 20 images · non-contrast
Comparison: None.

CLINICAL DATA: Right leg weakness and slurred speech. New onset of
dysphagia.

EXAM:
CT HEAD WITHOUT CONTRAST
TECHNIQUE: Contiguous axial images were obtained from the base of the skull
through the vertex without intravenous contrast.

[Series 2: head w/o · axial · non-contrast · 0.45mm/px · z∈[-148,-28]mm · 13 of 28 slices shown, 17 images]
[im 2/28  brain]
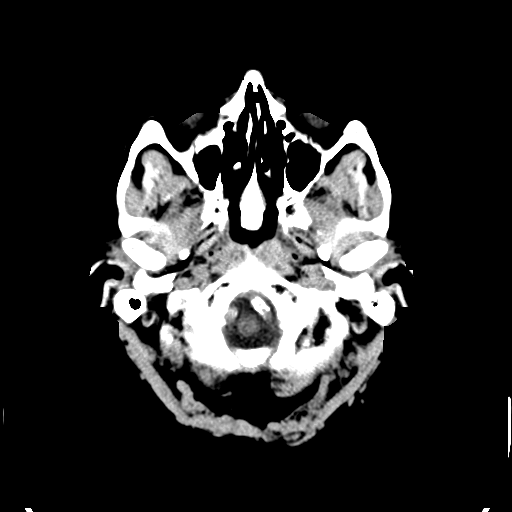
[im 2/28  bone]
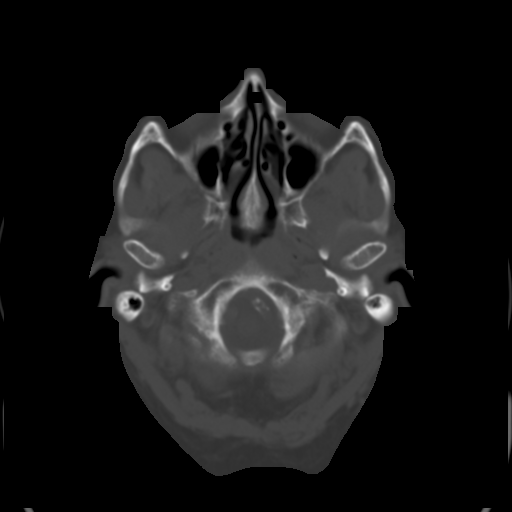
[im 4/28  brain]
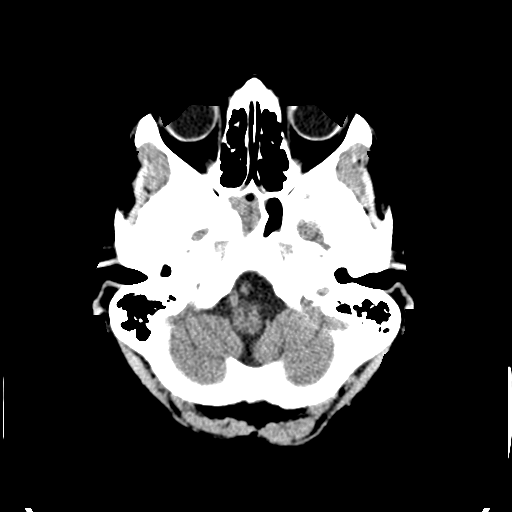
[im 6/28  brain]
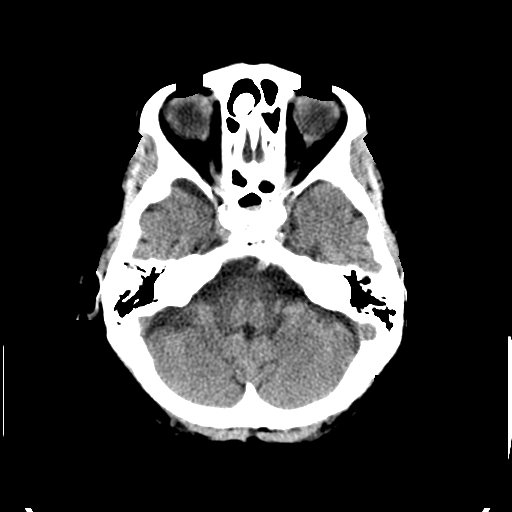
[im 8/28  brain]
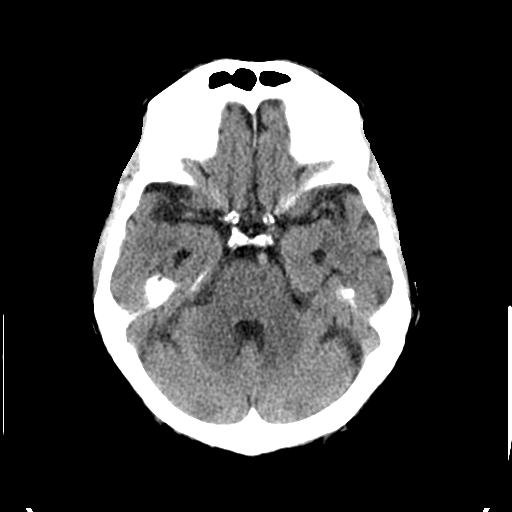
[im 10/28  brain]
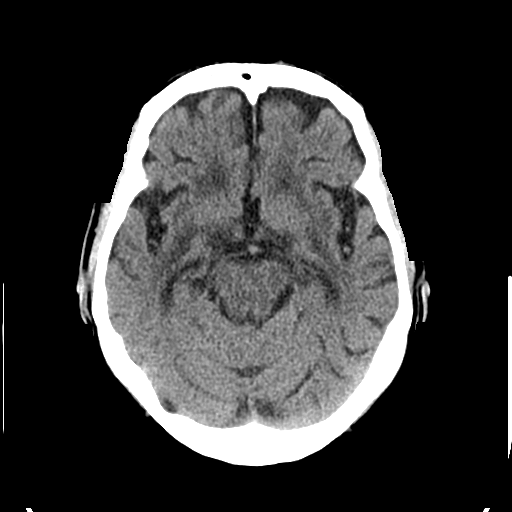
[im 10/28  bone]
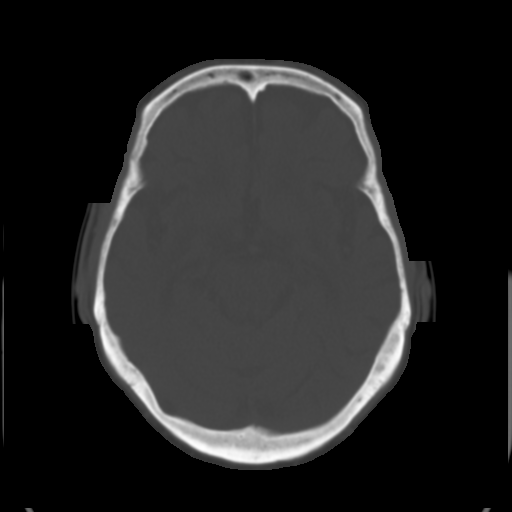
[im 12/28  brain]
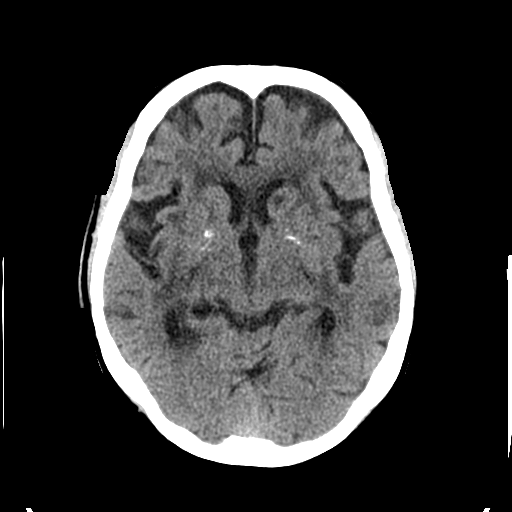
[im 14/28  brain]
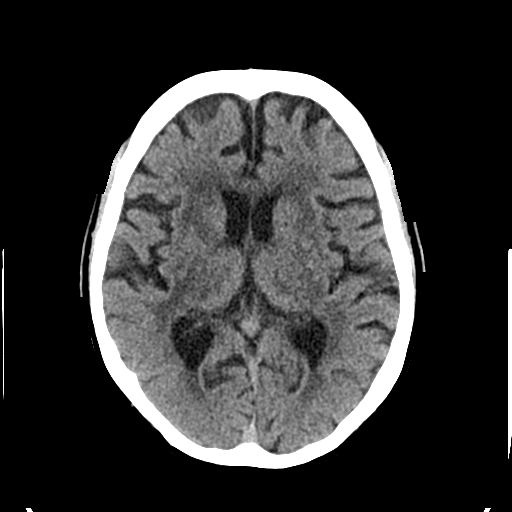
[im 16/28  brain]
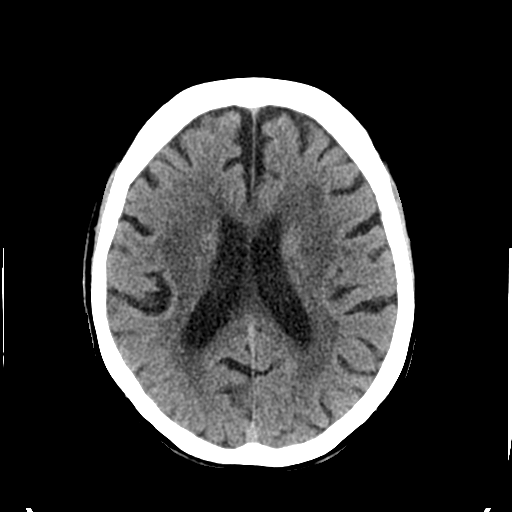
[im 18/28  brain]
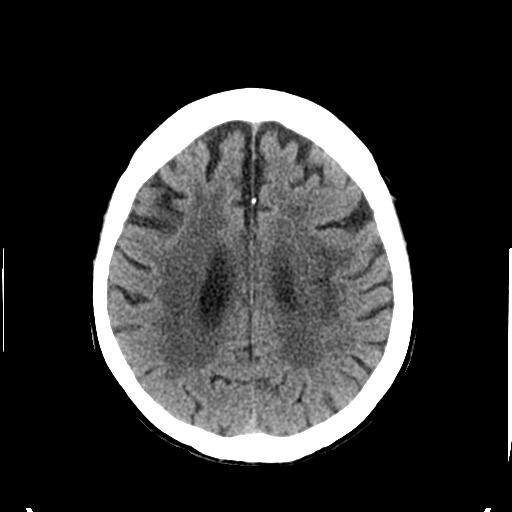
[im 18/28  bone]
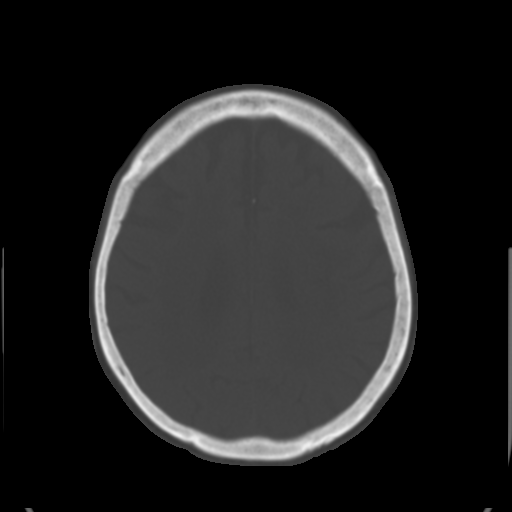
[im 20/28  brain]
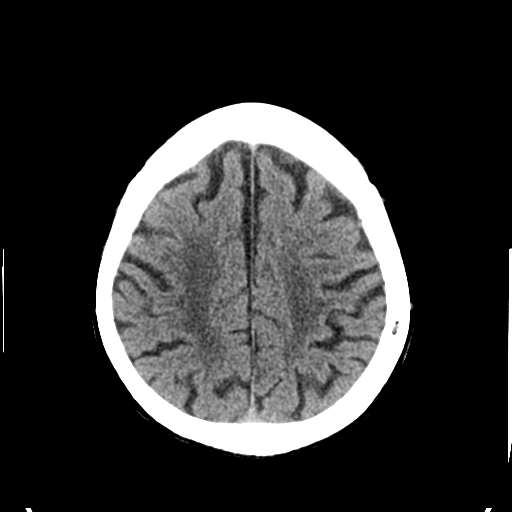
[im 22/28  brain]
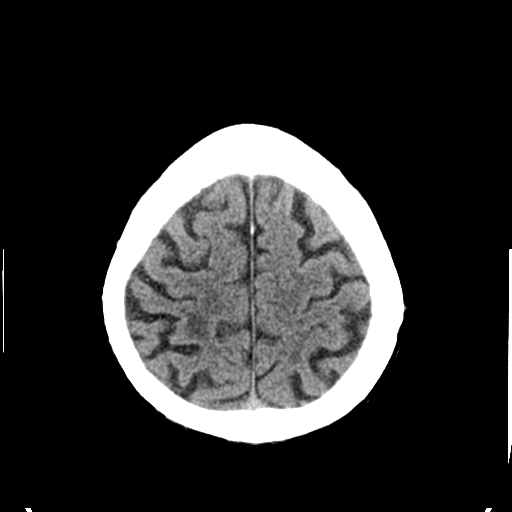
[im 24/28  brain]
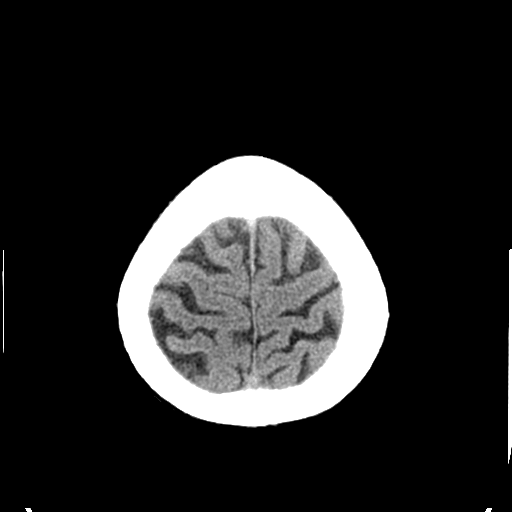
[im 26/28  brain]
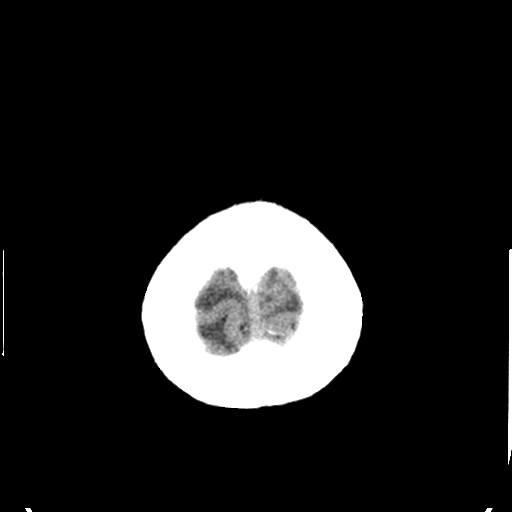
[im 26/28  bone]
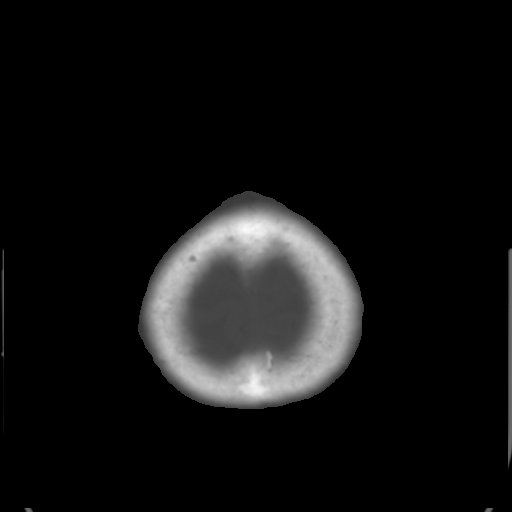

[Series 3: bone windows · axial · 0.45mm/px · z∈[-148,-108]mm · 3 of 28 slices shown]
[im 2/28  bone]
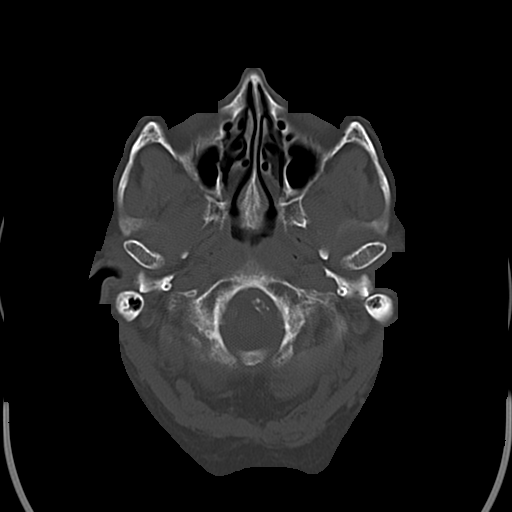
[im 6/28  bone]
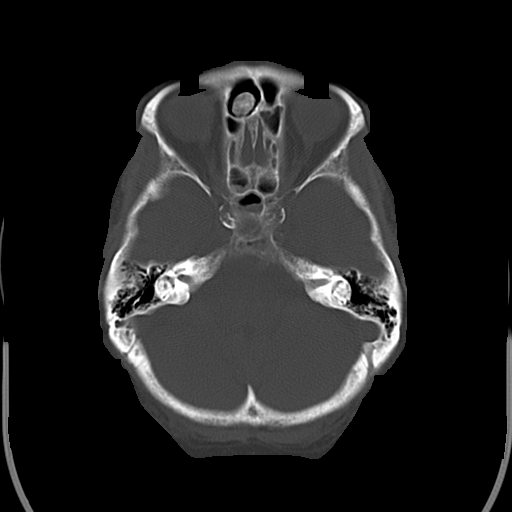
[im 10/28  bone]
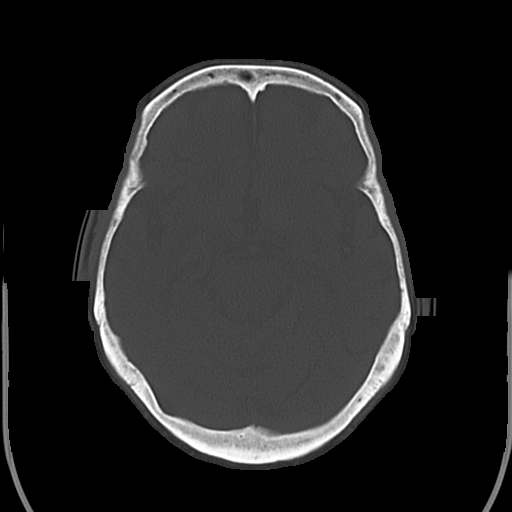

[16 of 30 positions shown; findings below may reference images not displayed]

FINDINGS: Sinuses/Soft tissues: Mucosal thickening of the sphenoid sinuses.
Clear mastoid air cells.

Intracranial: Expected cerebral atrophy. Moderate to marked low
density in the periventricular white matter likely related to small
vessel disease. Physiologic calcifications within the basal ganglia.
Remote lacunar infarct in left caudate head. No mass lesion,
hemorrhage, hydrocephalus, acute infarct, intra-axial, or
extra-axial fluid collection.

Vertebral and carotid atherosclerosis.
IMPRESSION: 1.  No acute intracranial abnormality.
2.  Cerebral atrophy and small vessel ischemic change.
3. Sinus disease.
